# Patient Record
Sex: Female | Born: 1960 | Race: White | Hispanic: No | Marital: Married | Smoking: Never smoker
Health system: Southern US, Community
[De-identification: ages and names within clinical notes are randomized; demographics above are authoritative.]

## PROBLEM LIST (undated history)

## (undated) DIAGNOSIS — M1990 Unspecified osteoarthritis, unspecified site: Secondary | ICD-10-CM

## (undated) DIAGNOSIS — O24419 Gestational diabetes mellitus in pregnancy, unspecified control: Secondary | ICD-10-CM

## (undated) DIAGNOSIS — F419 Anxiety disorder, unspecified: Secondary | ICD-10-CM

## (undated) DIAGNOSIS — J45909 Unspecified asthma, uncomplicated: Secondary | ICD-10-CM

## (undated) DIAGNOSIS — K582 Mixed irritable bowel syndrome: Secondary | ICD-10-CM

## (undated) DIAGNOSIS — K589 Irritable bowel syndrome without diarrhea: Secondary | ICD-10-CM

## (undated) DIAGNOSIS — K219 Gastro-esophageal reflux disease without esophagitis: Secondary | ICD-10-CM

## (undated) HISTORY — PX: WISDOM TOOTH EXTRACTION: SHX21

## (undated) HISTORY — DX: Mixed irritable bowel syndrome: K58.2

## (undated) HISTORY — DX: Irritable bowel syndrome, unspecified: K58.9

## (undated) HISTORY — DX: Gastro-esophageal reflux disease without esophagitis: K21.9

## (undated) HISTORY — PX: TUBAL LIGATION: SHX77

## (undated) HISTORY — PX: BREAST EXCISIONAL BIOPSY: SUR124

## (undated) HISTORY — PX: REPLACEMENT TOTAL KNEE: SUR1224

---

## 2007-08-19 ENCOUNTER — Emergency Department (HOSPITAL_COMMUNITY): Admission: EM | Admit: 2007-08-19 | Discharge: 2007-08-19 | Payer: Self-pay | Admitting: Emergency Medicine

## 2015-04-05 ENCOUNTER — Encounter (HOSPITAL_COMMUNITY): Payer: Self-pay | Admitting: Emergency Medicine

## 2015-04-05 ENCOUNTER — Emergency Department (HOSPITAL_COMMUNITY)
Admission: EM | Admit: 2015-04-05 | Discharge: 2015-04-05 | Disposition: A | Discharge status: Home / Self Care | Payer: Self-pay | Attending: Emergency Medicine | Admitting: Emergency Medicine

## 2015-04-05 DIAGNOSIS — J45909 Unspecified asthma, uncomplicated: Secondary | ICD-10-CM | POA: Insufficient documentation

## 2015-04-05 DIAGNOSIS — K047 Periapical abscess without sinus: Secondary | ICD-10-CM | POA: Insufficient documentation

## 2015-04-05 DIAGNOSIS — R220 Localized swelling, mass and lump, head: Secondary | ICD-10-CM

## 2015-04-05 DIAGNOSIS — K029 Dental caries, unspecified: Secondary | ICD-10-CM | POA: Insufficient documentation

## 2015-04-05 DIAGNOSIS — M1990 Unspecified osteoarthritis, unspecified site: Secondary | ICD-10-CM | POA: Insufficient documentation

## 2015-04-05 HISTORY — DX: Unspecified osteoarthritis, unspecified site: M19.90

## 2015-04-05 HISTORY — DX: Unspecified asthma, uncomplicated: J45.909

## 2015-04-05 MED ORDER — HYDROCODONE-ACETAMINOPHEN 5-325 MG PO TABS: 2.0000 | ORAL_TABLET | ORAL | Status: DC | PRN

## 2015-04-05 MED ORDER — CLINDAMYCIN HCL 300 MG PO CAPS: 300.0000 mg | ORAL_CAPSULE | Freq: Three times a day (TID) | ORAL | Status: DC

## 2015-04-05 MED ORDER — CLINDAMYCIN HCL 150 MG PO CAPS
300.0000 mg | ORAL_CAPSULE | Freq: Once | ORAL | Status: AC
  Administered 2015-04-05: 300 mg via ORAL
  Filled 2015-04-05: qty 2

## 2015-04-05 MED ORDER — HYDROCODONE-ACETAMINOPHEN 5-325 MG PO TABS
1.0000 | ORAL_TABLET | Freq: Once | ORAL | Status: AC
Start: 2015-04-05 — End: 2015-04-05
  Administered 2015-04-05: 1 via ORAL
  Filled 2015-04-05: qty 1

## 2015-04-05 NOTE — Discharge Instructions (Signed)
Facial Infection °You have an infection of your face. This requires special attention to help prevent serious problems. Infections in facial wounds can cause poor healing and scars. They can also spread to deeper tissues, especially around the eye. Wound and dental infections can lead to sinusitis, infection of the eye socket, and even meningitis. Permanent damage to the skin, eye, and nervous system may result if facial infections are not treated properly. With severe infections, hospital care for IV antibiotic injections may be needed if they don't respond to oral antibiotics. °Antibiotics must be taken for the full course to insure the infection is eliminated. If the infection came from a bad tooth, it may have to be extracted when the infection is under control. Warm compresses may be applied to reduce skin irritation and remove drainage. °You might need a tetanus shot now if: °· You cannot remember when your last tetanus shot was. °· You have never had a tetanus shot. °· The object that caused your wound was dirty. °If you need a tetanus shot, and you decide not to get one, there is a rare chance of getting tetanus. Sickness from tetanus can be serious. If you got a tetanus shot, your arm may swell, get red and warm to the touch at the shot site. This is common and not a problem. °SEEK IMMEDIATE MEDICAL CARE IF:  °· You have increased swelling, redness, or trouble breathing. °· You have a severe headache, dizziness, nausea, or vomiting. °· You develop problems with your eyesight. °· You have a fever. °Document Released: 08/12/2004 Document Revised: 09/27/2011 Document Reviewed: 07/05/2005 °ExitCare® Patient Information ©2015 [employer]. This information is not intended to replace advice given to you by your health care provider. Make sure you discuss any questions you have with your health care provider. ° °

## 2015-04-05 NOTE — ED Notes (Signed)
Patient with intermittent jaw swelling/pain since Tuesday. States teeth do not hurt, but has poor dentition. Patient denies trouble swallowing, trouble speaking. Afebrile.

## 2015-04-05 NOTE — ED Provider Notes (Signed)
CSN: [id number]     Arrival date & time 04/05/15  0865 History  This chart was scribed for Rolland Porter, MD by Ronney Lion, ED Scribe. This patient was seen in room APA12/APA12 and the patient's care was started at 8:37 AM.    Chief Complaint  Patient presents with  . Jaw Pain   The history is provided by the patient. No language interpreter was used.   HPI Comments: Kathy Perry is a 54 y.o. female who presents to the Emergency Department complaining of intermittent left-sided jaw swelling, occasionally radiating to her left ear, that began 4 days ago. She endorses an associated subjective fever, stating that she occasionally feels hot and diaphoretic, for which she has taken Motrin. Patient has known allergies to codeine.  Past Medical History  Diagnosis Date  . Arthritis   . Asthma    Past Surgical History  Procedure Laterality Date  . Tubal ligation    . Wisdom tooth extraction     No family history on file. Social History  Substance Use Topics  . Smoking status: Passive Smoke Exposure - Never Smoker  . Smokeless tobacco: None  . Alcohol Use: No   OB History    No data available     Review of Systems  Constitutional: Positive for fever (subjective). Negative for chills, diaphoresis, appetite change and fatigue.  HENT: Positive for facial swelling. Negative for mouth sores, sore throat and trouble swallowing.   Eyes: Negative for visual disturbance.  Respiratory: Negative for cough, chest tightness, shortness of breath and wheezing.   Cardiovascular: Negative for chest pain.  Gastrointestinal: Negative for nausea, vomiting, abdominal pain, diarrhea and abdominal distention.  Endocrine: Negative for polydipsia, polyphagia and polyuria.  Genitourinary: Negative for dysuria, frequency and hematuria.  Musculoskeletal: Negative for gait problem.  Skin: Negative for color change, pallor and rash.  Neurological: Negative for dizziness, syncope, light-headedness and headaches.   Hematological: Does not bruise/bleed easily.  Psychiatric/Behavioral: Negative for behavioral problems and confusion.      Allergies  Codeine  Home Medications   Prior to Admission medications   Medication Sig Start Date End Date Taking? Authorizing Provider  clindamycin (CLEOCIN) 300 MG capsule Take 1 capsule (300 mg total) by mouth 3 (three) times daily. 04/05/15   Rolland Porter, MD  HYDROcodone-acetaminophen (NORCO/VICODIN) 5-325 MG per tablet Take 2 tablets by mouth every 4 (four) hours as needed. 04/05/15   Rolland Porter, MD   BP 147/91 mmHg  Pulse 102  Temp(Src) 98.6 F (37 C) (Oral)  Resp 16  Ht 5' 5.5" (1.664 m)  Wt 180 lb (81.647 kg)  BMI 29.49 kg/m2  SpO2 99%  LMP  (LMP Unknown) Physical Exam  Constitutional: She is oriented to person, place, and time. She appears well-developed and well-nourished. No distress.  HENT:  Head: Normocephalic and atraumatic.  Obvious cavity of second mandibular molar on left, with surrounding gingival swelling. However, most swelling appears to be in the left maxilla. Swelling along left jaw.  Eyes: Conjunctivae and EOM are normal. Pupils are equal, round, and reactive to light. No scleral icterus.  Neck: Normal range of motion. Neck supple. No tracheal deviation present. No thyromegaly present.  Normal neck, no swelling.  Cardiovascular: Normal rate and regular rhythm.  Exam reveals no gallop and no friction rub.   No murmur heard. Pulmonary/Chest: Effort normal and breath sounds normal. No respiratory distress. She has no wheezes. She has no rales.  Abdominal: Soft. Bowel sounds are normal. She exhibits no distension.  There is no tenderness. There is no rebound.  Musculoskeletal: Normal range of motion.  Neurological: She is alert and oriented to person, place, and time.  Skin: Skin is warm and dry. No rash noted.  Psychiatric: She has a normal mood and affect. Her behavior is normal.  Nursing note and vitals reviewed.   ED Course   Procedures (including critical care time)  DIAGNOSTIC STUDIES: Oxygen Saturation is 99% on RA, normal by my interpretation.    COORDINATION OF CARE: 8:40 AM - Discussed treatment plan with pt at bedside. Pt verbalized understanding and agreed to plan.   MDM   Final diagnoses:  Dental infection  Facial swelling    No obvious abscess. Plan will be clindamycin out of Vicodin. Continue oral hygiene. Recheck with failure to improve.  I personally performed the services described in this documentation, which was scribed in my presence. The recorded information has been reviewed and is accurate.     Rolland Porter, MD 04/05/15 [phone]

## 2018-01-08 ENCOUNTER — Other Ambulatory Visit: Payer: Self-pay

## 2018-01-08 ENCOUNTER — Emergency Department (HOSPITAL_COMMUNITY): Payer: Medicaid Other

## 2018-01-08 ENCOUNTER — Emergency Department (HOSPITAL_COMMUNITY)
Admission: EM | Admit: 2018-01-08 | Discharge: 2018-01-08 | Disposition: A | Discharge status: Home / Self Care | Payer: Medicaid Other | Attending: Emergency Medicine | Admitting: Emergency Medicine

## 2018-01-08 ENCOUNTER — Encounter (HOSPITAL_COMMUNITY): Payer: Self-pay | Admitting: Emergency Medicine

## 2018-01-08 DIAGNOSIS — Z7722 Contact with and (suspected) exposure to environmental tobacco smoke (acute) (chronic): Secondary | ICD-10-CM | POA: Diagnosis not present

## 2018-01-08 DIAGNOSIS — I442 Atrioventricular block, complete: Secondary | ICD-10-CM

## 2018-01-08 DIAGNOSIS — J45909 Unspecified asthma, uncomplicated: Secondary | ICD-10-CM | POA: Diagnosis not present

## 2018-01-08 DIAGNOSIS — R002 Palpitations: Secondary | ICD-10-CM | POA: Insufficient documentation

## 2018-01-08 DIAGNOSIS — I498 Other specified cardiac arrhythmias: Secondary | ICD-10-CM | POA: Diagnosis not present

## 2018-01-08 HISTORY — DX: Gestational diabetes mellitus in pregnancy, unspecified control: O24.419

## 2018-01-08 LAB — I-STAT TROPONIN, ED: TROPONIN I, POC: 0 ng/mL (ref 0.00–0.08)

## 2018-01-08 LAB — CBC WITH DIFFERENTIAL/PLATELET
BASOS ABS: 0.1 10*3/uL (ref 0.0–0.1)
Basophils Relative: 2 %
EOS ABS: 0.2 10*3/uL (ref 0.0–0.7)
EOS PCT: 3 %
HCT: 45 % (ref 36.0–46.0)
Hemoglobin: 14.8 g/dL (ref 12.0–15.0)
LYMPHS PCT: 28 %
Lymphs Abs: 1.5 10*3/uL (ref 0.7–4.0)
MCH: 29.2 pg (ref 26.0–34.0)
MCHC: 32.9 g/dL (ref 30.0–36.0)
MCV: 88.9 fL (ref 78.0–100.0)
Monocytes Absolute: 0.3 10*3/uL (ref 0.1–1.0)
Monocytes Relative: 6 %
Neutro Abs: 3.2 10*3/uL (ref 1.7–7.7)
Neutrophils Relative %: 61 %
PLATELETS: 139 10*3/uL — AB (ref 150–400)
RBC: 5.06 MIL/uL (ref 3.87–5.11)
RDW: 13.3 % (ref 11.5–15.5)
WBC: 5.3 10*3/uL (ref 4.0–10.5)

## 2018-01-08 LAB — BASIC METABOLIC PANEL
Anion gap: 6 (ref 5–15)
BUN: 18 mg/dL (ref 6–20)
CO2: 28 mmol/L (ref 22–32)
Calcium: 9.4 mg/dL (ref 8.9–10.3)
Chloride: 106 mmol/L (ref 101–111)
Creatinine, Ser: 0.98 mg/dL (ref 0.44–1.00)
GFR calc Af Amer: >60 mL/min (ref >60)
Glucose, Bld: 92 mg/dL (ref 65–99)
POTASSIUM: 4 mmol/L (ref 3.5–5.1)
SODIUM: 140 mmol/L (ref 135–145)

## 2018-01-08 LAB — TSH: TSH: 0.704 u[IU]/mL (ref 0.350–4.500)

## 2018-01-08 LAB — MAGNESIUM: MAGNESIUM: 2 mg/dL (ref 1.7–2.4)

## 2018-01-08 MED ORDER — SODIUM CHLORIDE 0.9 % IV BOLUS
500.0000 mL | Freq: Once | INTRAVENOUS | Status: AC
  Administered 2018-01-08: 500 mL via INTRAVENOUS

## 2018-01-08 MED ORDER — LORAZEPAM 2 MG/ML IJ SOLN: 0.5000 mg | Freq: Once | INTRAMUSCULAR | Status: DC

## 2018-01-08 NOTE — ED Notes (Signed)
IVF infusing

## 2018-01-08 NOTE — ED Notes (Signed)
Pt to rad

## 2018-01-08 NOTE — ED Notes (Signed)
Awaiting completion of fluids

## 2018-01-08 NOTE — ED Triage Notes (Signed)
Patient c/o intermittent rapid heart beat that started yesterday. Denies any shortness of breath or chest pain. Patient does report tremors and weakness. Denies any cardiac hx. Per patient has been taking medications for multiple days due to headache.

## 2018-01-08 NOTE — ED Notes (Signed)
Pt reports that she feels like her heart is racing  Reports she was not "doing anything" when she felt it racing Has happened in the past  Has not seen a physician due to report os having no physician  IV, labs

## 2018-01-08 NOTE — Discharge Instructions (Signed)
You were seen in the ED today with heart palpitations. Call to make an appointment with the Cardiologist listed. Return to the ED with any chest pain, difficulty breathing, passing out, lightheadedness, or worsening palpitations. Cut back on caffeine and drink more water.

## 2018-01-08 NOTE — ED Provider Notes (Signed)
Emergency Department Provider Note   I have reviewed the triage vital signs and the nursing notes.   HISTORY  Chief Complaint Tachycardia   HPI Kathy Perry is a 57 y.o. female with PMH of arthritis and asthma's to the emergency department for evaluation of intermittent heart palpitations.  She has had occasional symptoms in the past but since yesterday has had more frequent and severe symptoms.  She denies any associated chest pain or shortness of breath.  She feels some generalized weakness at times.  She did have a headache earlier in the week and was taking a lot of Goody's powder along with other over-the-counter medications and the headache is now gone.  She has no prior evaluation for her palpitations.  She states she does drink a lot of caffeinated soda not good about drinking water.   Past Medical History:  Diagnosis Date  . Arthritis   . Asthma   . Gestational diabetes     There are no active problems to display for this patient.   Past Surgical History:  Procedure Laterality Date  . TUBAL LIGATION    . WISDOM TOOTH EXTRACTION      Allergies Codeine  Family History  Problem Relation Age of Onset  . Diabetes Father     Social History Social History   Tobacco Use  . Smoking status: Passive Smoke Exposure - Never Smoker  . Smokeless tobacco: Never Used  Substance Use Topics  . Alcohol use: No  . Drug use: No    Review of Systems  Constitutional: No fever/chills Eyes: No visual changes. ENT: No sore throat. Cardiovascular: Denies chest pain. Positive heart palpitations.  Respiratory: Denies shortness of breath. Gastrointestinal: No abdominal pain.  No nausea, no vomiting.  No diarrhea.  No constipation. Genitourinary: Negative for dysuria. Musculoskeletal: Negative for back pain. Skin: Negative for rash. Neurological: Negative for headaches, focal weakness or numbness.  10-point ROS otherwise  negative.  ____________________________________________   PHYSICAL EXAM:  VITAL SIGNS: ED Triage Vitals  Enc Vitals Group     BP 01/08/18 1432 (!) 153/95     Pulse Rate 01/08/18 1432 85     Resp 01/08/18 1432 18     Temp 01/08/18 1432 98.1 F (36.7 C)     Temp Source 01/08/18 1432 Oral     SpO2 01/08/18 1432 100 %     Weight 01/08/18 1429 180 lb (81.6 kg)     Height 01/08/18 1429 5' 5.5" (1.664 m)     Pain Score 01/08/18 1428 1   Constitutional: Alert and oriented. Well appearing and in no acute distress. Eyes: Conjunctivae are normal. Head: Atraumatic. Nose: No congestion/rhinnorhea. Mouth/Throat: Mucous membranes are moist.  Oropharynx non-erythematous. Neck: No stridor.  Cardiovascular: Normal rate, regular rhythm. Good peripheral circulation. Grossly normal heart sounds.   Respiratory: Normal respiratory effort.  No retractions. Lungs CTAB. Gastrointestinal: Soft and nontender. No distention.  Musculoskeletal: No lower extremity tenderness nor edema. No gross deformities of extremities. Neurologic:  Normal speech and language. No gross focal neurologic deficits are appreciated.  Skin:  Skin is warm, dry and intact. No rash noted.  ____________________________________________   LABS (all labs ordered are listed, but only abnormal results are displayed)  Labs Reviewed  CBC WITH DIFFERENTIAL/PLATELET - Abnormal; Notable for the following components:      Result Value   Platelets 139 (*)    All other components within normal limits  BASIC METABOLIC PANEL  TSH  MAGNESIUM  I-STAT TROPONIN, ED  ____________________________________________  EKG   EKG Interpretation  Date/Time:  Sunday January 08 2018 14:33:04 EDT Ventricular Rate:  82 PR Interval:    QRS Duration: 107 QT Interval:  379 QTC Calculation: 443 R Axis:   26 Text Interpretation:  Sinus rhythm Abnormal R-wave progression, early transition No STEMI.  Confirmed by Alona BeneLong, Dilpreet Faires [phone] on 01/08/2018  3:06:34 PM         ____________________________________________  RADIOLOGY  Dg Chest 2 View  Result Date: 01/08/2018 CLINICAL DATA:  Heart palpitations and strange feeling in throat. EXAM: CHEST - 2 VIEW COMPARISON:  None. FINDINGS: Lungs are adequately inflated without focal consolidation or effusion. Cardiomediastinal silhouette is within normal. There is mild biphasic curvature of the thoracolumbar spine. Minimal degenerate change of the spine. IMPRESSION: No active cardiopulmonary disease. Electronically Signed   By: Elberta Fortisaniel  Boyle M.D.   On: 01/08/2018 15:54    ____________________________________________   PROCEDURES  Procedure(s) performed:   Procedures  None ____________________________________________   INITIAL IMPRESSION / ASSESSMENT AND PLAN / ED COURSE  Pertinent labs & imaging results that were available during my care of the patient were reviewed by me and considered in my medical decision making (see chart for details).  Patient presents to the emergency department with frequent heart palpitations worsening since yesterday.  She has a normal EKG on arrival but when I am in the room she has frequent PVCs with occasional episodes where she will have up to 7 or 8 PVCs in a row.  She feels symptomatic during this episode and it was captured on telemetry.  04:22 PM Reviewed the patient's EKG and telemetry strip with Dr. Donnie Ahoilley, with cardiology.  His interpretation as the patient is having a PVC followed by an idioventricular rhythm.  The rate is slow.  Not concerning for ventricular tachycardia.  Labs reviewed with no acute findings.  TSH is normal.  Chest x-ray with no acute findings.  Plan for close cardiology follow-up.   At this time, I do not feel there is any life-threatening condition present. I have reviewed and discussed all results (EKG, imaging, lab, urine as appropriate), exam findings with patient. I have reviewed nursing notes and appropriate previous  records.  I feel the patient is safe to be discharged home without further emergent workup. Discussed usual and customary return precautions. Patient and family (if present) verbalize understanding and are comfortable with this plan.  Patient will follow-up with their primary care provider. If they do not have a primary care provider, information for follow-up has been provided to them. All questions have been answered.  ____________________________________________  FINAL CLINICAL IMPRESSION(S) / ED DIAGNOSES  Final diagnoses:  Palpitations  Idioventricular rhythm (HCC)     MEDICATIONS GIVEN DURING THIS VISIT:  Medications  sodium chloride 0.9 % bolus 500 mL (500 mLs Intravenous New Bag/Given 01/08/18 1546)     Note:  This document was prepared using Dragon voice recognition software and may include unintentional dictation errors.  Alona BeneJoshua Vivien Barretto, MD Emergency Medicine    Savva Beamer, Arlyss RepressJoshua G, MD 01/08/18 [phone]

## 2018-01-13 ENCOUNTER — Encounter: Payer: Self-pay | Admitting: Cardiovascular Disease

## 2018-01-13 ENCOUNTER — Ambulatory Visit (INDEPENDENT_AMBULATORY_CARE_PROVIDER_SITE_OTHER): Payer: Medicaid Other | Admitting: Cardiovascular Disease

## 2018-01-13 VITALS — BP 139/70 | HR 86 | Ht 65.5 in | Wt 184.0 lb

## 2018-01-13 DIAGNOSIS — R002 Palpitations: Secondary | ICD-10-CM | POA: Diagnosis not present

## 2018-01-13 DIAGNOSIS — I442 Atrioventricular block, complete: Secondary | ICD-10-CM | POA: Diagnosis not present

## 2018-01-13 DIAGNOSIS — Z9289 Personal history of other medical treatment: Secondary | ICD-10-CM

## 2018-01-13 DIAGNOSIS — I493 Ventricular premature depolarization: Secondary | ICD-10-CM | POA: Diagnosis not present

## 2018-01-13 NOTE — Patient Instructions (Addendum)
Your physician wants you to follow-up ZO:XWRUin:next available   with Dr.Koneswaran     Your physician has recommended that you wear an event monitor for 2 weeks. Event monitors are medical devices that record the heart's electrical activity. Doctors most often us these monitors to diagnose arrhythmias. Arrhythmias are problems with the speed or rhythm of the heartbeat. The monitor is a small, portable device. You can wear one while you do your normal daily activities. This is usually used to diagnose what is causing palpitations/syncope (passing out).    Your physician has requested that you have an echocardiogram. Echocardiography is a painless test that uses sound waves to create images of your heart. It provides your doctor with information about the size and shape of your heart and how well your heart's chambers and valves are working. This procedure takes approximately one hour. There are no restrictions for this procedure.     Your physician recommends that you continue on your current medications as directed. Please refer to the Current Medication list given to you today.    If you need a refill on your cardiac medications before your next appointment, please call your pharmacy.      No lab ordered today       Thank you for choosing [facility] !

## 2018-01-13 NOTE — Progress Notes (Signed)
CARDIOLOGY CONSULT NOTE  Patient ID: Kathy Perry MRN: [id number] DOB/AGE: 1961-03-17 57 y.o.  Admit date: (Not on file) Primary Physician: Patient, No Pcp Per Referring Physician: Dr. Laverta Baltimore  Reason for Consultation: Tachycardia and palpitations  HPI: Kathy Perry is a 57 y.o. female who is being seen today for the evaluation of tachycardia and palpitations at the request of Long, Wonda Olds, MD.   She was evaluated in the ED on 01/08/2018.  I personally reviewed all relevant duct mentation, labs, and studies.  There is no associated chest pain or shortness of breath.  Blood pressure was 153/95 upon arrival with a heart rate of 85 bpm.  Chest x-ray showed no active cardiopulmonary disease.  I reviewed the telemetry strips which demonstrated frequent PVCs with up to 7 rate PVCs in a row.  She was symptomatic with this. There were also PVCs followed by an idioventricular rhythm.  There is no evidence of ventricular tachycardia.  TSH was normal.  Troponin, magnesium, TSH, and basic met about panel were all normal.  There was mild thrombocytopenia, platelets 139.  I personally reviewed the ECG performed on 01/08/2018 which showed normal sinus rhythm with no ischemic ST segment or T wave ab normalities, nor any arrhythmias.  She denies chest pain, shortness of breath, leg swelling, orthopnea, and paroxysmal nocturnal dyspnea.  She denies dizziness and syncope.  The day she presented to the ED, she had a severe headache.  She said the back of her legs ache when she goes from the sitting to standing position ever since Sunday.  She never drank coffee and had an occasional soda but used to use a lot of Goody powder 2-3 times per day.  She has experienced palpitations for years.     Allergies  Allergen Reactions  . Codeine     No current outpatient medications on file.   No current facility-administered medications for this visit.     Past Medical History:  Diagnosis Date    . Arthritis   . Asthma   . Gestational diabetes     Past Surgical History:  Procedure Laterality Date  . TUBAL LIGATION    . WISDOM TOOTH EXTRACTION      Social History   Socioeconomic History  . Marital status: Married    Spouse name: Not on file  . Number of children: Not on file  . Years of education: Not on file  . Highest education level: Not on file  Occupational History  . Not on file  Social Needs  . Financial resource strain: Not on file  . Food insecurity:    Worry: Not on file    Inability: Not on file  . Transportation needs:    Medical: Not on file    Non-medical: Not on file  Tobacco Use  . Smoking status: Passive Smoke Exposure - Never Smoker  . Smokeless tobacco: Never Used  Substance and Sexual Activity  . Alcohol use: No  . Drug use: No  . Sexual activity: Yes  Lifestyle  . Physical activity:    Days per week: Not on file    Minutes per session: Not on file  . Stress: Not on file  Relationships  . Social connections:    Talks on phone: Not on file    Gets together: Not on file    Attends religious service: Not on file    Active member of club or organization: Not on file    Attends  meetings of clubs or organizations: Not on file    Relationship status: Not on file  . Intimate partner violence:    Fear of current or ex partner: Not on file    Emotionally abused: Not on file    Physically abused: Not on file    Forced sexual activity: Not on file  Other Topics Concern  . Not on file  Social History Narrative  . Not on file     No family history of premature CAD in 1st degree relatives.  No outpatient medications have been marked as taking for the 01/13/18 encounter (Office Visit) with Herminio Commons, MD.      Review of systems complete and found to be negative unless listed above in HPI    Physical exam Blood pressure 139/70, pulse 86, height 5' 5.5" (1.664 m), weight 184 lb (83.5 kg), SpO2 98 %. General: NAD Neck: No  JVD, no thyromegaly or thyroid nodule.  Lungs: Clear to auscultation bilaterally with normal respiratory effort. CV: Nondisplaced PMI. Regular rate and rhythm, normal S1/S2, no S3/S4, no murmur.  No peripheral edema.  No carotid bruit.    Abdomen: Soft, nontender, no distention.  Skin: Intact without lesions or rashes.  Neurologic: Alert and oriented x 3.  Psych: Normal affect. Extremities: No clubbing or cyanosis.  HEENT: Normal.   ECG: Most recent ECG reviewed.   Labs: Lab Results  Component Value Date/Time   K 4.0 01/08/2018 03:10 PM   BUN 18 01/08/2018 03:10 PM   CREATININE 0.98 01/08/2018 03:10 PM   TSH 0.704 01/08/2018 03:10 PM   HGB 14.8 01/08/2018 03:10 PM     Lipids: No results found for: LDLCALC, LDLDIRECT, CHOL, TRIG, HDL      ASSESSMENT AND PLAN:  1.  Palpitations with symptomatic PVCs and idioventricular rhythm: Unclear etiology at this time.  There is no family history of heart disease.  I will obtain an echocardiogram to evaluate cardiac structure and function.  I will obtain a 2-week event monitor for further clarification.     Disposition: Follow up in 2 to 3 months  Signed: Kate Sable, M.D., F.A.C.C.  01/13/2018, 10:26 AM

## 2018-01-18 ENCOUNTER — Ambulatory Visit (INDEPENDENT_AMBULATORY_CARE_PROVIDER_SITE_OTHER): Payer: Medicaid Other

## 2018-01-18 ENCOUNTER — Ambulatory Visit (HOSPITAL_COMMUNITY)
Admission: RE | Admit: 2018-01-18 | Discharge: 2018-01-18 | Disposition: A | Discharge status: Home / Self Care | Payer: Medicaid Other | Source: Ambulatory Visit | Attending: Cardiovascular Disease | Admitting: Cardiovascular Disease

## 2018-01-18 DIAGNOSIS — I493 Ventricular premature depolarization: Secondary | ICD-10-CM | POA: Diagnosis not present

## 2018-01-18 DIAGNOSIS — R002 Palpitations: Secondary | ICD-10-CM

## 2018-01-18 DIAGNOSIS — I340 Nonrheumatic mitral (valve) insufficiency: Secondary | ICD-10-CM | POA: Diagnosis not present

## 2018-01-18 NOTE — Progress Notes (Signed)
*  PRELIMINARY RESULTS* Echocardiogram 2D Echocardiogram has been performed.  Jeryl Columbialliott, Brendt Dible 01/18/2018, 10:36 AM

## 2018-02-14 ENCOUNTER — Ambulatory Visit: Payer: Self-pay | Admitting: Cardiovascular Disease

## 2018-04-19 ENCOUNTER — Encounter: Payer: Self-pay | Admitting: Cardiovascular Disease

## 2018-04-19 ENCOUNTER — Ambulatory Visit (INDEPENDENT_AMBULATORY_CARE_PROVIDER_SITE_OTHER): Payer: Medicaid Other | Admitting: Cardiovascular Disease

## 2018-04-19 VITALS — BP 130/80 | HR 81 | Ht 65.5 in | Wt 185.0 lb

## 2018-04-19 DIAGNOSIS — R002 Palpitations: Secondary | ICD-10-CM

## 2018-04-19 DIAGNOSIS — I493 Ventricular premature depolarization: Secondary | ICD-10-CM | POA: Diagnosis not present

## 2018-04-19 DIAGNOSIS — I340 Nonrheumatic mitral (valve) insufficiency: Secondary | ICD-10-CM | POA: Diagnosis not present

## 2018-04-19 NOTE — Patient Instructions (Addendum)
Your physician wants you to follow-up in: 1 year  with Dr.Koneswaran You will receive a reminder letter in the mail two months in advance. If you don't receive a letter, please call our office to schedule the follow-up appointment.     Your physician recommends that you continue on your current medications as directed. Please refer to the Current Medication list given to you today.    If you need a refill on your cardiac medications before your next appointment, please call your pharmacy.     [facility]  [phone], call for an apt      No lab work or tests ordered today       Thank you for choosing [facility] !

## 2018-04-19 NOTE — Progress Notes (Signed)
SUBJECTIVE: The patient presents for follow-up of arrhythmia. Echocardiogram 01/18/2018 demonstrated normal left ventricular systolic function and regional wall motion, LVEF 60 to 65%, grade 1 diastolic dysfunction, and mild to moderate mitral regurgitation.  Event monitoring demonstrated sinus rhythm with isolated PACs and a short 4 beat run of wide-complex tachycardia.  She denies chest pain.  She occasionally has some shortness of breath which can occur at rest.  Palpitations have significantly improved.  She has had some exertional fatigue.  She still does not have a PCP.     Review of Systems: As per "subjective", otherwise negative.  Allergies  Allergen Reactions  . Codeine     No current outpatient medications on file.   No current facility-administered medications for this visit.     Past Medical History:  Diagnosis Date  . Arthritis   . Asthma   . Gestational diabetes     Past Surgical History:  Procedure Laterality Date  . TUBAL LIGATION    . WISDOM TOOTH EXTRACTION      Social History   Socioeconomic History  . Marital status: Married    Spouse name: Not on file  . Number of children: Not on file  . Years of education: Not on file  . Highest education level: Not on file  Occupational History  . Not on file  Social Needs  . Financial resource strain: Not on file  . Food insecurity:    Worry: Not on file    Inability: Not on file  . Transportation needs:    Medical: Not on file    Non-medical: Not on file  Tobacco Use  . Smoking status: Passive Smoke Exposure - Never Smoker  . Smokeless tobacco: Never Used  Substance and Sexual Activity  . Alcohol use: No  . Drug use: No  . Sexual activity: Yes  Lifestyle  . Physical activity:    Days per week: Not on file    Minutes per session: Not on file  . Stress: Not on file  Relationships  . Social connections:    Talks on phone: Not on file    Gets together: Not on file    Attends  religious service: Not on file    Active member of club or organization: Not on file    Attends meetings of clubs or organizations: Not on file    Relationship status: Not on file  . Intimate partner violence:    Fear of current or ex partner: Not on file    Emotionally abused: Not on file    Physically abused: Not on file    Forced sexual activity: Not on file  Other Topics Concern  . Not on file  Social History Narrative  . Not on file     Vitals:   04/19/18 0956  BP: 130/80  Pulse: 81  SpO2: 98%  Weight: 185 lb (83.9 kg)  Height: 5' 5.5" (1.664 m)    Wt Readings from Last 3 Encounters:  04/19/18 185 lb (83.9 kg)  01/13/18 184 lb (83.5 kg)  01/08/18 180 lb (81.6 kg)     PHYSICAL EXAM General: NAD HEENT: Normal. Neck: No JVD, no thyromegaly. Lungs: Clear to auscultation bilaterally with normal respiratory effort. CV: Regular rate and rhythm, normal S1/S2, no S3/S4, no murmur. No pretibial or periankle edema.  No carotid bruit.   Abdomen: Soft, nontender, no distention.  Neurologic: Alert and oriented.  Psych: Normal affect. Skin: Normal. Musculoskeletal: No gross deformities.  ECG: Reviewed above under Subjective   Labs: Lab Results  Component Value Date/Time   K 4.0 01/08/2018 03:10 PM   BUN 18 01/08/2018 03:10 PM   CREATININE 0.98 01/08/2018 03:10 PM   TSH 0.704 01/08/2018 03:10 PM   HGB 14.8 01/08/2018 03:10 PM     Lipids: No results found for: LDLCALC, LDLDIRECT, CHOL, TRIG, HDL     ASSESSMENT AND PLAN: 1.  Palpitations/symptomatic PVCs: Echocardiogram and event monitor reviewed above.  Symptomatically improved.  I will monitor.  2.  Mitral regurgitation: Mild to moderate in severity by echocardiogram in July 2019 as reviewed above.  I will monitor.    Disposition: Follow up 1 year   Prentice Docker, M.D., F.A.C.C.

## 2018-07-24 ENCOUNTER — Encounter (INDEPENDENT_AMBULATORY_CARE_PROVIDER_SITE_OTHER): Payer: Self-pay

## 2018-07-24 ENCOUNTER — Other Ambulatory Visit (HOSPITAL_COMMUNITY): Payer: Self-pay | Admitting: Adult Health

## 2018-07-24 ENCOUNTER — Encounter: Payer: Self-pay | Admitting: Adult Health

## 2018-07-24 ENCOUNTER — Ambulatory Visit: Payer: Medicaid Other | Admitting: Adult Health

## 2018-07-24 ENCOUNTER — Other Ambulatory Visit (HOSPITAL_COMMUNITY)
Admission: RE | Admit: 2018-07-24 | Discharge: 2018-07-24 | Disposition: A | Discharge status: Home / Self Care | Payer: Medicaid Other | Source: Ambulatory Visit | Attending: Adult Health | Admitting: Adult Health

## 2018-07-24 VITALS — BP 120/80 | HR 80 | Ht 65.2 in | Wt 181.0 lb

## 2018-07-24 DIAGNOSIS — Z01419 Encounter for gynecological examination (general) (routine) without abnormal findings: Secondary | ICD-10-CM | POA: Diagnosis not present

## 2018-07-24 DIAGNOSIS — Z0000 Encounter for general adult medical examination without abnormal findings: Secondary | ICD-10-CM

## 2018-07-24 DIAGNOSIS — Z1212 Encounter for screening for malignant neoplasm of rectum: Secondary | ICD-10-CM

## 2018-07-24 DIAGNOSIS — Z1211 Encounter for screening for malignant neoplasm of colon: Secondary | ICD-10-CM

## 2018-07-24 DIAGNOSIS — Z1231 Encounter for screening mammogram for malignant neoplasm of breast: Secondary | ICD-10-CM

## 2018-07-24 LAB — HEMOCCULT GUIAC POC 1CARD (OFFICE): Fecal Occult Blood, POC: NEGATIVE

## 2018-07-24 NOTE — Addendum Note (Signed)
Addended by: Federico Flake A on: 07/24/2018 10:40 AM   Modules accepted: Orders

## 2018-07-24 NOTE — Progress Notes (Signed)
Patient ID: Kathy Perry, female   DOB: 1961/05/09, 58 y.o.   MRN: [id number] History of Present Illness: Kathy Perry is a 58 year old white female, married, G3P3, PM, in for new pt, well woman gyn exam and pap. Husband is disabled and is alcoholic. She is a Futures trader.  PCP is [facility].    Current Medications, Allergies, Past Medical History, Past Surgical History, Family History and Social History were reviewed in [employer] record.     Review of Systems: Patient denies any headaches, hearing loss, fatigue, blurred vision, shortness of breath, chest pain, abdominal pain, problems with bowel movements, urination(has some SUI at times), or intercourse(not active). No joint pain or mood swings. Breast sore at times, has never at mammogram. She has seen Dr. Purvis Sheffield in the fall for palpitations, had PVCs.   Physical Exam:BP 120/80 (BP Location: Left Arm, Patient Position: Sitting, Cuff Size: Normal)   Pulse 80   Ht 5' 5.2" (1.656 m)   Wt 181 lb (82.1 kg)   LMP  (LMP Unknown)   BMI 29.94 kg/m  General:  Well developed, well nourished, no acute distress Skin:  Warm and dry Neck:  Midline trachea, normal thyroid, good ROM, no lymphadenopathy Lungs; Clear to auscultation bilaterally Breast:  No dominant palpable mass, retraction, or nipple discharge Cardiovascular: Regular rate and rhythm Abdomen:  Soft, non tender, no hepatosplenomegaly Pelvic:  External genitalia is normal in appearance, no lesions. Has small sin tags on inner thighs. The vagina is normal in appearance, for age with loss of color, moisture and rugae. Urethra has no lesions or masses. The cervix is smooth, pap with HPV performed.  Uterus is felt to be normal size, shape, and contour.  No adnexal masses or tenderness noted.Bladder is non tender, no masses felt. Rectal: Good sphincter tone, no polyps, or hemorrhoids felt.  Hemoccult negative. Extremities/musculoskeletal:  No swelling or  varicosities noted, no clubbing or cyanosis Psych:  No mood changes, alert and cooperative,seems happy PHQ 2 score 0. Fall risk is low. Examination chaperoned by Federico Flake, CMA.   Impression: 1. Encounter for gynecological examination with Papanicolaou smear of cervix   2. Screening for colorectal cancer       Plan: Pap with HPV sent Screening Mammogram appt made for her 1/9 at 11:45 at [facility] Referred to Dr Darrick Penna for screening colonoscopy  Labs with new PCP Physical in 1 year Pap in 3 if normal

## 2018-07-25 LAB — CYTOLOGY - PAP
Diagnosis: NEGATIVE
HPV (WINDOPATH): NOT DETECTED

## 2018-07-26 DIAGNOSIS — Z1159 Encounter for screening for other viral diseases: Secondary | ICD-10-CM | POA: Diagnosis not present

## 2018-07-26 DIAGNOSIS — R1310 Dysphagia, unspecified: Secondary | ICD-10-CM | POA: Diagnosis not present

## 2018-07-26 DIAGNOSIS — Z0000 Encounter for general adult medical examination without abnormal findings: Secondary | ICD-10-CM | POA: Diagnosis not present

## 2018-07-26 DIAGNOSIS — Z7689 Persons encountering health services in other specified circumstances: Secondary | ICD-10-CM | POA: Diagnosis not present

## 2018-07-27 ENCOUNTER — Telehealth: Payer: Self-pay

## 2018-07-27 ENCOUNTER — Ambulatory Visit (HOSPITAL_COMMUNITY)
Admission: RE | Admit: 2018-07-27 | Discharge: 2018-07-27 | Disposition: A | Discharge status: Home / Self Care | Payer: Medicaid Other | Source: Ambulatory Visit | Attending: Adult Health | Admitting: Adult Health

## 2018-07-27 ENCOUNTER — Encounter (HOSPITAL_COMMUNITY): Payer: Self-pay

## 2018-07-27 DIAGNOSIS — Z1231 Encounter for screening mammogram for malignant neoplasm of breast: Secondary | ICD-10-CM | POA: Insufficient documentation

## 2018-07-27 NOTE — Telephone Encounter (Signed)
Opened in error

## 2018-07-28 ENCOUNTER — Other Ambulatory Visit: Payer: Self-pay

## 2018-07-28 ENCOUNTER — Encounter: Payer: Self-pay | Admitting: Gastroenterology

## 2018-07-28 ENCOUNTER — Ambulatory Visit: Payer: Medicaid Other | Admitting: Gastroenterology

## 2018-07-28 VITALS — BP 121/84 | HR 83 | Temp 97.1°F | Ht 65.5 in | Wt 182.0 lb

## 2018-07-28 DIAGNOSIS — Z1211 Encounter for screening for malignant neoplasm of colon: Secondary | ICD-10-CM

## 2018-07-28 DIAGNOSIS — R1319 Other dysphagia: Secondary | ICD-10-CM | POA: Insufficient documentation

## 2018-07-28 DIAGNOSIS — R1310 Dysphagia, unspecified: Secondary | ICD-10-CM

## 2018-07-28 MED ORDER — CLENPIQ 10-3.5-12 MG-GM -GM/160ML PO SOLN: 1.0000 | Freq: Once | ORAL | 0 refills | Status: AC

## 2018-07-28 NOTE — Patient Instructions (Signed)
1. Colonoscopy and upper endoscopy as scheduled. Please see separate instructions. 

## 2018-07-28 NOTE — Assessment & Plan Note (Signed)
Solid food/liquid dysphagia of 2 years duration.  No typical heartburn.  Plan for EGD with esophageal dilation in the near future.  I have discussed the risks, alternatives, benefits with regards to but not limited to the risk of reaction to medication, bleeding, infection, perforation and the patient is agreeable to proceed. Written consent to be obtained.

## 2018-07-28 NOTE — Assessment & Plan Note (Signed)
Plan for first ever screening colonoscopy in the near future.  I have discussed the risks, alternatives, benefits with regards to but not limited to the risk of reaction to medication, bleeding, infection, perforation and the patient is agreeable to proceed. Written consent to be obtained.  

## 2018-07-28 NOTE — Progress Notes (Addendum)
Primary Care Physician:  Domenick Bookbinder, PA-C Referring Provider: Cyril Mourning, NP Primary Gastroenterologist:  Jonette Eva, MD REVIEWED. Pt needs to BE SEEN AS AN OPV PRIOR TO [facility] SHE CANCELED SAME DAY.  Chief Complaint  Patient presents with  . Consult    TCS. Never had one prior  . Dysphagia    requests EGD. reports she has to stretch her neck when she eats for it to go down but then feels like food is stuck in throat    HPI:  Kathy Perry is a 58 y.o. female here at the request of Cyril Mourning, NP to schedule screening colonoscopy.  Patient has never had a colonoscopy.  Recently Hemoccult negative. Patient saw her PCP two days ago and mentioned dysphagia. They recommended her letting us know so we could address at time of her colonoscopy. She has never had EGD.  She denies heartburn symptoms.  For the past couple years she has noted issues swallowing.  Often will feel food stick in the center of her chest as well as in the throat area at times.  She has to wait for her to go down or sometimes she is able to wash it down.  Sometimes it happens with drinking liquids.  When all this happens, she will often stretch her neck out to see if this will help things go down.  Denies any abdominal pain.  In the remote past she was diagnosed with IBS-D.  Really has not had any significant problems lately.  Bowel movements are pretty regular although on occasion she can go a week without a stool but when she finally goes the stool is soft and she will have several.  Denies blood in the stool or melena.  She has been able to avoid trigger foods to control her symptoms. Weight stable.    No current outpatient medications on file.   No current facility-administered medications for this visit.     Allergies as of 07/28/2018 - Review Complete 07/28/2018  Allergen Reaction Noted  . Codeine  04/05/2015    Past Medical History:  Diagnosis Date  . Arthritis   . Asthma   . Gestational diabetes    . IBS (irritable bowel syndrome)     Past Surgical History:  Procedure Laterality Date  . TUBAL LIGATION    . WISDOM TOOTH EXTRACTION      Family History  Problem Relation Age of Onset  . Diabetes Father   . Cancer Father        not sure primary  . Breast cancer Paternal Grandmother   . Breast cancer Paternal Aunt   . Lung cancer Paternal Aunt   . Colon cancer Neg Hx     Social History   Socioeconomic History  . Marital status: Married    Spouse name: Not on file  . Number of children: 3  . Years of education: Not on file  . Highest education level: 9th grade  Occupational History  . Occupation: homemaker  Social Needs  . Financial resource strain: Not on file  . Food insecurity:    Worry: Never true    Inability: Never true  . Transportation needs:    Medical: No    Non-medical: No  Tobacco Use  . Smoking status: Never Smoker  . Smokeless tobacco: Never Used  Substance and Sexual Activity  . Alcohol use: No  . Drug use: No  . Sexual activity: Not Currently    Birth control/protection: Surgical, Post-menopausal  Comment: tubal  Lifestyle  . Physical activity:    Days per week: 0 days    Minutes per session: Not on file  . Stress: Very much  Relationships  . Social connections:    Talks on phone: More than three times a week    Gets together: Once a week    Attends religious service: Never    Active member of club or organization: No    Attends meetings of clubs or organizations: Never    Relationship status: Married  . Intimate partner violence:    Fear of current or ex partner: No    Emotionally abused: No    Physically abused: No    Forced sexual activity: No  Other Topics Concern  . Not on file  Social History Narrative  . Not on file      ROS:  General: Negative for anorexia, weight loss, fever, chills, fatigue, weakness. Eyes: Negative for vision changes.  ENT: Negative for hoarseness, difficulty swallowing , nasal congestion. CV:  Negative for chest pain, angina, palpitations, dyspnea on exertion, peripheral edema.  Respiratory: Negative for dyspnea at rest, dyspnea on exertion, cough, sputum, wheezing.  GI: See history of present illness. GU:  Negative for dysuria, hematuria, urinary incontinence, urinary frequency, nocturnal urination.  MS: Negative for joint pain, low back pain.  Derm: Negative for rash or itching.  Neuro: Negative for weakness, abnormal sensation, seizure, frequent headaches, memory loss, confusion.  Psych: Negative for anxiety, depression, suicidal ideation, hallucinations.  Endo: Negative for unusual weight change.  Heme: Negative for bruising or bleeding. Allergy: Negative for rash or hives.    Physical Examination:  BP 121/84   Pulse 83   Temp (!) 97.1 F (36.2 C) (Oral)   Ht 5' 5.5" (1.664 m)   Wt 182 lb (82.6 kg)   LMP  (LMP Unknown)   BMI 29.83 kg/m    General: Well-nourished, well-developed in no acute distress.  Head: Normocephalic, atraumatic.   Eyes: Conjunctiva pink, no icterus. Mouth: Oropharyngeal mucosa moist and pink , no lesions erythema or exudate. Neck: Supple without thyromegaly, masses, or lymphadenopathy.  Lungs: Clear to auscultation bilaterally.  Heart: Regular rate and rhythm, no murmurs rubs or gallops.  Abdomen: Bowel sounds are normal, nontender, nondistended, no hepatosplenomegaly or masses, no abdominal bruits or    hernia , no rebound or guarding.   Rectal: not performed Extremities: No lower extremity edema. No clubbing or deformities.  Neuro: Alert and oriented x 4 , grossly normal neurologically.  Skin: Warm and dry, no rash or jaundice.   Psych: Alert and cooperative, normal mood and affect.  Labs: Labs from July 26, 2018 White blood cell count 5100, hemoglobin 15.1, platelets 175,000, glucose 92, BUN 14, creatinine 1, potassium 4.2, albumin 4.7, total bilirubin 0.4, alkaline phosphatase 95, AST 21, ALT 21, hepatitis C antibody negative, total  cholesterol 241, LDL 146, HDL 61.  Imaging Studies: Mm 3d Screen Breast Bilateral  Result Date: 07/27/2018 CLINICAL DATA:  Screening. EXAM: DIGITAL SCREENING BILATERAL MAMMOGRAM WITH TOMO AND CAD COMPARISON:  None. ACR Breast Density Category b: There are scattered areas of fibroglandular density. FINDINGS: There are no findings suspicious for malignancy. Images were processed with CAD. IMPRESSION: No mammographic evidence of malignancy. A result letter of this screening mammogram will be mailed directly to the patient. RECOMMENDATION: Screening mammogram in one year. (Code:SM-B-01Y) BI-RADS CATEGORY  1: Negative. Electronically Signed   By: Norva Pavlov M.D.   On: 07/27/2018 16:33

## 2018-07-31 ENCOUNTER — Other Ambulatory Visit: Payer: Self-pay

## 2018-07-31 DIAGNOSIS — R1310 Dysphagia, unspecified: Secondary | ICD-10-CM

## 2018-07-31 DIAGNOSIS — Z1211 Encounter for screening for malignant neoplasm of colon: Secondary | ICD-10-CM

## 2018-07-31 DIAGNOSIS — R1319 Other dysphagia: Secondary | ICD-10-CM

## 2018-07-31 NOTE — Progress Notes (Signed)
CC'D TO PCP °

## 2018-08-28 ENCOUNTER — Ambulatory Visit: Payer: Medicaid Other

## 2018-09-29 ENCOUNTER — Encounter (HOSPITAL_COMMUNITY): Admission: RE | Payer: Self-pay | Source: Home / Self Care

## 2018-09-29 ENCOUNTER — Ambulatory Visit (HOSPITAL_COMMUNITY): Admission: RE | Admit: 2018-09-29 | Payer: Medicaid Other | Source: Home / Self Care | Admitting: Gastroenterology

## 2018-09-29 ENCOUNTER — Encounter: Payer: Self-pay | Admitting: Gastroenterology

## 2018-09-29 SURGERY — COLONOSCOPY
Anesthesia: Moderate Sedation

## 2018-12-13 ENCOUNTER — Emergency Department (HOSPITAL_COMMUNITY)
Admission: EM | Admit: 2018-12-13 | Discharge: 2018-12-13 | Disposition: A | Discharge status: Home / Self Care | Payer: Medicaid Other | Attending: Emergency Medicine | Admitting: Emergency Medicine

## 2018-12-13 ENCOUNTER — Emergency Department (HOSPITAL_COMMUNITY): Payer: Medicaid Other

## 2018-12-13 ENCOUNTER — Encounter (HOSPITAL_COMMUNITY): Payer: Self-pay | Admitting: Emergency Medicine

## 2018-12-13 ENCOUNTER — Other Ambulatory Visit: Payer: Self-pay

## 2018-12-13 DIAGNOSIS — Z7982 Long term (current) use of aspirin: Secondary | ICD-10-CM | POA: Insufficient documentation

## 2018-12-13 DIAGNOSIS — R401 Stupor: Secondary | ICD-10-CM | POA: Diagnosis not present

## 2018-12-13 DIAGNOSIS — Z79899 Other long term (current) drug therapy: Secondary | ICD-10-CM | POA: Insufficient documentation

## 2018-12-13 DIAGNOSIS — J45909 Unspecified asthma, uncomplicated: Secondary | ICD-10-CM | POA: Diagnosis not present

## 2018-12-13 DIAGNOSIS — R42 Dizziness and giddiness: Secondary | ICD-10-CM | POA: Diagnosis not present

## 2018-12-13 DIAGNOSIS — R000 Tachycardia, unspecified: Secondary | ICD-10-CM | POA: Diagnosis not present

## 2018-12-13 LAB — CBC WITH DIFFERENTIAL/PLATELET
Abs Immature Granulocytes: 0.01 10*3/uL (ref 0.00–0.07)
Basophils Absolute: 0.1 10*3/uL (ref 0.0–0.1)
Basophils Relative: 2 %
Eosinophils Absolute: 0.1 10*3/uL (ref 0.0–0.5)
Eosinophils Relative: 3 %
HCT: 45.2 % (ref 36.0–46.0)
Hemoglobin: 14.9 g/dL (ref 12.0–15.0)
Immature Granulocytes: 0 %
Lymphocytes Relative: 31 %
Lymphs Abs: 1.4 10*3/uL (ref 0.7–4.0)
MCH: 29.2 pg (ref 26.0–34.0)
MCHC: 33 g/dL (ref 30.0–36.0)
MCV: 88.6 fL (ref 80.0–100.0)
Monocytes Absolute: 0.3 10*3/uL (ref 0.1–1.0)
Monocytes Relative: 7 %
Neutro Abs: 2.6 10*3/uL (ref 1.7–7.7)
Neutrophils Relative %: 57 %
Platelets: 149 10*3/uL — ABNORMAL LOW (ref 150–400)
RBC: 5.1 MIL/uL (ref 3.87–5.11)
RDW: 13.3 % (ref 11.5–15.5)
WBC: 4.5 10*3/uL (ref 4.0–10.5)
nRBC: 0 % (ref 0.0–0.2)

## 2018-12-13 LAB — URINALYSIS, ROUTINE W REFLEX MICROSCOPIC
Bacteria, UA: NONE SEEN
Bilirubin Urine: NEGATIVE
Glucose, UA: NEGATIVE mg/dL
Ketones, ur: NEGATIVE mg/dL
Leukocytes,Ua: NEGATIVE
Nitrite: NEGATIVE
Protein, ur: NEGATIVE mg/dL
Specific Gravity, Urine: 1.004 — ABNORMAL LOW (ref 1.005–1.030)
pH: 8 (ref 5.0–8.0)

## 2018-12-13 LAB — COMPREHENSIVE METABOLIC PANEL
ALT: 28 U/L (ref 0–44)
AST: 21 U/L (ref 15–41)
Albumin: 4.3 g/dL (ref 3.5–5.0)
Alkaline Phosphatase: 98 U/L (ref 38–126)
Anion gap: 13 (ref 5–15)
BUN: 12 mg/dL (ref 6–20)
CO2: 24 mmol/L (ref 22–32)
Calcium: 8.8 mg/dL — ABNORMAL LOW (ref 8.9–10.3)
Chloride: 106 mmol/L (ref 98–111)
Creatinine, Ser: 0.85 mg/dL (ref 0.44–1.00)
GFR calc Af Amer: >60 mL/min (ref >60)
GFR calc non Af Amer: >60 mL/min (ref >60)
Glucose, Bld: 107 mg/dL — ABNORMAL HIGH (ref 70–99)
Potassium: 4.3 mmol/L (ref 3.5–5.1)
Sodium: 143 mmol/L (ref 135–145)
Total Bilirubin: 0.5 mg/dL (ref 0.3–1.2)
Total Protein: 7.7 g/dL (ref 6.5–8.1)

## 2018-12-13 LAB — CBG MONITORING, ED: Glucose-Capillary: 78 mg/dL (ref 70–99)

## 2018-12-13 MED ORDER — SODIUM CHLORIDE 0.9 % IV BOLUS
1000.0000 mL | Freq: Once | INTRAVENOUS | Status: AC
  Administered 2018-12-13: 11:00:00 1000 mL via INTRAVENOUS

## 2018-12-13 MED ORDER — MECLIZINE HCL 25 MG PO TABS: 25.0000 mg | ORAL_TABLET | Freq: Three times a day (TID) | ORAL | 0 refills | Status: DC | PRN

## 2018-12-13 MED ORDER — MECLIZINE HCL 12.5 MG PO TABS
25.0000 mg | ORAL_TABLET | Freq: Once | ORAL | Status: AC
  Administered 2018-12-13: 13:00:00 25 mg via ORAL
  Filled 2018-12-13: qty 2

## 2018-12-13 MED ORDER — ONDANSETRON 4 MG PO TBDP: 4.0000 mg | ORAL_TABLET | Freq: Three times a day (TID) | ORAL | 0 refills | Status: DC | PRN

## 2018-12-13 MED ORDER — SODIUM CHLORIDE 0.9 % IV SOLN
INTRAVENOUS | Status: DC
  Administered 2018-12-13: 12:00:00 via INTRAVENOUS

## 2018-12-13 NOTE — ED Provider Notes (Signed)
[facility]   CSN: [id number] Arrival date & time: 12/13/18  1003    History   Chief Complaint Chief Complaint  Patient presents with  . Dizziness    HPI Kathy RowerSusan K Perry is a 58 y.o. female.     Pt presents to the ED today after a syncopal event this morning.  Pt said she woke up this morning, sat up, became very dizzy and sweaty and nauseous.  She then fell back onto the bed.  She feels dizzy when she stands up.  She does not feel dizzy now in bed.  She denies worsening of sx with head movement.  She said she has ringing in her ears, but that is normal for her.  She denies cp, sob, or f/c.  No known covid exposures.  She's been staying home.       Past Medical History:  Diagnosis Date  . Arthritis   . Asthma   . Gestational diabetes   . IBS (irritable bowel syndrome)     Patient Active Problem List   Diagnosis Date Noted  . Esophageal dysphagia 07/28/2018  . Encounter for screening colonoscopy 07/28/2018    Past Surgical History:  Procedure Laterality Date  . TUBAL LIGATION    . WISDOM TOOTH EXTRACTION       OB History    Gravida  3   Para  3   Term  2   Preterm  1   AB      Living  3     SAB      TAB      Ectopic      Multiple      Live Births               Home Medications    Prior to Admission medications   Medication Sig Start Date End Date Taking? Authorizing Provider  Aspirin-Acetaminophen-Caffeine (GOODY HEADACHE PO) Take 1 packet by mouth daily as needed (joint pain).    Yes [provider]  pseudoephedrine-acetaminophen (TYLENOL SINUS) 30-500 MG TABS tablet Take 1 tablet by mouth every 4 (four) hours as needed (congestion).   Yes [provider]  meclizine (ANTIVERT) 25 MG tablet Take 1 tablet (25 mg total) by mouth 3 (three) times daily as needed for dizziness. 12/13/18   Jacalyn LefevreHaviland, Juanitta Earnhardt, MD  ondansetron (ZOFRAN ODT) 4 MG disintegrating tablet Take 1 tablet (4 mg total) by  mouth every 8 (eight) hours as needed. 12/13/18   Jacalyn LefevreHaviland, Nakima Fluegge, MD    Family History Family History  Problem Relation Age of Onset  . Diabetes Father   . Cancer Father        not sure primary  . Breast cancer Paternal Grandmother   . Breast cancer Paternal Aunt   . Lung cancer Paternal Aunt   . Colon cancer Neg Hx     Social History Social History   Tobacco Use  . Smoking status: Never Smoker  . Smokeless tobacco: Never Used  Substance Use Topics  . Alcohol use: No  . Drug use: No     Allergies   Codeine   Review of Systems Review of Systems  Neurological: Positive for light-headedness.  All other systems reviewed and are negative.    Physical Exam Updated Vital Signs BP (!) 146/92   Pulse 89   Temp 98.2 F (36.8 C) (Oral)   Resp (!) 23   Ht 5' 5.5" (1.664 m)   Wt 82.6 kg   LMP  (  LMP Unknown)   SpO2 99%   BMI 29.83 kg/m   Physical Exam Vitals signs and nursing note reviewed.  Constitutional:      Appearance: Normal appearance.  HENT:     Head: Normocephalic and atraumatic.     Right Ear: External ear normal.     Left Ear: External ear normal.     Nose: Nose normal.     Mouth/Throat:     Mouth: Mucous membranes are moist.     Pharynx: Oropharynx is clear.  Eyes:     Extraocular Movements: Extraocular movements intact.     Conjunctiva/sclera: Conjunctivae normal.     Pupils: Pupils are equal, round, and reactive to light.  Neck:     Musculoskeletal: Normal range of motion and neck supple.  Cardiovascular:     Rate and Rhythm: Normal rate and regular rhythm.     Pulses: Normal pulses.     Heart sounds: Normal heart sounds.  Pulmonary:     Effort: Pulmonary effort is normal.     Breath sounds: Normal breath sounds.  Abdominal:     General: Abdomen is flat. Bowel sounds are normal.     Palpations: Abdomen is soft.  Musculoskeletal: Normal range of motion.  Skin:    General: Skin is warm and dry.     Capillary Refill: Capillary refill  takes less than 2 seconds.  Neurological:     General: No focal deficit present.     Mental Status: She is alert and oriented to person, place, and time.  Psychiatric:        Mood and Affect: Mood normal.        Behavior: Behavior normal.        Thought Content: Thought content normal.        Judgment: Judgment normal.      ED Treatments / Results  Labs (all labs ordered are listed, but only abnormal results are displayed) Labs Reviewed  CBC WITH DIFFERENTIAL/PLATELET - Abnormal; Notable for the following components:      Result Value   Platelets 149 (*)    All other components within normal limits  URINALYSIS, ROUTINE W REFLEX MICROSCOPIC - Abnormal; Notable for the following components:   Color, Urine COLORLESS (*)    Specific Gravity, Urine 1.004 (*)    Hgb urine dipstick SMALL (*)    All other components within normal limits  COMPREHENSIVE METABOLIC PANEL - Abnormal; Notable for the following components:   Glucose, Bld 107 (*)    Calcium 8.8 (*)    All other components within normal limits  CBG MONITORING, ED    EKG EKG Interpretation  Date/Time:  Wednesday Dec 13 2018 10:28:10 EDT Ventricular Rate:  89 PR Interval:    QRS Duration: 98 QT Interval:  369 QTC Calculation: 449 R Axis:   49 Text Interpretation:  Sinus rhythm Abnormal R-wave progression, early transition Borderline repolarization abnormality Baseline wander in lead(s) V1 V2 No significant change since last tracing Confirmed by Jacalyn Lefevre [id number]) on 12/13/2018 10:39:41 AM   Radiology Ct Head Wo Contrast  Result Date: 12/13/2018 CLINICAL DATA:  Pt c/o dizziness one time this am when she sat up in the bed. Pt states that has been going on for about 1-2 years. Altered level of consciousness (LOC), unexplained EXAM: CT HEAD WITHOUT CONTRAST TECHNIQUE: Contiguous axial images were obtained from the base of the skull through the vertex without intravenous contrast. COMPARISON:  None. FINDINGS: Brain: No  acute intracranial hemorrhage. No focal mass lesion.  No CT evidence of acute infarction. No midline shift or mass effect. No hydrocephalus. Basilar cisterns are patent. Vascular: No hyperdense vessel or unexpected calcification. Skull: Normal. Negative for fracture or focal lesion. Sinuses/Orbits: Paranasal sinuses and mastoid air cells are clear. Orbits are clear. Other: None. IMPRESSION: Normal head CT. Electronically Signed   By: Genevive Bi M.D.   On: 12/13/2018 12:41   Dg Chest Port 1 View  Result Date: 12/13/2018 CLINICAL DATA:  Tachycardia. EXAM: PORTABLE CHEST 1 VIEW COMPARISON:  01/08/2018 FINDINGS: The cardiac silhouette, mediastinal and hilar contours are within normal limits and stable. Mild tortuosity of the thoracic aorta likely due to the patient's scoliosis. The lungs are clear. No infiltrates, edema or effusions. No worrisome pulmonary lesions. The bony thorax is intact. IMPRESSION: No acute cardiopulmonary findings. Electronically Signed   By: Rudie Meyer M.D.   On: 12/13/2018 11:00    Procedures Procedures (including critical care time)  Medications Ordered in ED Medications  sodium chloride 0.9 % bolus 1,000 mL (0 mLs Intravenous Stopped 12/13/18 1207)    And  0.9 %  sodium chloride infusion ( Intravenous New Bag/Given 12/13/18 1206)  meclizine (ANTIVERT) tablet 25 mg (has no administration in time range)     Initial Impression / Assessment and Plan / ED Course  I have reviewed the triage vital signs and the nursing notes.  Pertinent labs & imaging results that were available during my care of the patient were reviewed by me and considered in my medical decision making (see chart for details).    Pt is feeling much better.  Labs and CT scan reviewed.  The pt will occ get dizzy if she turns her head a specific way.  Pt will go home with antivert and instructions on how to do the Epley maneuver.  She is instructed to f/u with ENT if sx do not improve.  Return if worse.   Final Clinical Impressions(s) / ED Diagnoses   Final diagnoses:  Vertigo    ED Discharge Orders         Ordered    meclizine (ANTIVERT) 25 MG tablet  3 times daily PRN     12/13/18 1252    ondansetron (ZOFRAN ODT) 4 MG disintegrating tablet  Every 8 hours PRN     12/13/18 1252           Jacalyn Lefevre, MD 12/13/18 1254

## 2018-12-13 NOTE — ED Triage Notes (Signed)
PT states when she woke up this morning she sat on the side of the bed and became dizzy, diaphoretic and feel over back onto the bed. PT c/o dizziness and feeling unsteady during orthostatic vital signs during triage.

## 2018-12-14 ENCOUNTER — Ambulatory Visit: Payer: Medicaid Other | Admitting: Gastroenterology

## 2018-12-14 ENCOUNTER — Telehealth: Payer: Self-pay

## 2018-12-14 NOTE — Telephone Encounter (Signed)
PATIENT HUSBAND CALLED AND RESCHEDULED HER APPOINTMENT

## 2018-12-14 NOTE — Telephone Encounter (Signed)
Tried to call pt at 0805 to start telephone visit w/AB, no answer, LMOVM for return call. Home number listed busy. No return call as of 8:15am.   Misty Stanley, please no show pt.

## 2018-12-21 DIAGNOSIS — R42 Dizziness and giddiness: Secondary | ICD-10-CM | POA: Diagnosis not present

## 2018-12-21 DIAGNOSIS — Z7689 Persons encountering health services in other specified circumstances: Secondary | ICD-10-CM | POA: Diagnosis not present

## 2019-01-30 DIAGNOSIS — H8111 Benign paroxysmal vertigo, right ear: Secondary | ICD-10-CM | POA: Insufficient documentation

## 2019-01-30 DIAGNOSIS — R42 Dizziness and giddiness: Secondary | ICD-10-CM | POA: Diagnosis not present

## 2019-01-30 HISTORY — DX: Benign paroxysmal vertigo, right ear: H81.11

## 2019-02-16 DIAGNOSIS — R42 Dizziness and giddiness: Secondary | ICD-10-CM | POA: Diagnosis not present

## 2019-02-21 ENCOUNTER — Other Ambulatory Visit: Payer: Self-pay

## 2019-02-21 ENCOUNTER — Telehealth: Payer: Self-pay

## 2019-02-21 ENCOUNTER — Ambulatory Visit: Payer: Medicaid Other | Admitting: Gastroenterology

## 2019-02-21 ENCOUNTER — Encounter: Payer: Self-pay | Admitting: Gastroenterology

## 2019-02-21 VITALS — BP 134/87 | HR 89 | Temp 97.1°F | Ht 65.5 in | Wt 184.4 lb

## 2019-02-21 DIAGNOSIS — Z1211 Encounter for screening for malignant neoplasm of colon: Secondary | ICD-10-CM

## 2019-02-21 DIAGNOSIS — R1310 Dysphagia, unspecified: Secondary | ICD-10-CM | POA: Diagnosis not present

## 2019-02-21 DIAGNOSIS — R1319 Other dysphagia: Secondary | ICD-10-CM

## 2019-02-21 NOTE — Patient Instructions (Signed)
We will arrange the cologuard test for you.  You will need an upper endoscopy with dilation in the future with Dr. Oneida Alar.  Further recommendations to follow!  It was a pleasure to see you today. I strive to create trusting relationships with patients to provide genuine, compassionate, and quality care. I value your feedback. If you receive a survey regarding your visit,  I greatly appreciate you taking time to fill this out.   Annitta Needs, PhD, ANP-BC [facility]

## 2019-02-21 NOTE — Assessment & Plan Note (Signed)
Chronic intermittent solid food and occasional liquid dysphagia without typical GERD symptoms. No PPI currently and declining PPI. Needs EGD/dilatation in future. Will await results of Cologuard and then schedule accordingly.

## 2019-02-21 NOTE — Telephone Encounter (Signed)
I have faxed the request for the cologuard.

## 2019-02-21 NOTE — Assessment & Plan Note (Addendum)
58 year old female with need for initial screening colonoscopy but developed a severe migraine with last low-dose prep attempt. She is requesting Cologuard test instead at this time, but she is willing to pursue if positive. Discussed that this is not a replacement for screening colonoscopy, and she is aware. She is also aware if Cologuard is negative and no colonoscopy completed, that there is no way to definitely know if any occult malignancy or polyps. Proceed with Cologuard first.   Addendum: cologuard negative. Patient declining colonoscopy. Proceed with EGD/dilatation only as planned.

## 2019-02-21 NOTE — Progress Notes (Signed)
Referring Provider: Domenick BookbinderHess, Kristin M, PA-C Primary Care Physician:  Domenick BookbinderHess, Kristin M, PA-C  Primary GI: Dr. Darrick PennaFields   Chief Complaint  Patient presents with  . Dysphagia    "comes/goes"  . Consult    TCS. patient developed migraine when prepping last time and caused her to throw up her prep    HPI:   Kathy Perry is a 58 y.o. female presenting today to reschedule initial screening colonoscopy and EGD due to dysphagia. Previously attempted prep and cancelled day of procedure, as she developed a migraine and was vomiting prep.   Would like to do Cologuard first. Mild headache prior to prep. Throughout the day before had worsening migraine. Day of prep severe migraine. No concerning lower GI signs/symptoms.   Dysphagia for several years. Intermittent. Sometimes will feel sore and then go away. Chews food well. Could choke on her own spit. No typical GERD. Declining PPI.   Past Medical History:  Diagnosis Date  . Arthritis   . Asthma   . Gestational diabetes   . IBS (irritable bowel syndrome)     Past Surgical History:  Procedure Laterality Date  . TUBAL LIGATION    . WISDOM TOOTH EXTRACTION      Current Outpatient Medications  Medication Sig Dispense Refill  . Aspirin-Acetaminophen-Caffeine (GOODY HEADACHE PO) Take 1 packet by mouth daily as needed (joint pain).     . meclizine (ANTIVERT) 25 MG tablet Take 1 tablet (25 mg total) by mouth 3 (three) times daily as needed for dizziness. 30 tablet 0  . ondansetron (ZOFRAN ODT) 4 MG disintegrating tablet Take 1 tablet (4 mg total) by mouth every 8 (eight) hours as needed. 10 tablet 0  . pseudoephedrine-acetaminophen (TYLENOL SINUS) 30-500 MG TABS tablet Take 1 tablet by mouth every 4 (four) hours as needed (congestion).     No current facility-administered medications for this visit.     Allergies as of 02/21/2019 - Review Complete 02/21/2019  Allergen Reaction Noted  . Codeine Other (See Comments) 04/05/2015    Family  History  Problem Relation Age of Onset  . Diabetes Father   . Cancer Father        not sure primary  . Breast cancer Paternal Grandmother   . Breast cancer Paternal Aunt   . Lung cancer Paternal Aunt   . Colon cancer Neg Hx     Social History   Socioeconomic History  . Marital status: Married    Spouse name: Not on file  . Number of children: 3  . Years of education: Not on file  . Highest education level: 9th grade  Occupational History  . Occupation: homemaker  Social Needs  . Financial resource strain: Not on file  . Food insecurity    Worry: Never true    Inability: Never true  . Transportation needs    Medical: No    Non-medical: No  Tobacco Use  . Smoking status: Never Smoker  . Smokeless tobacco: Never Used  Substance and Sexual Activity  . Alcohol use: No  . Drug use: No  . Sexual activity: Not Currently    Birth control/protection: Surgical, Post-menopausal    Comment: tubal  Lifestyle  . Physical activity    Days per week: 0 days    Minutes per session: Not on file  . Stress: Very much  Relationships  . Social connections    Talks on phone: More than three times a week    Gets together: Once a week  Attends religious service: Never    Active member of club or organization: No    Attends meetings of clubs or organizations: Never    Relationship status: Married  Other Topics Concern  . Not on file  Social History Narrative  . Not on file    Review of Systems: Gen: Denies fever, chills, anorexia. Denies fatigue, weakness, weight loss.  CV: Denies chest pain, palpitations, syncope, peripheral edema, and claudication. Resp: Denies dyspnea at rest, cough, wheezing, coughing up blood, and pleurisy. GI: see HPI Derm: Denies rash, itching, dry skin Psych: Denies depression, anxiety, memory loss, confusion. No homicidal or suicidal ideation.  Heme: Denies bruising, bleeding, and enlarged lymph nodes.  Physical Exam: BP 134/87   Pulse 89   Temp  (!) 97.1 F (36.2 C) (Oral)   Ht 5' 5.5" (1.664 m)   Wt 184 lb 6.4 oz (83.6 kg)   LMP  (LMP Unknown)   BMI 30.22 kg/m  General:   Alert and oriented. No distress noted. Pleasant and cooperative.  Head:  Normocephalic and atraumatic. Eyes:  Conjuctiva clear without scleral icterus. Cardiac: S1 S2 present without murmurs Lung: clear bilaterally Abdomen:  +BS, soft, non-tender and non-distended. No rebound or guarding. No HSM or masses noted. Msk:  Symmetrical without gross deformities. Normal posture. Extremities:  Without edema. Neurologic:  Alert and  oriented x4 Psych:  Alert and cooperative. Normal mood and affect.

## 2019-02-22 NOTE — Progress Notes (Signed)
CC'D TO PCP °

## 2019-03-07 ENCOUNTER — Encounter: Payer: Self-pay | Admitting: Gastroenterology

## 2019-03-07 DIAGNOSIS — Z1211 Encounter for screening for malignant neoplasm of colon: Secondary | ICD-10-CM | POA: Diagnosis not present

## 2019-03-15 LAB — COLOGUARD: Cologuard: NEGATIVE

## 2019-03-22 NOTE — Telephone Encounter (Signed)
I called Cologuard and spoke to Slayden. She said the test was negative and she had faxed it over on 03/15/2019. She is refaxing to [place].

## 2019-03-22 NOTE — Telephone Encounter (Signed)
PT said she did it about a week or two ago. I will call and check on it.

## 2019-03-22 NOTE — Telephone Encounter (Signed)
Received the fax of negative results and placed in [employer].

## 2019-03-22 NOTE — Telephone Encounter (Signed)
Kathy Perry: can we check to see if patient has completed this yet?

## 2019-03-23 NOTE — Telephone Encounter (Signed)
Cologuard negative. Is she still declining colonoscopy? If so, still needs to pursue EGD/dilatation with Dr. Oneida Alar due to dysphagia.

## 2019-03-27 ENCOUNTER — Telehealth: Payer: Self-pay | Admitting: Gastroenterology

## 2019-03-27 ENCOUNTER — Other Ambulatory Visit: Payer: Self-pay | Admitting: *Deleted

## 2019-03-27 DIAGNOSIS — R1310 Dysphagia, unspecified: Secondary | ICD-10-CM

## 2019-03-27 DIAGNOSIS — R1319 Other dysphagia: Secondary | ICD-10-CM

## 2019-03-27 NOTE — Telephone Encounter (Signed)
Called patient. She is scheduled for EGD/DIL 10/30 at 2:30pm. Patient aware will need COVID-19 testing. Scheduled for 10/28 at 3:25pm. Patient aware of location and will need to quarantine at home after testing. Orders entered. Instructions mailed.

## 2019-03-27 NOTE — Telephone Encounter (Signed)
Pt was returning a call. [phone]

## 2019-03-27 NOTE — Telephone Encounter (Signed)
PT said OK to schedule the EGD, but she does not want to the do the colonoscopy now.

## 2019-03-27 NOTE — Telephone Encounter (Signed)
See separate note, I have spoken to pt.  

## 2019-03-27 NOTE — Telephone Encounter (Signed)
I called pt and she is at the grocery store and will call to discuss when she gets home.

## 2019-05-16 ENCOUNTER — Other Ambulatory Visit: Payer: Self-pay

## 2019-05-16 ENCOUNTER — Other Ambulatory Visit (HOSPITAL_COMMUNITY)
Admission: RE | Admit: 2019-05-16 | Discharge: 2019-05-16 | Disposition: A | Discharge status: Home / Self Care | Payer: Medicaid Other | Source: Ambulatory Visit | Attending: Gastroenterology | Admitting: Gastroenterology

## 2019-05-16 ENCOUNTER — Telehealth: Payer: Self-pay | Admitting: Gastroenterology

## 2019-05-16 NOTE — Telephone Encounter (Signed)
Noted called endo and procedure cancelled. FYI to AB

## 2019-05-16 NOTE — Telephone Encounter (Signed)
Pt's husband called to say patient had a family emergency and she will be out of town for a week or so and needed to cancel her procedure with [facility] on Friday.

## 2019-05-18 ENCOUNTER — Ambulatory Visit (HOSPITAL_COMMUNITY): Admission: RE | Admit: 2019-05-18 | Payer: Medicaid Other | Source: Home / Self Care | Admitting: Gastroenterology

## 2019-05-18 ENCOUNTER — Encounter (HOSPITAL_COMMUNITY): Admission: RE | Payer: Self-pay | Source: Home / Self Care

## 2019-05-18 SURGERY — EGD (ESOPHAGOGASTRODUODENOSCOPY)
Anesthesia: Moderate Sedation

## 2019-05-28 ENCOUNTER — Telehealth: Payer: Self-pay | Admitting: Cardiovascular Disease

## 2019-05-28 NOTE — Telephone Encounter (Signed)
Virtual Visit Pre-Appointment Phone Call  "(Name), I am calling you today to discuss your upcoming appointment. We are currently trying to limit exposure to the virus that causes COVID-19 by seeing patients at home rather than in the office."  1. "What is the BEST phone number to call the day of the visit?" - include this in appointment notes  2. Do you have or have access to (through a family member/friend) a smartphone with video capability that we can use for your visit?" a. If yes - list this number in appt notes as cell (if different from BEST phone #) and list the appointment type as a VIDEO visit in appointment notes b. If no - list the appointment type as a PHONE visit in appointment notes  3. Confirm consent - "In the setting of the current Covid19 crisis, you are scheduled for a (phone or video) visit with your provider on (date) at (time).  Just as we do with many in-office visits, in order for you to participate in this visit, we must obtain consent.  If you'd like, I can send this to your mychart (if signed up) or email for you to review.  Otherwise, I can obtain your verbal consent now.  All virtual visits are billed to your insurance company just like a normal visit would be.  By agreeing to a virtual visit, we'd like you to understand that the technology does not allow for your provider to perform an examination, and thus may limit your provider's ability to fully assess your condition. If your provider identifies any concerns that need to be evaluated in person, we will make arrangements to do so.  Finally, though the technology is pretty good, we cannot assure that it will always work on either your or our end, and in the setting of a video visit, we may have to convert it to a phone-only visit.  In either situation, we cannot ensure that we have a secure connection.  Are you willing to proceed?" STAFF: Did the patient verbally acknowledge consent to telehealth visit? Document  YES/NO here: yes  4. Advise patient to be prepared - "Two hours prior to your appointment, go ahead and check your blood pressure, pulse, oxygen saturation, and your weight (if you have the equipment to check those) and write them all down. When your visit starts, your provider will ask you for this information. If you have an Apple Watch or Kardia device, please plan to have heart rate information ready on the day of your appointment. Please have a pen and paper handy nearby the day of the visit as well."  5. Give patient instructions for MyChart download to smartphone OR Doximity/Doxy.me as below if video visit (depending on what platform provider is using)  6. Inform patient they will receive a phone call 15 minutes prior to their appointment time (may be from unknown caller ID) so they should be prepared to answer    TELEPHONE CALL NOTE  Kathy Perry has been deemed a candidate for a follow-up tele-health visit to limit community exposure during the Covid-19 pandemic. I spoke with the patient via phone to ensure availability of phone/video source, confirm preferred email & phone number, and discuss instructions and expectations.  I reminded Kathy Perry to be prepared with any vital sign and/or heart rhythm information that could potentially be obtained via home monitoring, at the time of her visit. I reminded Kathy Perry to expect a phone call prior to  her visit.  Kathy Perry 05/28/2019 10:20 AM   INSTRUCTIONS FOR DOWNLOADING THE MYCHART APP TO SMARTPHONE  - The patient must first make sure to have activated MyChart and know their login information - If Apple, go to [employer] and type in MyChart in the search bar and download the app. If Android, ask patient to go to [employer] and type in [place] in the search bar and download the app. The app is free but as with any other app downloads, their phone may require them to verify saved payment information or Apple/Android password.    - The patient will need to then log into the app with their MyChart username and password, and select [facility] as their healthcare provider to link the account. When it is time for your visit, go to the MyChart app, find appointments, and click Begin Video Visit. Be sure to Select Allow for your device to access the Microphone and Camera for your visit. You will then be connected, and your provider will be with you shortly.  **If they have any issues connecting, or need assistance please contact MyChart service desk [phone])**  **If using a computer, in order to ensure the best quality for their visit they will need to use either of the following Internet Browsers: [employer], or Google Chrome**  IF USING DOXIMITY or DOXY.ME - The patient will receive a link just prior to their visit by text.     FULL LENGTH CONSENT FOR TELE-HEALTH VISIT   I hereby voluntarily request, consent and authorize [facility] and its employed or contracted physicians, physician assistants, nurse practitioners or other licensed health care professionals (the Practitioner), to provide me with telemedicine health care services (the Services") as deemed necessary by the treating Practitioner. I acknowledge and consent to receive the Services by the Practitioner via telemedicine. I understand that the telemedicine visit will involve communicating with the Practitioner through live audiovisual communication technology and the disclosure of certain medical information by electronic transmission. I acknowledge that I have been given the opportunity to request an in-person assessment or other available alternative prior to the telemedicine visit and am voluntarily participating in the telemedicine visit.  I understand that I have the right to withhold or withdraw my consent to the use of telemedicine in the course of my care at any time, without affecting my right to future care or treatment, and that  the Practitioner or I may terminate the telemedicine visit at any time. I understand that I have the right to inspect all information obtained and/or recorded in the course of the telemedicine visit and may receive copies of available information for a reasonable fee.  I understand that some of the potential risks of receiving the Services via telemedicine include:   Delay or interruption in medical evaluation due to technological equipment failure or disruption;  Information transmitted may not be sufficient (e.g. poor resolution of images) to allow for appropriate medical decision making by the Practitioner; and/or   In rare instances, security protocols could fail, causing a breach of personal health information.  Furthermore, I acknowledge that it is my responsibility to provide information about my medical history, conditions and care that is complete and accurate to the best of my ability. I acknowledge that Practitioner's advice, recommendations, and/or decision may be based on factors not within their control, such as incomplete or inaccurate data provided by me or distortions of diagnostic images or specimens that may result from electronic transmissions.  I understand that the practice of medicine is not an exact science and that Practitioner makes no warranties or guarantees regarding treatment outcomes. I acknowledge that I will receive a copy of this consent concurrently upon execution via email to the email address I last provided but may also request a printed copy by calling the office of [facility].    I understand that my insurance will be billed for this visit.   I have read or had this consent read to me.  I understand the contents of this consent, which adequately explains the benefits and risks of the Services being provided via telemedicine.   I have been provided ample opportunity to ask questions regarding this consent and the Services and have had my questions answered to  my satisfaction.  I give my informed consent for the services to be provided through the use of telemedicine in my medical care  By participating in this telemedicine visit I agree to the above.

## 2019-06-11 ENCOUNTER — Encounter: Payer: Self-pay | Admitting: Cardiovascular Disease

## 2019-06-11 ENCOUNTER — Telehealth (INDEPENDENT_AMBULATORY_CARE_PROVIDER_SITE_OTHER): Payer: Medicaid Other | Admitting: Cardiovascular Disease

## 2019-06-11 VITALS — Ht 65.5 in | Wt 180.0 lb

## 2019-06-11 DIAGNOSIS — R002 Palpitations: Secondary | ICD-10-CM

## 2019-06-11 DIAGNOSIS — I493 Ventricular premature depolarization: Secondary | ICD-10-CM

## 2019-06-11 DIAGNOSIS — I340 Nonrheumatic mitral (valve) insufficiency: Secondary | ICD-10-CM

## 2019-06-11 NOTE — Patient Instructions (Signed)
Medication Instructions:  Continue all current medications.  Labwork: none  Testing/Procedures: none  Follow-Up: Your physician wants you to follow up in:  1 year.  You will receive a reminder letter in the mail one-two months in advance.  If you don't receive a letter, please call our office to schedule the follow up appointment   Any Other Special Instructions Will Be Listed Below (If Applicable). List of primary care physicians included today as well.  If you need a refill on your cardiac medications before your next appointment, please call your pharmacy.

## 2019-06-11 NOTE — Progress Notes (Signed)
Virtual Visit via Telephone Note   This visit type was conducted due to national recommendations for restrictions regarding the COVID-19 Pandemic (e.g. social distancing) in an effort to limit this patient's exposure and mitigate transmission in our community.  Due to her co-morbid illnesses, this patient is at least at moderate risk for complications without adequate follow up.  This format is felt to be most appropriate for this patient at this time.  The patient did not have access to video technology/had technical difficulties with video requiring transitioning to audio format only (telephone).  All issues noted in this document were discussed and addressed.  No physical exam could be performed with this format.  Please refer to the patient's chart for her  consent to telehealth for [facility].   Date:  06/11/2019   ID:  Kathy Perry, DOB [birth year], MRN [id number]  Patient Location: Home Provider Location: Office  PCP:  Patient, No Pcp Per  Cardiologist:  Prentice Docker, MD  Electrophysiologist:  None   Evaluation Performed:  Follow-Up Visit  Chief Complaint: Palpitations  History of Present Illness:    Kathy Perry is a 58 y.o. female with a history of palpitations.  She complains of ringing in her years which is chronic.  She still doesn't have a PCP.  She denies palpitations.   She has a lot of stress at home.  She denies exertional chest pain. She also denies leg swelling.  She has a big dog which she walks routinely.   Past Medical History:  Diagnosis Date  . Arthritis   . Asthma   . Gestational diabetes   . IBS (irritable bowel syndrome)    Past Surgical History:  Procedure Laterality Date  . TUBAL LIGATION    . WISDOM TOOTH EXTRACTION       Current Meds  Medication Sig  . Aspirin-Acetaminophen-Caffeine (GOODYS EXTRA STRENGTH) 500-325-65 MG PACK Take 1 Package by mouth daily as needed (pain).     Allergies:   Codeine   Social History    Tobacco Use  . Smoking status: Never Smoker  . Smokeless tobacco: Never Used  Substance Use Topics  . Alcohol use: No  . Drug use: No     Family Hx: The patient's family history includes Breast cancer in her paternal aunt and paternal grandmother; Cancer in her father; Diabetes in her father; Lung cancer in her paternal aunt. There is no history of Colon cancer.  ROS:   Please see the history of present illness.     All other systems reviewed and are negative.   Prior CV studies:   The following studies were reviewed today:  Echocardiogram 01/18/2018 demonstrated normal left ventricular systolic function and regional wall motion, LVEF 60 to 65%, grade 1 diastolic dysfunction, and mild to moderate mitral regurgitation.  Event monitoring demonstrated sinus rhythm with isolated PACs and a short 4 beat run of wide-complex tachycardia.  Labs/Other Tests and Data Reviewed:    EKG:  No ECG reviewed.  Recent Labs: 12/13/2018: ALT 28; BUN 12; Creatinine, Ser 0.85; Hemoglobin 14.9; Platelets 149; Potassium 4.3; Sodium 143   Recent Lipid Panel No results found for: CHOL, TRIG, HDL, CHOLHDL, LDLCALC, LDLDIRECT  Wt Readings from Last 3 Encounters:  06/11/19 180 lb (81.6 kg)  02/21/19 184 lb 6.4 oz (83.6 kg)  12/13/18 182 lb (82.6 kg)     Objective:    Vital Signs:  Ht 5' 5.5" (1.664 m)   Wt 180 lb (81.6 kg)   LMP  (  LMP Unknown)   BMI 29.50 kg/m    VITAL SIGNS:  reviewed  ASSESSMENT & PLAN:    1.  Palpitations/symptomatic PVCs: Echocardiogram and event monitor reviewed above.   2.  Mitral regurgitation: Mild to moderate in severity by echocardiogram in July 2019 as reviewed above.  I will monitor.    COVID-19 Education: The signs and symptoms of COVID-19 were discussed with the patient and how to seek care for testing (follow up with PCP or arrange E-visit).  The importance of social distancing was discussed today.  Time:   Today, I have spent 10 minutes with the  patient with telehealth technology discussing the above problems.     Medication Adjustments/Labs and Tests Ordered: Current medicines are reviewed at length with the patient today.  Concerns regarding medicines are outlined above.   Tests Ordered: No orders of the defined types were placed in this encounter.   Medication Changes: No orders of the defined types were placed in this encounter.   Follow Up:  Either In Person or Virtual in 1 year(s)  Signed, Kate Sable, MD  06/11/2019 8:25 AM    [facility]

## 2019-11-13 ENCOUNTER — Ambulatory Visit (INDEPENDENT_AMBULATORY_CARE_PROVIDER_SITE_OTHER): Payer: Medicaid Other | Admitting: Family Medicine

## 2019-11-13 ENCOUNTER — Other Ambulatory Visit: Payer: Self-pay

## 2019-11-13 ENCOUNTER — Encounter: Payer: Self-pay | Admitting: Family Medicine

## 2019-11-13 VITALS — BP 128/86 | HR 82 | Temp 97.5°F | Resp 15 | Ht 65.5 in | Wt 170.0 lb

## 2019-11-13 DIAGNOSIS — R030 Elevated blood-pressure reading, without diagnosis of hypertension: Secondary | ICD-10-CM | POA: Diagnosis not present

## 2019-11-13 DIAGNOSIS — Z23 Encounter for immunization: Secondary | ICD-10-CM

## 2019-11-13 DIAGNOSIS — Z7689 Persons encountering health services in other specified circumstances: Secondary | ICD-10-CM | POA: Diagnosis not present

## 2019-11-13 DIAGNOSIS — R1310 Dysphagia, unspecified: Secondary | ICD-10-CM | POA: Diagnosis not present

## 2019-11-13 DIAGNOSIS — E663 Overweight: Secondary | ICD-10-CM | POA: Diagnosis not present

## 2019-11-13 DIAGNOSIS — R1319 Other dysphagia: Secondary | ICD-10-CM

## 2019-11-13 HISTORY — DX: Encounter for immunization: Z23

## 2019-11-13 HISTORY — DX: Overweight: E66.3

## 2019-11-13 NOTE — Patient Instructions (Addendum)
I appreciate the opportunity to provide you with care for your health and wellness. Today we discussed: establish care  Follow up: Sept for annual   Labs today  No referrals today  Injection will be sore for 24-72 hours. Tylenol, ice, or heating pad can be used.  Follow up with the GI dr to get dilation set up.  Please continue to practice social distancing to keep you, your family, and our community safe.  If you must go out, please wear a mask and practice good handwashing.  It was a pleasure to see you and I look forward to continuing to work together on your health and well-being. Please do not hesitate to call the office if you need care or have questions about your care.  Have a wonderful day and week. With Gratitude, Tereasa Coop, DNP, AGNP-BC

## 2019-11-13 NOTE — Assessment & Plan Note (Signed)
Injury/break in skin noted occurred outside,.  Unsure if she was exposed to metal but she does report working in her garden area frequently.  We will get updated tetanus today.

## 2019-11-13 NOTE — Assessment & Plan Note (Signed)
Improved, Kathy Perry is educated about the importance of exercise daily to help with weight management. A minumum of 30 minutes daily is recommended. Additionally, importance of healthy food choices  with portion control discussed.  Wt Readings from Last 3 Encounters:  11/13/19 170 lb (77.1 kg)  06/11/19 180 lb (81.6 kg)  02/21/19 184 lb 6.4 oz (83.6 kg)

## 2019-11-13 NOTE — Assessment & Plan Note (Signed)
Prehypertensive.  Will be getting some updated labs in addition to following her blood pressure at future appointments encouraged her to refrain from eating salt and to get 30 to 60 minutes of daily exercise.

## 2019-11-13 NOTE — Assessment & Plan Note (Signed)
I have encouraged her to reach back out to GI provider.  She again is dysphasic without typical GERD symptoms.  Declines going on PPI.  As previously reported needs EGD/dilatation in the future hopefully near future.  She reports that she is ready for this.  However she also reports that she is little anxious because she might get migraines from it or before it.  But she reports that she will follow up with them.

## 2019-11-13 NOTE — Progress Notes (Signed)
Subjective:  Patient ID: Kathy Perry, female    DOB: [birth year]  Age: 59 y.o. MRN: [id number]  CC:  Chief Complaint  Patient presents with  . Establish Care      HPI  HPI  Kathy Perry is a 59 year old female patient that is here to establish today.  Was previously being seen in medicine at a clinic but that clinics has closed down.  Overall she reports she does not have any issues or concerns the only issue she really wants to talk about today is this ongoing chest and neck area discomfort that has been going on for several months now.  She reports it does not last long but it just occurs when it occurs and she has not been able to associate it with anything.  In conversation it is identified that she might predominantly have this with swallowing and it looks like she might of had a soft food gets dysphagia identified in the past by GI and was supposed to have dilation but was unable to get that done.  She describes the discomfort as sharp sometimes and a dull ache.  She reports nothing aggravated that she can think of outside of identification of swallowing.  It is not relieved by anything.  It is off and on today she is not having any true discomfort with it.  She reports the discomfort is over the left sternal area and at times up underneath her neck jawline and to the left side of her esophagus area.  She declines wanting to go on a PPI at this time just that she had declined in the past.  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  Is not up-to-date on tetanus will be getting tetanus today.  She presents today with her husband Virl Diamond whom they have been married for 40 years in December. They have 3 children whom they are close with.  Overall she is doing well and has no other complaints.  Today patient denies signs and symptoms of COVID 19 infection including fever, chills, cough, shortness of breath, and  headache. Past Medical, Surgical, Social History, Allergies, and Medications have been Reviewed.   Past Medical History:  Diagnosis Date  . Arthritis   . Gestational diabetes   . IBS (irritable bowel syndrome)     Current Meds  Medication Sig  . Aspirin-Acetaminophen-Caffeine (GOODYS EXTRA STRENGTH) 500-325-65 MG PACK Take 1 Package by mouth daily as needed (pain).    ROS:  Review of Systems  Constitutional: Negative.   HENT: Negative.   Eyes: Negative.   Respiratory: Negative.   Cardiovascular: Negative.   Gastrointestinal: Negative.        See HPI  Genitourinary: Negative.   Musculoskeletal: Negative.   Skin: Negative.   Neurological: Negative.   Endo/Heme/Allergies: Negative.   Psychiatric/Behavioral: Negative.   All other systems reviewed and are negative.    Objective:   Today's Vitals: BP 128/86   Pulse 82   Temp (!) 97.5 F (36.4 C) (Temporal)   Resp 15   Ht 5' 5.5" (1.664 m)   Wt 170 lb (77.1 kg)   LMP  (LMP Unknown)   SpO2 98%   BMI 27.86 kg/m  Vitals with BMI 11/13/2019 06/11/2019 02/21/2019  Height 5' 5.5" 5' 5.5" 5' 5.5"  Weight 170 lbs 180 lbs 184 lbs 6 oz  BMI 27.85 29.49 30.21  Systolic 128 - 134  Diastolic 86 - 87  Pulse 82 - 89     Physical Exam Vitals and nursing note reviewed.  Constitutional:      Appearance: Normal appearance. She is well-developed, well-groomed and overweight.  HENT:     Head: Normocephalic and atraumatic.     Comments: Mask in place    Right Ear: External ear normal.     Left Ear: External ear normal.     Nose: Nose normal.     Mouth/Throat:     Mouth: Mucous membranes are moist.     Pharynx: Oropharynx is clear.  Eyes:     General:        Right eye: No discharge.        Left eye: No discharge.     Conjunctiva/sclera: Conjunctivae normal.  Cardiovascular:     Rate and Rhythm: Normal rate and regular rhythm.     Pulses: Normal pulses.     Heart sounds: Normal heart sounds.  Pulmonary:     Effort:  Pulmonary effort is normal.     Breath sounds: Normal breath sounds.  Musculoskeletal:        General: Normal range of motion.     Cervical back: Normal range of motion and neck supple.  Skin:    General: Skin is warm.  Neurological:     General: No focal deficit present.     Mental Status: She is alert and oriented to person, place, and time.  Psychiatric:        Attention and Perception: Attention normal.        Mood and Affect: Mood normal.        Speech: Speech normal.        Behavior: Behavior normal. Behavior is cooperative.        Thought Content: Thought content normal.        Cognition and Memory: Cognition normal.        Judgment: Judgment normal.      Assessment   1. Prehypertension   2. Overweight (BMI 25.0-29.9)   3. Need for Tdap vaccination   4. Esophageal dysphagia     Tests ordered Orders Placed This Encounter  Procedures  . Tdap vaccine greater than or equal to 7yo IM  . CBC  . COMPLETE METABOLIC PANEL WITH GFR  . Hemoglobin A1c  . TSH     Plan: Please see assessment and plan per problem list above.   No orders of the defined types were placed in this encounter.   Patient to follow-up in 04/01/2020 .  Perlie Mayo, NP

## 2019-11-14 ENCOUNTER — Other Ambulatory Visit (HOSPITAL_COMMUNITY): Payer: Self-pay | Admitting: Family Medicine

## 2019-11-14 DIAGNOSIS — Z1231 Encounter for screening mammogram for malignant neoplasm of breast: Secondary | ICD-10-CM

## 2019-11-14 LAB — COMPLETE METABOLIC PANEL WITH GFR
AG Ratio: 1.7 (calc) (ref 1.0–2.5)
ALT: 12 U/L (ref 6–29)
AST: 16 U/L (ref 10–35)
Albumin: 4.7 g/dL (ref 3.6–5.1)
Alkaline phosphatase (APISO): 91 U/L (ref 37–153)
BUN: 12 mg/dL (ref 7–25)
CO2: 30 mmol/L (ref 20–32)
Calcium: 9.6 mg/dL (ref 8.6–10.4)
Chloride: 104 mmol/L (ref 98–110)
Creat: 1.05 mg/dL (ref 0.50–1.05)
GFR, Est African American: 68 mL/min/{1.73_m2} (ref >=60)
GFR, Est Non African American: 58 mL/min/{1.73_m2} — ABNORMAL LOW (ref >=60)
Globulin: 2.7 g/dL (calc) (ref 1.9–3.7)
Glucose, Bld: 89 mg/dL (ref 65–139)
Potassium: 4.6 mmol/L (ref 3.5–5.3)
Sodium: 142 mmol/L (ref 135–146)
Total Bilirubin: 0.4 mg/dL (ref 0.2–1.2)
Total Protein: 7.4 g/dL (ref 6.1–8.1)

## 2019-11-14 LAB — CBC
HCT: 44.7 % (ref 35.0–45.0)
Hemoglobin: 14.9 g/dL (ref 11.7–15.5)
MCH: 28.8 pg (ref 27.0–33.0)
MCHC: 33.3 g/dL (ref 32.0–36.0)
MCV: 86.3 fL (ref 80.0–100.0)
MPV: 10.2 fL (ref 7.5–12.5)
Platelets: 172 10*3/uL (ref 140–400)
RBC: 5.18 10*6/uL — ABNORMAL HIGH (ref 3.80–5.10)
RDW: 13.1 % (ref 11.0–15.0)
WBC: 6.5 10*3/uL (ref 3.8–10.8)

## 2019-11-14 LAB — HEMOGLOBIN A1C
Hgb A1c MFr Bld: 5 % of total Hgb (ref <5.7)
Mean Plasma Glucose: 97 (calc)
eAG (mmol/L): 5.4 (calc)

## 2019-11-14 LAB — TSH: TSH: 0.69 mIU/L (ref 0.40–4.50)

## 2019-11-23 ENCOUNTER — Other Ambulatory Visit: Payer: Self-pay

## 2019-11-23 ENCOUNTER — Ambulatory Visit (HOSPITAL_COMMUNITY)
Admission: RE | Admit: 2019-11-23 | Discharge: 2019-11-23 | Disposition: A | Discharge status: Home / Self Care | Payer: Medicaid Other | Source: Ambulatory Visit | Attending: Family Medicine | Admitting: Family Medicine

## 2019-11-23 DIAGNOSIS — Z1231 Encounter for screening mammogram for malignant neoplasm of breast: Secondary | ICD-10-CM | POA: Insufficient documentation

## 2019-11-23 DIAGNOSIS — Z7689 Persons encountering health services in other specified circumstances: Secondary | ICD-10-CM | POA: Diagnosis not present

## 2019-11-26 ENCOUNTER — Telehealth: Payer: Self-pay | Admitting: *Deleted

## 2019-11-26 ENCOUNTER — Other Ambulatory Visit (HOSPITAL_COMMUNITY): Payer: Self-pay | Admitting: Family Medicine

## 2019-11-26 ENCOUNTER — Telehealth (INDEPENDENT_AMBULATORY_CARE_PROVIDER_SITE_OTHER): Payer: Medicaid Other | Admitting: Gastroenterology

## 2019-11-26 ENCOUNTER — Other Ambulatory Visit: Payer: Self-pay

## 2019-11-26 ENCOUNTER — Encounter: Payer: Self-pay | Admitting: Gastroenterology

## 2019-11-26 DIAGNOSIS — R928 Other abnormal and inconclusive findings on diagnostic imaging of breast: Secondary | ICD-10-CM

## 2019-11-26 DIAGNOSIS — R1319 Other dysphagia: Secondary | ICD-10-CM

## 2019-11-26 DIAGNOSIS — R1310 Dysphagia, unspecified: Secondary | ICD-10-CM | POA: Diagnosis not present

## 2019-11-26 MED ORDER — PANTOPRAZOLE SODIUM 40 MG PO TBEC: 40.0000 mg | DELAYED_RELEASE_TABLET | Freq: Every day | ORAL | 3 refills | Status: DC

## 2019-11-26 NOTE — Telephone Encounter (Signed)
Sofie Rower, you are scheduled for a virtual visit with your provider today.  Just as we do with appointments in the office, we must obtain your consent to participate.  Your consent will be active for this visit and any virtual visit you may have with one of our providers in the next 365 days.  If you have a MyChart account, I can also send a copy of this consent to you electronically.  All virtual visits are billed to your insurance company just like a traditional visit in the office.  As this is a virtual visit, video technology does not allow for your provider to perform a traditional examination.  This may limit your provider's ability to fully assess your condition.  If your provider identifies any concerns that need to be evaluated in person or the need to arrange testing such as labs, EKG, etc, we will make arrangements to do so.  Although advances in technology are sophisticated, we cannot ensure that it will always work on either your end or our end.  If the connection with a video visit is poor, we may have to switch to a telephone visit.  With either a video or telephone visit, we are not always able to ensure that we have a secure connection.   I need to obtain your verbal consent now.   Are you willing to proceed with your visit today?

## 2019-11-26 NOTE — Patient Instructions (Signed)
1. Start pantoprazole 40mg  daily before breakfast. Prescription sent to [facility].  2. We will see you back in the office in six weeks to see how you are doing but if symptoms worsen please call me sooner.

## 2019-11-26 NOTE — Telephone Encounter (Signed)
Pt consented to a virtual visit today.   

## 2019-11-26 NOTE — Progress Notes (Signed)
Primary Care Physician:  Freddy Finner, NP Primary GI:  Formerly Jonette Eva, MD   Patient Location: Home  Provider Location: [facility] office  Reason for Visit:  Chief Complaint  Patient presents with  . Dysphagia    improving, Issues comes/goes, Will have some pain on left side feels like it is squeezing     Persons present on the virtual encounter, with roles: Patient, myself (provider),Mindy Estudillo CMA (updated meds and allergies)  Total time (minutes) spent on medical discussion: 20 minutes  Due to COVID-19, visit was conducted using Mychart video method.  Visit was requested by patient.  Virtual Visit via Mychart video  I connected with RIVKAH WOLZ on 11/26/19 at 11:00 AM EDT by Mychart video and verified that I am speaking with the correct person using two identifiers.   I discussed the limitations, risks, security and privacy concerns of performing an evaluation and management service by telephone/video and the availability of in person appointments. I also discussed with the patient that there may be a patient responsible charge related to this service. The patient expressed understanding and agreed to proceed.   HPI:   Kathy Perry is a 59 y.o. female who presents for virtual visit regarding dysphagia. Patient was last seen in 02/2019. Offered EGD/ED at the time but patient cancelled due to family emergency. She completed Cologuard last year which was negative after declining colonoscopy.  No solid food dysphagia for awhile. Used to have to lay back to get stuff going down. She states she is "a chewer" and makes sure she chews her food very thoroughly. Recently swallowing a peanut butter sandwich caused pain on the left side of throat and will hurt down into the chest. no heartburn. Similar issues off/on with different foods. Does not happen all the time. No issues with liquids. Has wondered if maybe it was her heart. No exertional component and she has been evaluated  by cardiology. No stomach pain. BM regular. No melena, brbpr.   She takes Goodys powders twice per day.     Current Outpatient Medications  Medication Sig Dispense Refill  . Aspirin-Acetaminophen-Caffeine (GOODYS EXTRA STRENGTH) 500-325-65 MG PACK Take 1 Package by mouth daily as needed (pain).     No current facility-administered medications for this visit.    Past Medical History:  Diagnosis Date  . Arthritis   . Gestational diabetes   . IBS (irritable bowel syndrome)     Past Surgical History:  Procedure Laterality Date  . TUBAL LIGATION    . WISDOM TOOTH EXTRACTION      Family History  Problem Relation Age of Onset  . Diabetes Father   . Cancer Father        not sure primary  . Memory loss Mother   . Cancer Brother        thyroid  . COPD Brother   . Breast cancer Paternal Grandmother   . Breast cancer Paternal Aunt   . Lung cancer Paternal Aunt   . Colon cancer Neg Hx     Social History   Socioeconomic History  . Marital status: Married    Spouse name: Virl Diamond  . Number of children: 3  . Years of education: Not on file  . Highest education level: 9th grade  Occupational History  . Occupation: homemaker  Tobacco Use  . Smoking status: Never Smoker  . Smokeless tobacco: Never Used  Substance and Sexual Activity  . Alcohol use: No  . Drug  use: Never  . Sexual activity: Not Currently    Birth control/protection: Surgical, Post-menopausal    Comment: tubal  Other Topics Concern  . Not on file  Social History Narrative   Lives with [facility] 40 years   1 son, 2 daughters   4 grandchildren       Cat: Lucy   Dog: Roofus       Enjoys: walking with dog, flowers, sewing, iron       Diet: eats all food groups-limited meat    Caffeine: some coke    Water: 2-4 cups of water      Wears seat belt   Smoke detectors at home    Social Determinants of Health   Financial Resource Strain: Low Risk   . Difficulty of Paying Living Expenses: Not hard at  all  Food Insecurity: No Food Insecurity  . Worried About Charity fundraiser in the Last Year: Never true  . Ran Out of Food in the Last Year: Never true  Transportation Needs: No Transportation Needs  . Lack of Transportation (Medical): No  . Lack of Transportation (Non-Medical): No  Physical Activity: Inactive  . Days of Exercise per Week: 0 days  . Minutes of Exercise per Session: 0 min  Stress: No Stress Concern Present  . Feeling of Stress : Not at all  Social Connections: Somewhat Isolated  . Frequency of Communication with Friends and Family: More than three times a week  . Frequency of Social Gatherings with Friends and Family: More than three times a week  . Attends Religious Services: Never  . Active Member of Clubs or Organizations: No  . Attends Archivist Meetings: Never  . Marital Status: Married  Human resources officer Violence: Not At Risk  . Fear of Current or Ex-Partner: No  . Emotionally Abused: No  . Physically Abused: No  . Sexually Abused: No      ROS:  General: Negative for anorexia, weight loss, fever, chills, fatigue, weakness. Eyes: Negative for vision changes.  ENT: Negative for hoarseness, nasal congestion. CV: Negative for chest pain, angina, palpitations, dyspnea on exertion, peripheral edema.  Respiratory: Negative for dyspnea at rest, dyspnea on exertion, cough, sputum, wheezing.  GI: See history of present illness. GU:  Negative for dysuria, hematuria, urinary incontinence, urinary frequency, nocturnal urination.  MS: Negative for joint pain, low back pain.  Derm: Negative for rash or itching.  Neuro: Negative for weakness, abnormal sensation, seizure, frequent headaches, memory loss, confusion.  Psych: Negative for anxiety, depression, suicidal ideation, hallucinations.  Endo: Negative for unusual weight change.  Heme: Negative for bruising or bleeding. Allergy: Negative for rash or hives.   Observations/Objective: Pleasant  cooperative female in nad.  Lab Results  Component Value Date   TSH 0.69 11/13/2019   Lab Results  Component Value Date   HGBA1C 5.0 11/13/2019   Lab Results  Component Value Date   CREATININE 1.05 11/13/2019   BUN 12 11/13/2019   NA 142 11/13/2019   K 4.6 11/13/2019   CL 104 11/13/2019   CO2 30 11/13/2019   Lab Results  Component Value Date   ALT 12 11/13/2019   AST 16 11/13/2019   ALKPHOS 98 12/13/2018   BILITOT 0.4 11/13/2019   Lab Results  Component Value Date   WBC 6.5 11/13/2019   HGB 14.9 11/13/2019   HCT 44.7 11/13/2019   MCV 86.3 11/13/2019   PLT 172 11/13/2019     Assessment and Plan:  Follow Up Instructions:    I discussed the assessment and treatment plan with the patient. The patient was provided an opportunity to ask questions and all were answered. The patient agreed with the plan and demonstrated an understanding of the instructions. AVS mailed to patient's home address.   The patient was advised to call back or seek an in-person evaluation if the symptoms worsen or if the condition fails to improve as anticipated.  I provided 20 minutes of virtual face-to-face time during this encounter.   Tana Coast, PA-C

## 2019-11-27 NOTE — Assessment & Plan Note (Signed)
Some solid food dysphagia/odynophagia. Never completed EGD/ED previously advised. Denies typical heartburn. Would advise PPI daily for several weeks to see if any improvement in symptoms. At this time she wants to hold off on EGD due to fear of migraines related to anesthesia. Will have her come for follow up in six weeks to reassess and decide if EGD indicated at that time.   For colon cancer screening, she had negative Cologuard 02/2019. Would advise next colon cancer screening in 02/2022 preferably by colonoscopy if agreeable, otherwise by repeat Cologuard.

## 2019-11-28 ENCOUNTER — Telehealth: Payer: Self-pay | Admitting: Gastroenterology

## 2019-11-28 NOTE — Telephone Encounter (Signed)
Please NIC for Cologuard vs colonoscopy in 02/2022.

## 2019-11-30 NOTE — Telephone Encounter (Signed)
It is on recall

## 2019-12-04 ENCOUNTER — Ambulatory Visit (HOSPITAL_COMMUNITY)
Admission: RE | Admit: 2019-12-04 | Discharge: 2019-12-04 | Disposition: A | Discharge status: Home / Self Care | Payer: Medicaid Other | Source: Ambulatory Visit | Attending: Family Medicine | Admitting: Family Medicine

## 2019-12-04 ENCOUNTER — Other Ambulatory Visit: Payer: Self-pay

## 2019-12-04 ENCOUNTER — Ambulatory Visit: Payer: Medicaid Other | Admitting: Family Medicine

## 2019-12-04 DIAGNOSIS — N6321 Unspecified lump in the left breast, upper outer quadrant: Secondary | ICD-10-CM | POA: Diagnosis not present

## 2019-12-04 DIAGNOSIS — N6312 Unspecified lump in the right breast, upper inner quadrant: Secondary | ICD-10-CM | POA: Diagnosis not present

## 2019-12-04 DIAGNOSIS — Z7689 Persons encountering health services in other specified circumstances: Secondary | ICD-10-CM | POA: Diagnosis not present

## 2019-12-04 DIAGNOSIS — R921 Mammographic calcification found on diagnostic imaging of breast: Secondary | ICD-10-CM | POA: Diagnosis not present

## 2019-12-04 DIAGNOSIS — R928 Other abnormal and inconclusive findings on diagnostic imaging of breast: Secondary | ICD-10-CM

## 2019-12-04 DIAGNOSIS — N6323 Unspecified lump in the left breast, lower outer quadrant: Secondary | ICD-10-CM | POA: Diagnosis not present

## 2019-12-05 ENCOUNTER — Other Ambulatory Visit: Payer: Self-pay | Admitting: *Deleted

## 2019-12-05 DIAGNOSIS — R928 Other abnormal and inconclusive findings on diagnostic imaging of breast: Secondary | ICD-10-CM

## 2019-12-19 ENCOUNTER — Other Ambulatory Visit: Payer: Self-pay | Admitting: Family Medicine

## 2019-12-19 ENCOUNTER — Ambulatory Visit
Admission: RE | Admit: 2019-12-19 | Discharge: 2019-12-19 | Disposition: A | Discharge status: Home / Self Care | Payer: Medicaid Other | Source: Ambulatory Visit | Attending: Family Medicine | Admitting: Family Medicine

## 2019-12-19 ENCOUNTER — Other Ambulatory Visit: Payer: Self-pay

## 2019-12-19 DIAGNOSIS — R928 Other abnormal and inconclusive findings on diagnostic imaging of breast: Secondary | ICD-10-CM

## 2019-12-19 DIAGNOSIS — R590 Localized enlarged lymph nodes: Secondary | ICD-10-CM | POA: Diagnosis not present

## 2019-12-19 DIAGNOSIS — R921 Mammographic calcification found on diagnostic imaging of breast: Secondary | ICD-10-CM

## 2019-12-24 ENCOUNTER — Other Ambulatory Visit: Payer: Self-pay

## 2019-12-24 ENCOUNTER — Ambulatory Visit
Admission: RE | Admit: 2019-12-24 | Discharge: 2019-12-24 | Disposition: A | Discharge status: Home / Self Care | Payer: Medicaid Other | Source: Ambulatory Visit | Attending: Family Medicine | Admitting: Family Medicine

## 2019-12-24 ENCOUNTER — Other Ambulatory Visit: Payer: Self-pay | Admitting: Family Medicine

## 2019-12-24 DIAGNOSIS — R921 Mammographic calcification found on diagnostic imaging of breast: Secondary | ICD-10-CM

## 2019-12-24 DIAGNOSIS — N6092 Unspecified benign mammary dysplasia of left breast: Secondary | ICD-10-CM | POA: Diagnosis not present

## 2019-12-25 ENCOUNTER — Other Ambulatory Visit: Payer: Self-pay | Admitting: Family Medicine

## 2019-12-25 DIAGNOSIS — N63 Unspecified lump in unspecified breast: Secondary | ICD-10-CM

## 2020-01-10 ENCOUNTER — Other Ambulatory Visit: Payer: Self-pay

## 2020-01-10 ENCOUNTER — Ambulatory Visit
Admission: RE | Admit: 2020-01-10 | Discharge: 2020-01-10 | Disposition: A | Discharge status: Home / Self Care | Payer: Medicaid Other | Source: Ambulatory Visit | Attending: Family Medicine | Admitting: Family Medicine

## 2020-01-10 DIAGNOSIS — N63 Unspecified lump in unspecified breast: Secondary | ICD-10-CM

## 2020-01-10 DIAGNOSIS — N62 Hypertrophy of breast: Secondary | ICD-10-CM | POA: Diagnosis not present

## 2020-01-10 DIAGNOSIS — N6342 Unspecified lump in left breast, subareolar: Secondary | ICD-10-CM | POA: Diagnosis not present

## 2020-01-15 ENCOUNTER — Ambulatory Visit: Payer: Self-pay | Admitting: Surgery

## 2020-01-15 DIAGNOSIS — N6092 Unspecified benign mammary dysplasia of left breast: Secondary | ICD-10-CM | POA: Diagnosis not present

## 2020-01-15 NOTE — H&P (Signed)
CLINICAL DATA:  59 year old female status post stereotactic biopsy of left breast calcifications.  EXAM: DIAGNOSTIC LEFT MAMMOGRAM POST STEREOTACTIC BIOPSY  COMPARISON:  Previous exam(s).  FINDINGS: Mammographic images were obtained following stereotactic guided biopsy of left breast calcifications. The biopsy marking clip is in expected position at the site of biopsy.  IMPRESSION: Appropriate positioning of the coil shaped biopsy marking clip at the site of biopsy in the superior central left breast.  Final Assessment: Post Procedure Mammograms for Marker Placement   Electronically Signed   By: Sande Brothers M.D.   On: 12/24/2019 13:35

## 2020-01-22 ENCOUNTER — Encounter: Payer: Self-pay | Admitting: Gastroenterology

## 2020-01-22 ENCOUNTER — Other Ambulatory Visit: Payer: Self-pay | Admitting: Surgery

## 2020-01-22 ENCOUNTER — Ambulatory Visit (INDEPENDENT_AMBULATORY_CARE_PROVIDER_SITE_OTHER): Payer: Medicaid Other | Admitting: Gastroenterology

## 2020-01-22 ENCOUNTER — Other Ambulatory Visit: Payer: Self-pay

## 2020-01-22 ENCOUNTER — Encounter: Payer: Self-pay | Admitting: *Deleted

## 2020-01-22 VITALS — BP 135/85 | HR 88 | Temp 97.1°F | Ht 65.0 in | Wt 168.4 lb

## 2020-01-22 DIAGNOSIS — R1310 Dysphagia, unspecified: Secondary | ICD-10-CM

## 2020-01-22 DIAGNOSIS — K582 Mixed irritable bowel syndrome: Secondary | ICD-10-CM | POA: Diagnosis not present

## 2020-01-22 DIAGNOSIS — R1319 Other dysphagia: Secondary | ICD-10-CM

## 2020-01-22 DIAGNOSIS — K219 Gastro-esophageal reflux disease without esophagitis: Secondary | ICD-10-CM | POA: Insufficient documentation

## 2020-01-22 DIAGNOSIS — N6092 Unspecified benign mammary dysplasia of left breast: Secondary | ICD-10-CM

## 2020-01-22 NOTE — Patient Instructions (Addendum)
1. Xray of your esophagus to evaluate your swallowing.  2. Continue pantoprazole daily before a meal.  3. Add Fiberchoice, chew two daily to help regular your bowels.  4. Monitor your weight. If you lose down below 165 pounds, please let me know.  5. Return to the office in four months for follow up.

## 2020-01-22 NOTE — Progress Notes (Signed)
Primary Care Physician: Perlie Mayo, NP  Primary Gastroenterologist:  Formerly Barney Drain, MD  Chief Complaint  Patient presents with  . Dysphagia    f/u. has episodes of choking at times, or feels sore    HPI: Kathy Perry is a 59 y.o. female here for follow-up of dysphagia.  Last visit May 2021, virtual visit.  She was offered EGD with dilation in 2020 but patient canceled due to family emergency.  She completed a Cologuard after declining colonoscopy last year, it was negative.  Plans for repeat in 2023.  Takes Goody powders twice daily.  She continued to have some intermittent solid food dysphagia/odynophagia at time of last visit.  We advised daily PPI for several weeks to see if symptoms improved as she wanted to continue to hold off on EGD due to fear of migraines related to anesthesia.  Heartburn a lot better on pantoprazole.  Prior to starting pantoprazole, she did not appreciate having heartburn.  She has noticed significant difference on medication however.  Continues to have intermittent dysphagia to solid foods but not daily.  Can go several days without any problems.  Does not feel like pantoprazole has helped this part.  Sometimes she will have pain in the left throat area and the dysphagia occurs. No vomiting. A lot of belching lately. Weight down 12 pounds. Cut out salt to work on blood pressure.  Patient states she has been having several test/biopsies due to abnormal mammogram.  She states there has been no cancer but they have recommended lumpectomy.  She has avoided eating prior to multiple appointments because of her IBS and fear for having diarrhea.  She tends to go days without a bowel movement but then will have fecal urgency with loose stools.  She contributes her weight loss to her stress and IBS, food avoidance.  No melena or rectal bleeding.Lactose intolerance.   Wt Readings from Last 3 Encounters:  01/22/20 168 lb 6.4 oz (76.4 kg)  11/13/19 170 lb  (77.1 kg)  06/11/19 180 lb (81.6 kg)     Current Outpatient Medications  Medication Sig Dispense Refill  . Aspirin-Acetaminophen-Caffeine (GOODYS EXTRA STRENGTH) 500-325-65 MG PACK Take 1 Package by mouth daily as needed (pain).    . pantoprazole (PROTONIX) 40 MG tablet Take 1 tablet (40 mg total) by mouth daily before breakfast. 30 tablet 3   No current facility-administered medications for this visit.    Allergies as of 01/22/2020 - Review Complete 01/22/2020  Allergen Reaction Noted  . Codeine Other (See Comments) 04/05/2015    ROS:  General: Negative for anorexia, weight loss, fever, chills, fatigue, weakness. ENT: Negative for hoarseness, nasal congestion.  See HPI CV: Negative for chest pain, angina, palpitations, dyspnea on exertion, peripheral edema.  Respiratory: Negative for dyspnea at rest, dyspnea on exertion, cough, sputum, wheezing.  GI: See history of present illness. GU:  Negative for dysuria, hematuria, urinary incontinence, urinary frequency, nocturnal urination.  Endo: Negative for unusual weight change.  See HPI   Physical Examination:   BP 135/85   Pulse 88   Temp (!) 97.1 F (36.2 C) (Oral)   Ht 5' 5"  (1.651 m)   Wt 168 lb 6.4 oz (76.4 kg)   LMP  (LMP Unknown)   BMI 28.02 kg/m   General: Well-nourished, well-developed in no acute distress.  Eyes: No icterus. Mouth: masked Lungs: Clear to auscultation bilaterally.  Heart: Regular rate and rhythm, no murmurs rubs or gallops.  Abdomen: Bowel  sounds are normal, nontender, nondistended, no hepatosplenomegaly or masses, no abdominal bruits or hernia , no rebound or guarding.   Extremities: No lower extremity edema. No clubbing or deformities. Neuro: Alert and oriented x 4   Skin: Warm and dry, no jaundice.   Psych: Alert and cooperative, normal mood and affect.  Labs:  Lab Results  Component Value Date   CREATININE 1.05 11/13/2019   BUN 12 11/13/2019   NA 142 11/13/2019   K 4.6 11/13/2019   CL  104 11/13/2019   CO2 30 11/13/2019   Lab Results  Component Value Date   ALT 12 11/13/2019   AST 16 11/13/2019   ALKPHOS 98 12/13/2018   BILITOT 0.4 11/13/2019   Lab Results  Component Value Date   WBC 6.5 11/13/2019   HGB 14.9 11/13/2019   HCT 44.7 11/13/2019   MCV 86.3 11/13/2019   PLT 172 11/13/2019   Lab Results  Component Value Date   TSH 0.69 11/13/2019    Imaging Studies: MM CLIP PLACEMENT LEFT  Result Date: 01/10/2020 CLINICAL DATA:  59 year old female status post ultrasound-guided biopsy of the left breast. EXAM: DIAGNOSTIC LEFT MAMMOGRAM POST ULTRASOUND BIOPSY COMPARISON:  Previous exam(s). FINDINGS: Mammographic images were obtained following ultrasound guided biopsy of left breast mass. The biopsy marking clip is in expected position at the site of biopsy. IMPRESSION: Appropriate positioning of the ribbon shaped biopsy marking clip at the site of biopsy in the subareolar left breast. Final Assessment: Post Procedure Mammograms for Marker Placement Electronically Signed   By: Kristopher Oppenheim M.D.   On: 01/10/2020 13:49   MM CLIP PLACEMENT LEFT  Result Date: 12/24/2019 CLINICAL DATA:  59 year old female status post stereotactic biopsy of left breast calcifications. EXAM: DIAGNOSTIC LEFT MAMMOGRAM POST STEREOTACTIC BIOPSY COMPARISON:  Previous exam(s). FINDINGS: Mammographic images were obtained following stereotactic guided biopsy of left breast calcifications. The biopsy marking clip is in expected position at the site of biopsy. IMPRESSION: Appropriate positioning of the coil shaped biopsy marking clip at the site of biopsy in the superior central left breast. Final Assessment: Post Procedure Mammograms for Marker Placement Electronically Signed   By: Kristopher Oppenheim M.D.   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 This was found to be concordant by Dr. Kristopher Oppenheim. Pathology results were discussed with the patient's husband Kathy Perry) by telephone. The patient reported doing well after the biopsy with tenderness at the site. Post biopsy instructions and care were reviewed and questions were answered. The patient was encouraged to call [facility] for any additional concerns. The patient is scheduled for ultrasound guided biopsies of the retroareolar left breast at 3:00 and 11:00 on January 10, 2020 per diagnostic report from 11/17/19. Surgical consultation has been arranged with Dr. Donnie Mesa at [facility] on January 15, 2020. Pathology results reported by Stacie Acres RN on 12/25/2019. Electronically Signed   By: Kristopher Oppenheim M.D.   On: 12/25/2019 15:18   Result Date: 12/25/2019 CLINICAL DATA:  59 year old female with indeterminate left breast calcifications. EXAM: LEFT BREAST STEREOTACTIC CORE NEEDLE BIOPSY COMPARISON:  Previous exams. FINDINGS: The patient and I discussed the procedure of stereotactic-guided biopsy including benefits and alternatives. We discussed the high likelihood of a successful procedure. We discussed the risks  of the procedure including infection, bleeding, tissue injury, clip migration, and inadequate sampling. Informed written consent was given. The usual time out protocol was performed immediately prior to the procedure. Using sterile technique and 1% Lidocaine as local anesthetic, under stereotactic guidance, a 9 gauge vacuum assisted device was used to perform core needle biopsy of calcifications in the upper left breast using a lateral approach. Specimen radiograph was performed showing calcifications in multiple specimens. Specimens with calcifications are identified for pathology. Lesion quadrant: Upper outer quadrant At the conclusion of the procedure, coil shaped  tissue marker clip was deployed into the biopsy cavity. Follow-up 2-view mammogram was performed and dictated separately. IMPRESSION: Stereotactic-guided biopsy of the left breast. No apparent complications. Electronically Signed: By: Kristopher Oppenheim M.D. On: 12/24/2019 13:35   [place] LT BREAST BX W LOC DEV 1ST LESION IMG BX SPEC US GUIDE  Addendum Date: 01/14/2020   ADDENDUM REPORT: 01/14/2020 08:35 ADDENDUM: Pathology revealed USUAL DUCTAL HYPERPLASIA of the LEFT breast, 3 o'clock, retroareolar (ribbon clip). This was found to be concordant by Dr. Kristopher Oppenheim. Pathology results were discussed with the patient's husband, Kathy Perry by telephone, per request. Mr. Moncrief reported his wife did well after the biopsy with tenderness at the site. Post biopsy instructions and care were reviewed and questions were answered. Mr. Barletta was encouraged to call [facility] for any additional concerns. My direct phone number was provided for the patient's husband. Surgical consultation has been arranged with Dr. 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 Pathology results reported by Terie Purser, RN on 01/14/2020. Electronically Signed   By: Kristopher Oppenheim M.D.   On: 01/14/2020 08:35   Result Date: 01/14/2020 CLINICAL DATA:  59 year old female with recently diagnosed left breast atypia presents for ultrasound-guided biopsy of 2 additional left breast masses. EXAM: ULTRASOUND GUIDED LEFT BREAST CORE NEEDLE BIOPSY COMPARISON:  Previous exam(s). PROCEDURE: I met with the patient and we discussed the procedure of ultrasound-guided biopsy, including benefits and alternatives. We discussed the high likelihood of a successful procedure. We discussed the risks of the procedure, including infection, bleeding, tissue injury, clip migration, and inadequate sampling. Informed written consent was given.  The usual time-out protocol was performed immediately prior to the procedure. At the time of preprocedural scanning. The mass identified at the 11 o'clock position 3 cm from the nipple has decreased significantly in size. It currently measures 3 x 3 x 2 mm, compared to 6 x 5 x 3 mm at the time of diagnostic evaluation. This is consistent with a benign process. Therefore, the biopsy in this location was canceled. The biopsy of the retroareolar mass at the 3 o'clock position was performed. Lesion quadrant: Upper outer quadrant Using sterile technique and 1% Lidocaine as local anesthetic, under direct ultrasound visualization, a 12 gauge spring-loaded device was used to perform biopsy of a mass at the 3 o'clock retroareolar region using a inferior approach. At the conclusion of the procedure ribbon shaped tissue marker clip was deployed into the biopsy cavity. Follow up 2 view mammogram was performed and dictated separately. IMPRESSION: Ultrasound guided biopsy of a left breast mass at the 3 o'clock retroareolar position only. No apparent complications. The mass at the 11 o'clock position 3 cm from the nipple demonstrated significant decrease in size, consistent with a benign process. Electronically Signed: By: Kristopher Oppenheim M.D. On: 01/10/2020 13:47   Impression/Plan:  Pleasant 59 year old female presenting for follow-up of solid food dysphagia/odynophagia.  She has been on pantoprazole for 6 weeks without any notable improvement in the symptoms.  She continues to have sporadic symptoms, not occurring daily.  However she does appreciate improvement of reflux symptoms which she was not aware that she had.  Previously did not feel like she had heartburn but now on medication she notes significant improvement.  She has been apprehensive to undergoing EGD related to anesthesia.  She is interested in pursuing barium pill esophagram at this time.  IBS with alternating constipation and diarrhea, flare recently related  to stress surrounding abnormal mammogram.  Can go days at a time without a bowel movement has fecal urgency with diarrhea.  We will add Fiberchoice chew 2 tablets daily.  She has had some decline in weight as outlined above, will have her monitor and call with any additional significant weight loss, i.e. if she goes under 165 pounds.  Return to the office in 4 months.  Call sooner if needed.

## 2020-01-25 ENCOUNTER — Other Ambulatory Visit: Payer: Self-pay

## 2020-01-25 ENCOUNTER — Ambulatory Visit (HOSPITAL_COMMUNITY)
Admission: RE | Admit: 2020-01-25 | Discharge: 2020-01-25 | Disposition: A | Discharge status: Home / Self Care | Payer: Medicaid Other | Source: Ambulatory Visit | Attending: Gastroenterology | Admitting: Gastroenterology

## 2020-01-25 DIAGNOSIS — R1319 Other dysphagia: Secondary | ICD-10-CM

## 2020-01-25 DIAGNOSIS — R1310 Dysphagia, unspecified: Secondary | ICD-10-CM | POA: Diagnosis not present

## 2020-02-21 ENCOUNTER — Other Ambulatory Visit: Payer: Self-pay

## 2020-02-21 ENCOUNTER — Encounter (HOSPITAL_BASED_OUTPATIENT_CLINIC_OR_DEPARTMENT_OTHER): Payer: Self-pay | Admitting: Surgery

## 2020-02-25 ENCOUNTER — Other Ambulatory Visit (HOSPITAL_COMMUNITY)
Admission: RE | Admit: 2020-02-25 | Discharge: 2020-02-25 | Disposition: A | Discharge status: Home / Self Care | Payer: Medicaid Other | Source: Ambulatory Visit | Attending: Surgery | Admitting: Surgery

## 2020-02-25 DIAGNOSIS — Z20822 Contact with and (suspected) exposure to covid-19: Secondary | ICD-10-CM | POA: Diagnosis not present

## 2020-02-25 DIAGNOSIS — Z01812 Encounter for preprocedural laboratory examination: Secondary | ICD-10-CM | POA: Insufficient documentation

## 2020-02-25 LAB — SARS CORONAVIRUS 2 (TAT 6-24 HRS): SARS Coronavirus 2: NEGATIVE

## 2020-02-27 ENCOUNTER — Other Ambulatory Visit: Payer: Self-pay

## 2020-02-27 ENCOUNTER — Ambulatory Visit
Admission: RE | Admit: 2020-02-27 | Discharge: 2020-02-27 | Disposition: A | Discharge status: Home / Self Care | Payer: Medicaid Other | Source: Ambulatory Visit | Attending: Surgery | Admitting: Surgery

## 2020-02-27 DIAGNOSIS — N6092 Unspecified benign mammary dysplasia of left breast: Secondary | ICD-10-CM | POA: Diagnosis not present

## 2020-02-27 MED ORDER — CHLORHEXIDINE GLUCONATE CLOTH 2 % EX PADS: 6.0000 | MEDICATED_PAD | Freq: Once | CUTANEOUS | Status: DC

## 2020-02-27 NOTE — Progress Notes (Signed)

## 2020-02-28 ENCOUNTER — Ambulatory Visit (HOSPITAL_BASED_OUTPATIENT_CLINIC_OR_DEPARTMENT_OTHER): Payer: Medicaid Other | Admitting: Anesthesiology

## 2020-02-28 ENCOUNTER — Ambulatory Visit
Admission: RE | Admit: 2020-02-28 | Discharge: 2020-02-28 | Disposition: A | Discharge status: Home / Self Care | Payer: Medicaid Other | Source: Ambulatory Visit | Attending: Surgery | Admitting: Surgery

## 2020-02-28 ENCOUNTER — Other Ambulatory Visit: Payer: Self-pay

## 2020-02-28 ENCOUNTER — Encounter (HOSPITAL_BASED_OUTPATIENT_CLINIC_OR_DEPARTMENT_OTHER): Payer: Self-pay | Admitting: Surgery

## 2020-02-28 ENCOUNTER — Ambulatory Visit (HOSPITAL_BASED_OUTPATIENT_CLINIC_OR_DEPARTMENT_OTHER)
Admission: RE | Admit: 2020-02-28 | Discharge: 2020-02-28 | Disposition: A | Discharge status: Home / Self Care | Payer: Medicaid Other | Attending: Surgery | Admitting: Surgery

## 2020-02-28 ENCOUNTER — Encounter (HOSPITAL_BASED_OUTPATIENT_CLINIC_OR_DEPARTMENT_OTHER)
Admission: RE | Disposition: A | Discharge status: Home / Self Care | Payer: Self-pay | Source: Home / Self Care | Attending: Surgery

## 2020-02-28 DIAGNOSIS — Z79899 Other long term (current) drug therapy: Secondary | ICD-10-CM | POA: Insufficient documentation

## 2020-02-28 DIAGNOSIS — Z803 Family history of malignant neoplasm of breast: Secondary | ICD-10-CM | POA: Insufficient documentation

## 2020-02-28 DIAGNOSIS — N6012 Diffuse cystic mastopathy of left breast: Secondary | ICD-10-CM | POA: Diagnosis not present

## 2020-02-28 DIAGNOSIS — Z885 Allergy status to narcotic agent status: Secondary | ICD-10-CM | POA: Diagnosis not present

## 2020-02-28 DIAGNOSIS — M1990 Unspecified osteoarthritis, unspecified site: Secondary | ICD-10-CM | POA: Insufficient documentation

## 2020-02-28 DIAGNOSIS — N6092 Unspecified benign mammary dysplasia of left breast: Secondary | ICD-10-CM | POA: Insufficient documentation

## 2020-02-28 DIAGNOSIS — R928 Other abnormal and inconclusive findings on diagnostic imaging of breast: Secondary | ICD-10-CM | POA: Diagnosis not present

## 2020-02-28 DIAGNOSIS — K219 Gastro-esophageal reflux disease without esophagitis: Secondary | ICD-10-CM | POA: Insufficient documentation

## 2020-02-28 DIAGNOSIS — F419 Anxiety disorder, unspecified: Secondary | ICD-10-CM | POA: Diagnosis not present

## 2020-02-28 HISTORY — DX: Anxiety disorder, unspecified: F41.9

## 2020-02-28 HISTORY — PX: BREAST LUMPECTOMY WITH RADIOACTIVE SEED LOCALIZATION: SHX6424

## 2020-02-28 SURGERY — BREAST LUMPECTOMY WITH RADIOACTIVE SEED LOCALIZATION
Anesthesia: General | Site: Breast | Laterality: Left

## 2020-02-28 MED ORDER — FENTANYL CITRATE (PF) 100 MCG/2ML IJ SOLN
INTRAMUSCULAR | Status: AC
  Filled 2020-02-28: qty 2

## 2020-02-28 MED ORDER — PROPOFOL 10 MG/ML IV BOLUS
INTRAVENOUS | Status: AC
  Filled 2020-02-28: qty 20

## 2020-02-28 MED ORDER — 0.9 % SODIUM CHLORIDE (POUR BTL) OPTIME
TOPICAL | Status: DC | PRN
  Administered 2020-02-28: 200 mL

## 2020-02-28 MED ORDER — CEFAZOLIN SODIUM-DEXTROSE 2-4 GM/100ML-% IV SOLN
2.0000 g | INTRAVENOUS | Status: AC
  Administered 2020-02-28: 2 g via INTRAVENOUS

## 2020-02-28 MED ORDER — ONDANSETRON HCL 4 MG/2ML IJ SOLN
INTRAMUSCULAR | Status: DC | PRN
  Administered 2020-02-28: 4 mg via INTRAVENOUS

## 2020-02-28 MED ORDER — DEXAMETHASONE SODIUM PHOSPHATE 10 MG/ML IJ SOLN
INTRAMUSCULAR | Status: DC | PRN
  Administered 2020-02-28: 10 mg via INTRAVENOUS

## 2020-02-28 MED ORDER — GABAPENTIN 300 MG PO CAPS
300.0000 mg | ORAL_CAPSULE | ORAL | Status: AC
  Administered 2020-02-28: 300 mg via ORAL

## 2020-02-28 MED ORDER — DEXAMETHASONE SODIUM PHOSPHATE 10 MG/ML IJ SOLN
INTRAMUSCULAR | Status: AC
  Filled 2020-02-28: qty 1

## 2020-02-28 MED ORDER — OXYCODONE HCL 5 MG PO TABS: 5.0000 mg | ORAL_TABLET | Freq: Once | ORAL | Status: DC | PRN

## 2020-02-28 MED ORDER — ACETAMINOPHEN 500 MG PO TABS
ORAL_TABLET | ORAL | Status: AC
  Filled 2020-02-28: qty 2

## 2020-02-28 MED ORDER — ONDANSETRON HCL 4 MG/2ML IJ SOLN
INTRAMUSCULAR | Status: AC
  Filled 2020-02-28: qty 2

## 2020-02-28 MED ORDER — LIDOCAINE 2% (20 MG/ML) 5 ML SYRINGE
INTRAMUSCULAR | Status: AC
  Filled 2020-02-28: qty 5

## 2020-02-28 MED ORDER — OXYCODONE HCL 5 MG/5ML PO SOLN: 5.0000 mg | Freq: Once | ORAL | Status: DC | PRN

## 2020-02-28 MED ORDER — PROPOFOL 10 MG/ML IV BOLUS
INTRAVENOUS | Status: DC | PRN
  Administered 2020-02-28: 150 mg via INTRAVENOUS
  Administered 2020-02-28: 30 mg via INTRAVENOUS
  Administered 2020-02-28: 20 mg via INTRAVENOUS

## 2020-02-28 MED ORDER — MIDAZOLAM HCL 5 MG/5ML IJ SOLN
INTRAMUSCULAR | Status: DC | PRN
  Administered 2020-02-28: 2 mg via INTRAVENOUS

## 2020-02-28 MED ORDER — BUPIVACAINE HCL (PF) 0.25 % IJ SOLN
INTRAMUSCULAR | Status: DC | PRN
  Administered 2020-02-28: 10 mL

## 2020-02-28 MED ORDER — MEPERIDINE HCL 25 MG/ML IJ SOLN: 6.2500 mg | INTRAMUSCULAR | Status: DC | PRN

## 2020-02-28 MED ORDER — AMISULPRIDE (ANTIEMETIC) 5 MG/2ML IV SOLN: 10.0000 mg | Freq: Once | INTRAVENOUS | Status: DC | PRN

## 2020-02-28 MED ORDER — LACTATED RINGERS IV SOLN: INTRAVENOUS | Status: DC

## 2020-02-28 MED ORDER — ACETAMINOPHEN 500 MG PO TABS
1000.0000 mg | ORAL_TABLET | ORAL | Status: AC
  Administered 2020-02-28: 1000 mg via ORAL

## 2020-02-28 MED ORDER — CEFAZOLIN SODIUM-DEXTROSE 2-4 GM/100ML-% IV SOLN
INTRAVENOUS | Status: AC
  Filled 2020-02-28: qty 100

## 2020-02-28 MED ORDER — GABAPENTIN 300 MG PO CAPS
ORAL_CAPSULE | ORAL | Status: AC
  Filled 2020-02-28: qty 1

## 2020-02-28 MED ORDER — MIDAZOLAM HCL 2 MG/2ML IJ SOLN
INTRAMUSCULAR | Status: AC
  Filled 2020-02-28: qty 2

## 2020-02-28 MED ORDER — PROMETHAZINE HCL 25 MG/ML IJ SOLN: 6.2500 mg | INTRAMUSCULAR | Status: DC | PRN

## 2020-02-28 MED ORDER — FENTANYL CITRATE (PF) 100 MCG/2ML IJ SOLN
INTRAMUSCULAR | Status: DC | PRN
  Administered 2020-02-28 (×2): 50 ug via INTRAVENOUS

## 2020-02-28 MED ORDER — EPHEDRINE SULFATE 50 MG/ML IJ SOLN
INTRAMUSCULAR | Status: DC | PRN
Start: 2020-02-28 — End: 2020-02-28
  Administered 2020-02-28: 10 mg via INTRAVENOUS

## 2020-02-28 MED ORDER — EPHEDRINE 5 MG/ML INJ
INTRAVENOUS | Status: AC
  Filled 2020-02-28: qty 10

## 2020-02-28 MED ORDER — HYDROMORPHONE HCL 1 MG/ML IJ SOLN: 0.2500 mg | INTRAMUSCULAR | Status: DC | PRN

## 2020-02-28 SURGICAL SUPPLY — 54 items
APL PRP STRL LF DISP 70% ISPRP (MISCELLANEOUS) ×1
APL SKNCLS STERI-STRIP NONHPOA (GAUZE/BANDAGES/DRESSINGS) ×1
APPLIER CLIP 9.375 MED OPEN (MISCELLANEOUS)
APR CLP MED 9.3 20 MLT OPN (MISCELLANEOUS)
BENZOIN TINCTURE PRP APPL 2/3 (GAUZE/BANDAGES/DRESSINGS) ×3 IMPLANT
BLADE HEX COATED 2.75 (ELECTRODE) ×1 IMPLANT
BLADE SURG 15 STRL LF DISP TIS (BLADE) ×1 IMPLANT
BLADE SURG 15 STRL SS (BLADE) ×3
CANISTER SUC SOCK COL 7IN (MISCELLANEOUS) IMPLANT
CANISTER SUCT 1200ML W/VALVE (MISCELLANEOUS) ×2 IMPLANT
CHLORAPREP W/TINT 26 (MISCELLANEOUS) ×3 IMPLANT
CLIP APPLIE 9.375 MED OPEN (MISCELLANEOUS) ×1 IMPLANT
CLOSURE WOUND 1/2 X4 (GAUZE/BANDAGES/DRESSINGS) ×1
COVER BACK TABLE 60X90IN (DRAPES) ×3 IMPLANT
COVER MAYO STAND STRL (DRAPES) ×3 IMPLANT
COVER PROBE W GEL 5X96 (DRAPES) ×3 IMPLANT
COVER WAND RF STERILE (DRAPES) IMPLANT
DECANTER SPIKE VIAL GLASS SM (MISCELLANEOUS) IMPLANT
DRAPE LAPAROTOMY 100X72 PEDS (DRAPES) ×3 IMPLANT
DRAPE UTILITY XL STRL (DRAPES) ×3 IMPLANT
DRSG TEGADERM 4X4.75 (GAUZE/BANDAGES/DRESSINGS) ×3 IMPLANT
ELECT REM PT RETURN 9FT ADLT (ELECTROSURGICAL) ×3
ELECTRODE REM PT RTRN 9FT ADLT (ELECTROSURGICAL) ×1 IMPLANT
GAUZE SPONGE 4X4 12PLY STRL LF (GAUZE/BANDAGES/DRESSINGS) IMPLANT
GLOVE BIO SURGEON STRL SZ7 (GLOVE) ×3 IMPLANT
GLOVE BIOGEL PI IND STRL 7.5 (GLOVE) ×1 IMPLANT
GLOVE BIOGEL PI INDICATOR 7.5 (GLOVE) ×2
GOWN STRL REUS W/ TWL LRG LVL3 (GOWN DISPOSABLE) ×2 IMPLANT
GOWN STRL REUS W/TWL LRG LVL3 (GOWN DISPOSABLE) ×6
ILLUMINATOR WAVEGUIDE N/F (MISCELLANEOUS) IMPLANT
KIT MARKER MARGIN INK (KITS) ×3 IMPLANT
LIGHT WAVEGUIDE WIDE FLAT (MISCELLANEOUS) IMPLANT
NDL HYPO 25X1 1.5 SAFETY (NEEDLE) ×1 IMPLANT
NEEDLE HYPO 25X1 1.5 SAFETY (NEEDLE) ×3 IMPLANT
NS IRRIG 1000ML POUR BTL (IV SOLUTION) ×3 IMPLANT
PACK BASIN DAY SURGERY FS (CUSTOM PROCEDURE TRAY) ×3 IMPLANT
PENCIL SMOKE EVACUATOR (MISCELLANEOUS) ×3 IMPLANT
SLEEVE SCD COMPRESS KNEE MED (MISCELLANEOUS) ×3 IMPLANT
SPONGE GAUZE 2X2 8PLY STER LF (GAUZE/BANDAGES/DRESSINGS)
SPONGE GAUZE 2X2 8PLY STRL LF (GAUZE/BANDAGES/DRESSINGS) IMPLANT
SPONGE LAP 18X18 RF (DISPOSABLE) IMPLANT
SPONGE LAP 4X18 RFD (DISPOSABLE) ×3 IMPLANT
STRIP CLOSURE SKIN 1/2X4 (GAUZE/BANDAGES/DRESSINGS) ×2 IMPLANT
SUT MON AB 4-0 PC3 18 (SUTURE) ×3 IMPLANT
SUT SILK 2 0 SH (SUTURE) IMPLANT
SUT VIC AB 3-0 SH 27 (SUTURE) ×3
SUT VIC AB 3-0 SH 27X BRD (SUTURE) ×1 IMPLANT
SYR BULB EAR ULCER 3OZ GRN STR (SYRINGE) IMPLANT
SYR CONTROL 10ML LL (SYRINGE) ×3 IMPLANT
TOWEL GREEN STERILE FF (TOWEL DISPOSABLE) ×3 IMPLANT
TRAY FAXITRON CT DISP (TRAY / TRAY PROCEDURE) ×3 IMPLANT
TUBE CONNECTING 20'X1/4 (TUBING) ×1
TUBE CONNECTING 20X1/4 (TUBING) ×1 IMPLANT
YANKAUER SUCT BULB TIP NO VENT (SUCTIONS) IMPLANT

## 2020-02-28 NOTE — Anesthesia Preprocedure Evaluation (Addendum)
Anesthesia Evaluation  Patient identified by MRN, date of birth, ID band Patient awake    Reviewed: Allergy & Precautions, NPO status , Patient's Chart, lab work & pertinent test results  Airway Mallampati: II  TM Distance: >3 FB Neck ROM: Full    Dental no notable dental hx.    Pulmonary neg pulmonary ROS,    Pulmonary exam normal breath sounds clear to auscultation       Cardiovascular negative cardio ROS Normal cardiovascular exam Rhythm:Regular Rate:Normal     Neuro/Psych Anxiety negative neurological ROS  negative psych ROS   GI/Hepatic Neg liver ROS, GERD  Medicated,  Endo/Other  negative endocrine ROS  Renal/GU negative Renal ROS  negative genitourinary   Musculoskeletal  (+) Arthritis , Osteoarthritis,    Abdominal   Peds negative pediatric ROS (+)  Hematology negative hematology ROS (+)   Anesthesia Other Findings   Reproductive/Obstetrics negative OB ROS                             Anesthesia Physical Anesthesia Plan  ASA: II  Anesthesia Plan: General   Post-op Pain Management:    Induction: Intravenous  PONV Risk Score and Plan: 3 and Ondansetron, Dexamethasone, Midazolam and Treatment may vary due to age or medical condition  Airway Management Planned: LMA  Additional Equipment:   Intra-op Plan:   Post-operative Plan: Extubation in OR  Informed Consent: I have reviewed the patients History and Physical, chart, labs and discussed the procedure including the risks, benefits and alternatives for the proposed anesthesia with the patient or authorized representative who has indicated his/her understanding and acceptance.     Dental advisory given  Plan Discussed with: CRNA  Anesthesia Plan Comments:         Anesthesia Quick Evaluation

## 2020-02-28 NOTE — Transfer of Care (Signed)
Immediate Anesthesia Transfer of Care Note  [name]  Procedure(s) Performed: LEFT BREAST LUMPECTOMY WITH RADIOACTIVE SEED LOCALIZATION (Left Breast)  Patient Location: PACU  Anesthesia Type:General  Level of Consciousness: drowsy, patient cooperative and responds to stimulation  Airway & Oxygen Therapy: Patient Spontanous Breathing and Patient connected to face mask oxygen  Post-op Assessment: Report given to RN and Post -op Vital signs reviewed and stable  Post vital signs: Reviewed and stable  Last Vitals:  Vitals Value Taken Time  BP    Temp    Pulse    Resp    SpO2      Last Pain:  Vitals:   02/28/20 0821  PainSc: 0-No pain      Patients Stated Pain Goal: 3 (02/28/20 0821)  Complications: No complications documented.

## 2020-02-28 NOTE — Anesthesia Postprocedure Evaluation (Signed)
Anesthesia Post Note  [name]  Procedure(s) Performed: LEFT BREAST LUMPECTOMY WITH RADIOACTIVE SEED LOCALIZATION (Left Breast)     Patient location during evaluation: PACU Anesthesia Type: General Level of consciousness: awake and alert Pain management: pain level controlled Vital Signs Assessment: post-procedure vital signs reviewed and stable Respiratory status: spontaneous breathing, nonlabored ventilation and respiratory function stable Cardiovascular status: blood pressure returned to baseline and stable Postop Assessment: no apparent nausea or vomiting Anesthetic complications: no   No complications documented.  Last Vitals:  Vitals:   02/28/20 1030 02/28/20 1054  BP: 124/83 128/83  Pulse: 89 87  Resp: 12 16  Temp:  36.9 C  SpO2: 97% 100%    Last Pain:  Vitals:   02/28/20 1054  TempSrc: Oral  PainSc: 0-No pain                 Lowella Curb

## 2020-02-28 NOTE — Op Note (Signed)
Pre-op Diagnosis:  Left atypical ductal hyperplasia  Post-op Diagnosis: same Procedure:  Left radioactive seed localized lumpectomy Surgeon:  Yaretsi Humphres K. Anesthesia:  GEN - LMA Indications:  This is a 59 year old female who presents after routine recent bilateral screening mammogram. This showed abnormalities in both breasts. Further evaluation of the left breast showed only some cysts that appeared to be benign. No further evaluation was performed of the left side. On the right side, she had an axillary lymph node that was borderline thickened. Subsequent biopsy of this showed only a reactive lymph node. She had an area in the left upper inner quadrant that showed a 4 mm area of calcifications. She also had a 3 mm area in the retroareolar region. 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 The left upper inner quadrant area was biopsied and revealed atypical ductal hyperplasia with calcifications. She presents now for excision of the area of atypical ductal hyperplasia.  Description of procedure: The patient is brought to the operating room placed in supine position on the operating room table. After an adequate level of general anesthesia was obtained, her left breast was prepped with ChloraPrep and draped in sterile fashion. A timeout was taken to ensure the proper patient and proper procedure. We interrogated the breast with the neoprobe. We made a circumareolar incision around the upper side of the nipple after infiltrating with 0.25% Marcaine. Dissection was carried down in the breast tissue with cautery. We used the neoprobe to guide [place] towards the radioactive seed. We excised an area of tissue around the radioactive seed 1.5 cm in diameter. The specimen was removed and was oriented with a paint kit. Specimen mammogram showed the radioactive seed as well as the biopsy clip within the specimen. This was sent for pathologic examination. There  is no residual radioactivity within the biopsy cavity. We inspected carefully for hemostasis. The wound was thoroughly irrigated. The wound was closed with a deep layer of 3-0 Vicryl and a subcuticular layer of 4-0 Monocryl. Benzoin Steri-Strips were applied. The patient was then extubated and brought to the recovery room in stable condition. All sponge, instrument, and needle counts are correct.  Imogene Burn. Georgette Dover, MD, [facility]  General/ Trauma Surgery  02/28/2020 9:57 AM

## 2020-02-28 NOTE — Anesthesia Procedure Notes (Signed)
Procedure Name: LMA Insertion Date/Time: 02/28/2020 9:19 AM Performed by: Thornell Mule, CRNA Pre-anesthesia Checklist: Patient identified, Emergency Drugs available, Suction available and Patient being monitored Patient Re-evaluated:Patient Re-evaluated prior to induction Oxygen Delivery Method: Circle system utilized Preoxygenation: Pre-oxygenation with 100% oxygen Induction Type: IV induction LMA: LMA inserted LMA Size: 4.0 Number of attempts: 1 Placement Confirmation: positive ETCO2 Tube secured with: Tape Dental Injury: Teeth and Oropharynx as per pre-operative assessment

## 2020-02-28 NOTE — H&P (Signed)
History of Present Illness The patient is a 59 year old female who presents with a breast mass. PCP - Tereasa Coop, NP Referred for right breast ADH  This is a 59 year old female who presents after routine recent bilateral screening mammogram. This showed abnormalities in both breasts. Further evaluation of the left breast showed only some cysts that appeared to be benign. No further evaluation was performed of the left side. On the right side, she had an axillary lymph node that was borderline thickened. Subsequent biopsy of this showed only a reactive lymph node. She had an area in the left upper inner quadrant that showed a 4 mm area of calcifications. She also had a 3 mm area in the retroareolar region. 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 The left upper inner quadrant area was biopsied and revealed atypical ductal hyperplasia with calcifications. She presents now to discuss excision of the area of atypical ductal hyperplasia.  She has a family history of breast cancer in a paternal grandmother and a paternal aunt.  CLINICAL DATA: 59 year old female presenting as a recall from screening for bilateral abnormalities.  EXAM: DIGITAL DIAGNOSTIC BILATERAL MAMMOGRAM WITH CAD AND TOMO  ULTRASOUND BILATERAL BREAST  COMPARISON: Previous exam(s).  ACR Breast Density Category b: There are scattered areas of fibroglandular density.  FINDINGS: Mammogram:  Right breast: Spot compression and full field tomosynthesis views of the right breast were performed. On the additional imaging there is persistence of a round mass in the upper inner right breast measuring approximately 0.5 cm. There are no new findings in the right breast.  Left breast: Spot compression and full field tomosynthesis views of the left breast performed. Spot magnification 2D views were also performed. There are grouped punctate calcifications in the  upper slightly inner left breast measuring 4 mm. There appears to be an associated asymmetry. In the retroareolar slightly outer left breast there is an oval obscured mass measuring approximately 0.8 cm.  Mammographic images were processed with CAD.  Ultrasound:  Right breast:  Targeted ultrasound is performed in the right breast at 2 o'clock 6 cm from the nipple demonstrating a round circumscribed anechoic mass measuring 0.5 x 0.4 x 0.5 cm, consistent with a benign simple cyst. No internal blood flow identified. This corresponds to the mammographic finding. Incidentally noted is just superior to this is another tiny cyst.  Left breast:  Targeted ultrasound performed in the left breast at 3 o'clock retroareolar demonstrates an oval circumscribed hypoechoic mass measuring 0.8 x 0.6 x 0.9 cm, most likely a complicated cyst. No internal blood flow. Incidentally noted at 10 o'clock retroareolar is a benign cluster of cysts measuring 1.3 x 0.6 x 0.9 cm. Ultrasound performed in the left breast at 11 o'clock 3 cm from the nipple demonstrates an oval circumscribed hypoechoic mass measuring 0.5 x 0.6 x 0.3 cm with small internal echogenic foci, possibly representing calcifications.  Targeted ultrasound of the left axilla demonstrates a single abnormal lymph node with cortical thickening measuring 0.4 cm.  IMPRESSION: 1. Right breast mass at 2 o'clock measuring 0.5 cm consistent with a benign simple cyst.  2. Left breast calcifications spanning 4 mm in the upper inner quadrant are indeterminate.  3. Probably benign left breast masses at retroareolar/3 o'clock and 11 o'clock, likely complicated cysts. The mass at 11 o'clock may possibly correspond to the calcifications/asymmetry seen mammographically.  4. Single abnormal lymph node with mildly thickened cortex.  RECOMMENDATION: 1. Stereotactic core needle biopsy of the  left breast calcifications.  2. Ultrasound-guided core  needle biopsy of the left axillary lymph node.  3. Six-month ultrasound of the probably benign left breast masses at retroareolar/3 o'clock and 11 o'clock, given that the above recommended biopsies returned benign. If the biopsies return as atypia or malignancy, biopsy would be recommended of these probably benign findings.  BI-RADS CATEGORY 4: Suspicious.   Electronically Signed By: Emmaline Kluver M.D. On: 12/04/2019 16:30  CLINICAL DATA: 59 year old female with indeterminate left breast calcifications.  EXAM: LEFT BREAST STEREOTACTIC CORE NEEDLE BIOPSY  COMPARISON: Previous exams.  FINDINGS: The patient and I discussed the procedure of stereotactic-guided biopsy including benefits and alternatives. We discussed the high likelihood of a successful procedure. We discussed the risks of the procedure including infection, bleeding, tissue injury, clip migration, and inadequate sampling. Informed written consent was given. The usual time out protocol was performed immediately prior to the procedure.  Using sterile technique and 1% Lidocaine as local anesthetic, under stereotactic guidance, a 9 gauge vacuum assisted device was used to perform core needle biopsy of calcifications in the upper left breast using a lateral approach. Specimen radiograph was performed showing calcifications in multiple specimens. Specimens with calcifications are identified for pathology.  Lesion quadrant: Upper outer quadrant  At the conclusion of the procedure, coil shaped tissue marker clip was deployed into the biopsy cavity. Follow-up 2-view mammogram was performed and dictated separately.  IMPRESSION: Stereotactic-guided biopsy of the left breast. No apparent complications.  Electronically Signed: By: Sande Brothers M.D. On: 12/24/2019 13:35   CLINICAL DATA: 59 year old female status post stereotactic biopsy of left breast calcifications.  EXAM: DIAGNOSTIC LEFT MAMMOGRAM  POST STEREOTACTIC BIOPSY  COMPARISON: Previous exam(s).  FINDINGS: Mammographic images were obtained following stereotactic guided biopsy of left breast calcifications. The biopsy marking clip is in expected position at the site of biopsy.  IMPRESSION: Appropriate positioning of the coil shaped biopsy marking clip at the site of biopsy in the superior central left breast.  Final Assessment: Post Procedure Mammograms for Marker Placement   Electronically Signed By: Sande Brothers M.D. On: 12/24/2019 13:35    Past Surgical History  Oral Surgery   Diagnostic Studies History Colonoscopy  never Mammogram  within last year Pap Smear  1-5 years ago  Allergies  Codeine and Related   Medication History  Pantoprazole Sodium (40MG  Tablet DR, Oral) Active.  Social History  Caffeine use  Carbonated beverages. No alcohol use  No drug use  Tobacco use  Never smoker.  Family History  Alcohol Abuse  Brother, Daughter, Family Members In [place]. Arthritis  Father. Cancer  Father, Mother. Colon Polyps  Daughter. Depression  Brother. Diabetes Mellitus  Father. Migraine Headache  Son. Respiratory Condition  Brother.  Pregnancy / Birth History  Age at menarche  13 years. Age of menopause  88-50 Gravida  3 Maternal age  44-20 Para  3  Other Problems Arthritis  Kidney Stone  Migraine Headache     Review of Systems  General Not Present- Appetite Loss, Chills, Fatigue, Fever, Night Sweats, Weight Gain and Weight Loss. Skin Not Present- Change in Wart/Mole, Dryness, Hives, Jaundice, New Lesions, Non-Healing Wounds, Rash and Ulcer. HEENT Present- Seasonal Allergies. Not Present- Earache, Hearing Loss, Hoarseness, Nose Bleed, Oral Ulcers, Ringing in the Ears, Sinus Pain, Sore Throat, Visual Disturbances, Wears glasses/contact lenses and Yellow Eyes. Cardiovascular Not Present- Chest Pain, Difficulty Breathing Lying Down, Leg Cramps, Palpitations,  Rapid Heart Rate, Shortness of Breath and Swelling of Extremities. Gastrointestinal Present- Difficulty Swallowing. Not Present- Abdominal  Pain, Bloating, Bloody Stool, Change in Bowel Habits, Chronic diarrhea, Constipation, Excessive gas, Gets full quickly at meals, Hemorrhoids, Indigestion, Nausea, Rectal Pain and Vomiting. Female Genitourinary Not Present- Frequency, Nocturia, Painful Urination, Pelvic Pain and Urgency. Musculoskeletal Present- Joint Stiffness. Not Present- Back Pain, Joint Pain, Muscle Pain, Muscle Weakness and Swelling of Extremities. Neurological Present- Headaches. Not Present- Decreased Memory, Fainting, Numbness, Seizures, Tingling, Tremor, Trouble walking and Weakness. Psychiatric Not Present- Anxiety, Bipolar, Change in Sleep Pattern, Depression, Fearful and Frequent crying. Endocrine Not Present- Cold Intolerance, Excessive Hunger, Hair Changes, Heat Intolerance, Hot flashes and New Diabetes. Hematology Not Present- Blood Thinners, Easy Bruising, Excessive bleeding, Gland problems, HIV and Persistent Infections.  Vitals  Weight: 167.13 lb Height: 65in Body Surface Area: 1.83 m Body Mass Index: 27.81 kg/m  Temp.: 98.80F  Pulse: 110 (Regular)  BP: 120/80(Sitting, Left Arm, Standard)       Physical Exam  The physical exam findings are as follows: Note: Constitutional: WDWN in NAD, conversant, no obvious deformities; resting comfortably Eyes: Pupils equal, round; sclera anicteric; moist conjunctiva; no lid lag HENT: Oral mucosa moist; good dentition Neck: No masses palpated, trachea midline; no thyromegaly Lungs: CTA bilaterally; normal respiratory effort Breasts: symmetric; no nipple changes or discharge; bilateral fibrocystic breasts; no dominant masses; no palpable axillary lymphadenopathy; ecchymosis in lower left breast CV: Regular rate and rhythm; no murmurs; extremities well-perfused with no edema Abd: +bowel sounds, soft, non-tender, no  palpable organomegaly; no palpable hernias Musc: Normal gait; no apparent clubbing or cyanosis in extremities Lymphatic: No palpable cervical or axillary lymphadenopathy Skin: Warm, dry; no sign of jaundice Psychiatric - alert and oriented x 4; calm mood and affect    Assessment & Plan  ATYPICAL DUCTAL HYPERPLASIA OF LEFT BREAST (N60.92) Impression: LUIQ - 4 mm Current Plans Schedule for Surgery - Left radioactive seed localized lumpectomy. The surgical procedure has been discussed with the patient. Potential risks, benefits, alternative treatments, and expected outcomes have been explained. All of the patient's questions at this time have been answered. The likelihood of reaching the patient's treatment goal is good. The patient understand the proposed surgical procedure and wishes to proceed.   Wilmon Arms. Corliss Skains, MD, [facility]  General/ Trauma Surgery   02/28/2020 8:11 AM

## 2020-02-28 NOTE — Discharge Instructions (Signed)
[facility] Phone Number [phone]  BREAST BIOPSY/ PARTIAL MASTECTOMY: POST OP INSTRUCTIONS  Always review your discharge instruction sheet given to you by the facility where your surgery was performed.  IF YOU HAVE DISABILITY OR FAMILY LEAVE FORMS, YOU MUST BRING THEM TO THE OFFICE FOR PROCESSING.  DO NOT GIVE THEM TO YOUR DOCTOR.  1. A prescription for pain medication may be given to you upon discharge.  Take your pain medication as prescribed, if needed.  If narcotic pain medicine is not needed, then you may take acetaminophen (Tylenol) or ibuprofen (Advil) as needed. 2. Take your usually prescribed medications unless otherwise directed 3. If you need a refill on your pain medication, please contact your pharmacy.  They will contact our office to request authorization.  Prescriptions will not be filled after 5pm or on week-ends. 4. You should eat very light the first 24 hours after surgery, such as soup, crackers, pudding, etc.  Resume your normal diet the day after surgery. 5. Most patients will experience some swelling and bruising in the breast.  Ice packs and a good support bra will help.  Swelling and bruising can take several days to resolve.  6. It is common to experience some constipation if taking pain medication after surgery.  Increasing fluid intake and taking a stool softener will usually help or prevent this problem from occurring.  A mild laxative (Milk of Magnesia or Miralax) should be taken according to package directions if there are no bowel movements after 48 hours. 7. Unless discharge instructions indicate otherwise, you may remove your bandages 24-48 hours after surgery, and you may shower at that time.  You may have steri-strips (small skin tapes) in place directly over the incision.  These strips should be left on the skin for 7-10 days.  If your surgeon used skin glue on the incision, you may shower in 24 hours.  The glue will flake off over the  next 2-3 weeks.  Any sutures or staples will be removed at the office during your follow-up visit. 8. ACTIVITIES:  You may resume regular daily activities (gradually increasing) beginning the next day.  Wearing a good support bra or sports bra minimizes pain and swelling.  You may have sexual intercourse when it is comfortable. a. You may drive when you no longer are taking prescription pain medication, you can comfortably wear a seatbelt, and you can safely maneuver your car and apply brakes. b. RETURN TO WORK:  ______________________________________________________________________________________ 9. You should see your doctor in the office for a follow-up appointment approximately two weeks after your surgery.  Your doctor's nurse will typically make your follow-up appointment when she calls you with your pathology report.  Expect your pathology report 2-3 business days after your surgery.  You may call to check if you do not hear from [place] after three days. 10. OTHER INSTRUCTIONS: _______________________________________________________________________________________________ _____________________________________________________________________________________________________________________________________ _____________________________________________________________________________________________________________________________________ _____________________________________________________________________________________________________________________________________  WHEN TO CALL YOUR DOCTOR: 1. Fever over 101.0 2. Nausea and/or vomiting. 3. Extreme swelling or bruising. 4. Continued bleeding from incision. 5. Increased pain, redness, or drainage from the incision.  The clinic staff is available to answer your questions during regular business hours.  Please don't hesitate to call and ask to speak to one of the nurses for clinical concerns.  If you have a medical emergency, go to the nearest  emergency room or call 911.  A surgeon from [facility] is always on call at the hospital.  For further questions, please visit [email or web address]  No Tylenol before 2:30pm if needed   Post Anesthesia Home Care Instructions  Activity: Get plenty of rest for the remainder of the day. A responsible individual must stay with you for 24 hours following the procedure.  For the next 24 hours, DO NOT: -Drive a car -Advertising copywriter -Drink alcoholic beverages -Take any medication unless instructed by your physician -Make any legal decisions or sign important papers.  Meals: Start with liquid foods such as gelatin or soup. Progress to regular foods as tolerated. Avoid greasy, spicy, heavy foods. If nausea and/or vomiting occur, drink only clear liquids until the nausea and/or vomiting subsides. Call your physician if vomiting continues.  Special Instructions/Symptoms: Your throat may feel dry or sore from the anesthesia or the breathing tube placed in your throat during surgery. If this causes discomfort, gargle with warm salt water. The discomfort should disappear within 24 hours.  If you had a scopolamine patch placed behind your ear for the management of post- operative nausea and/or vomiting:  1. The medication in the patch is effective for 72 hours, after which it should be removed.  Wrap patch in a tissue and discard in the trash. Wash hands thoroughly with soap and water. 2. You may remove the patch earlier than 72 hours if you experience unpleasant side effects which may include dry mouth, dizziness or visual disturbances. 3. Avoid touching the patch. Wash your hands with soap and water after contact with the patch.

## 2020-02-29 ENCOUNTER — Encounter (HOSPITAL_BASED_OUTPATIENT_CLINIC_OR_DEPARTMENT_OTHER): Payer: Self-pay | Admitting: Surgery

## 2020-02-29 LAB — SURGICAL PATHOLOGY

## 2020-03-27 ENCOUNTER — Other Ambulatory Visit: Payer: Self-pay | Admitting: Gastroenterology

## 2020-04-01 ENCOUNTER — Encounter: Payer: Self-pay | Admitting: Family Medicine

## 2020-04-01 ENCOUNTER — Other Ambulatory Visit: Payer: Self-pay

## 2020-04-01 ENCOUNTER — Ambulatory Visit (INDEPENDENT_AMBULATORY_CARE_PROVIDER_SITE_OTHER): Payer: Medicaid Other | Admitting: Family Medicine

## 2020-04-01 VITALS — BP 138/88 | HR 80 | Temp 97.7°F | Resp 16 | Ht 65.5 in | Wt 166.0 lb

## 2020-04-01 DIAGNOSIS — F321 Major depressive disorder, single episode, moderate: Secondary | ICD-10-CM | POA: Insufficient documentation

## 2020-04-01 DIAGNOSIS — Z0001 Encounter for general adult medical examination with abnormal findings: Secondary | ICD-10-CM

## 2020-04-01 DIAGNOSIS — R030 Elevated blood-pressure reading, without diagnosis of hypertension: Secondary | ICD-10-CM | POA: Diagnosis not present

## 2020-04-01 DIAGNOSIS — Z1211 Encounter for screening for malignant neoplasm of colon: Secondary | ICD-10-CM | POA: Insufficient documentation

## 2020-04-01 DIAGNOSIS — F419 Anxiety disorder, unspecified: Secondary | ICD-10-CM

## 2020-04-01 DIAGNOSIS — E663 Overweight: Secondary | ICD-10-CM

## 2020-04-01 HISTORY — DX: Major depressive disorder, single episode, moderate: F32.1

## 2020-04-01 NOTE — Assessment & Plan Note (Signed)
Elevated blood pressure today however I think it has a lot to do with the conversation regarding need for therapy anxiety and depression.  Will be getting updated labs.  Encouraged her to maintain a low-salt diet and to get 30 to 60 minutes of daily exercise

## 2020-04-01 NOTE — Assessment & Plan Note (Signed)
Declined medication at this time.  Denies SI or HI.  Referral to therapy.

## 2020-04-01 NOTE — Assessment & Plan Note (Signed)
Referral made for therapy.  Declined medication Denies having any SI or HI.
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
## 2020-04-01 NOTE — Patient Instructions (Addendum)
I appreciate the opportunity to provide you with care for your health and wellness. Today we discussed: overall health  Follow up: 6 months or as needed  Labs fasting this week  Please continue to practice social distancing to keep you, your family, and our community safe.  If you must go out, please wear a mask and practice good handwashing.  It was a pleasure to see you and I look forward to continuing to work together on your health and well-being. Please do not hesitate to call the office if you need care or have questions about your care.  Have a wonderful day and week. With Gratitude, Tereasa Coop, DNP, AGNP-BC  HEALTH MAINTENANCE RECOMMENDATIONS:  It is recommended that you get at least 30 minutes of aerobic exercise at least 5 days/week (for weight loss, you may need as much as 60-90 minutes). This can be any activity that gets your heart rate up. This can be divided in 10-15 minute intervals if needed, but try and build up your endurance at least once a week.  Weight bearing exercise is also recommended twice weekly.  Eat a healthy diet with lots of vegetables, fruits and fiber.  "Colorful" foods have a lot of vitamins (ie green vegetables, tomatoes, red peppers, etc).  Limit sweet tea, regular sodas and alcoholic beverages, all of which has a lot of calories and sugar.  Up to 1 alcoholic drink daily may be beneficial for women (unless trying to lose weight, watch sugars).  Drink a lot of water.  Calcium recommendations are 1200-1500 mg daily (1500 mg for postmenopausal women or women without ovaries), and vitamin D 1000 IU daily.  This should be obtained from diet and/or supplements (vitamins), and calcium should not be taken all at once, but in divided doses.  Monthly self breast exams and yearly mammograms for women over the age of 21 is recommended.  Sunscreen of at least SPF 30 should be used on all sun-exposed parts of the skin when outside between the hours of 10 am and 4  pm (not just when at beach or pool, but even with exercise, golf, tennis, and yard work!)  Use a sunscreen that says "broad spectrum" so it covers both UVA and UVB rays, and make sure to reapply every 1-2 hours.  Remember to change the batteries in your smoke detectors when changing your clock times in the spring and fall.  Use your seat belt every time you are in a car, and please drive safely and not be distracted with cell phones and texting while driving.

## 2020-04-01 NOTE — Assessment & Plan Note (Signed)
stable  Carrah Eppolito Bilek is re-educated about the importance of exercise daily to help with weight management. A minumum of 30 minutes daily is recommended. Additionally, importance of healthy food choices  with portion control discussed.  Wt Readings from Last 3 Encounters:  04/01/20 166 lb (75.3 kg)  02/28/20 167 lb 15.9 oz (76.2 kg)  01/22/20 168 lb 6.4 oz (76.4 kg)

## 2020-04-01 NOTE — Assessment & Plan Note (Signed)
Discussed monthly self breast exams and yearly mammograms; at least 30 minutes of aerobic activity at least 5 days/week and weight-bearing exercise 2x/week; proper sunscreen use reviewed; healthy diet, including goals of calcium and vitamin D intake and alcohol recommendations (less than or equal to 1 drink/day) reviewed; regular seatbelt use; changing batteries in smoke detectors.  Immunization recommendations discussed.  Colonoscopy recommendations reviewed.  

## 2020-04-02 DIAGNOSIS — R030 Elevated blood-pressure reading, without diagnosis of hypertension: Secondary | ICD-10-CM | POA: Diagnosis not present

## 2020-04-02 DIAGNOSIS — Z0001 Encounter for general adult medical examination with abnormal findings: Secondary | ICD-10-CM | POA: Diagnosis not present

## 2020-04-03 LAB — CBC
Hematocrit: 44.1 % (ref 34.0–46.6)
Hemoglobin: 14.7 g/dL (ref 11.1–15.9)
MCH: 29.1 pg (ref 26.6–33.0)
MCHC: 33.3 g/dL (ref 31.5–35.7)
MCV: 87 fL (ref 79–97)
Platelets: 164 10*3/uL (ref 150–450)
RBC: 5.06 x10E6/uL (ref 3.77–5.28)
RDW: 12.7 % (ref 11.7–15.4)
WBC: 4.9 10*3/uL (ref 3.4–10.8)

## 2020-04-03 LAB — LIPID PANEL
Chol/HDL Ratio: 4 ratio (ref 0.0–4.4)
Cholesterol, Total: 230 mg/dL — ABNORMAL HIGH (ref 100–199)
HDL: 58 mg/dL (ref >39)
LDL Chol Calc (NIH): 144 mg/dL — ABNORMAL HIGH (ref 0–99)
Triglycerides: 156 mg/dL — ABNORMAL HIGH (ref 0–149)
VLDL Cholesterol Cal: 28 mg/dL (ref 5–40)

## 2020-04-03 LAB — VITAMIN D 25 HYDROXY (VIT D DEFICIENCY, FRACTURES): Vit D, 25-Hydroxy: 8.4 ng/mL — ABNORMAL LOW (ref 30.0–100.0)

## 2020-04-03 LAB — COMPREHENSIVE METABOLIC PANEL
ALT: 15 IU/L (ref 0–32)
AST: 19 IU/L (ref 0–40)
Albumin/Globulin Ratio: 1.6 (ref 1.2–2.2)
Albumin: 4.6 g/dL (ref 3.8–4.9)
Alkaline Phosphatase: 100 IU/L (ref 44–121)
BUN/Creatinine Ratio: 10 (ref 9–23)
BUN: 12 mg/dL (ref 6–24)
Bilirubin Total: 0.4 mg/dL (ref 0.0–1.2)
CO2: 25 mmol/L (ref 20–29)
Calcium: 9.6 mg/dL (ref 8.7–10.2)
Chloride: 103 mmol/L (ref 96–106)
Creatinine, Ser: 1.16 mg/dL — ABNORMAL HIGH (ref 0.57–1.00)
GFR calc Af Amer: 60 mL/min/{1.73_m2} (ref >59)
GFR calc non Af Amer: 52 mL/min/{1.73_m2} — ABNORMAL LOW (ref >59)
Globulin, Total: 2.8 g/dL (ref 1.5–4.5)
Glucose: 105 mg/dL — ABNORMAL HIGH (ref 65–99)
Potassium: 5.1 mmol/L (ref 3.5–5.2)
Sodium: 143 mmol/L (ref 134–144)
Total Protein: 7.4 g/dL (ref 6.0–8.5)

## 2020-04-03 LAB — TSH: TSH: 0.963 u[IU]/mL (ref 0.450–4.500)

## 2020-04-03 LAB — HEMOGLOBIN A1C
Est. average glucose Bld gHb Est-mCnc: 100 mg/dL
Hgb A1c MFr Bld: 5.1 % (ref 4.8–5.6)

## 2020-04-04 ENCOUNTER — Other Ambulatory Visit: Payer: Self-pay | Admitting: *Deleted

## 2020-04-04 DIAGNOSIS — F321 Major depressive disorder, single episode, moderate: Secondary | ICD-10-CM

## 2020-04-04 NOTE — Addendum Note (Signed)
Addended by: Abner Greenspan on: 04/04/2020 10:54 AM   Modules accepted: Orders

## 2020-04-09 ENCOUNTER — Other Ambulatory Visit: Payer: Self-pay | Admitting: Family Medicine

## 2020-04-09 DIAGNOSIS — I10 Essential (primary) hypertension: Secondary | ICD-10-CM

## 2020-04-09 DIAGNOSIS — E559 Vitamin D deficiency, unspecified: Secondary | ICD-10-CM

## 2020-04-09 MED ORDER — VITAMIN D (ERGOCALCIFEROL) 1.25 MG (50000 UNIT) PO CAPS: 50000.0000 [IU] | ORAL_CAPSULE | ORAL | 1 refills | Status: DC

## 2020-04-09 MED ORDER — LISINOPRIL 10 MG PO TABS: 10.0000 mg | ORAL_TABLET | Freq: Every day | ORAL | 1 refills | Status: DC

## 2020-04-11 ENCOUNTER — Other Ambulatory Visit: Payer: Self-pay | Admitting: *Deleted

## 2020-04-11 DIAGNOSIS — F321 Major depressive disorder, single episode, moderate: Secondary | ICD-10-CM

## 2020-04-18 DIAGNOSIS — R030 Elevated blood-pressure reading, without diagnosis of hypertension: Secondary | ICD-10-CM | POA: Diagnosis not present

## 2020-04-19 LAB — BMP8+EGFR
BUN/Creatinine Ratio: 11 (ref 9–23)
BUN: 12 mg/dL (ref 6–24)
CO2: 21 mmol/L (ref 20–29)
Calcium: 9.8 mg/dL (ref 8.7–10.2)
Chloride: 104 mmol/L (ref 96–106)
Creatinine, Ser: 1.08 mg/dL — ABNORMAL HIGH (ref 0.57–1.00)
GFR calc Af Amer: 65 mL/min/{1.73_m2} (ref >59)
GFR calc non Af Amer: 56 mL/min/{1.73_m2} — ABNORMAL LOW (ref >59)
Glucose: 94 mg/dL (ref 65–99)
Potassium: 4.5 mmol/L (ref 3.5–5.2)
Sodium: 144 mmol/L (ref 134–144)

## 2020-05-09 DIAGNOSIS — F319 Bipolar disorder, unspecified: Secondary | ICD-10-CM | POA: Diagnosis not present

## 2020-05-12 DIAGNOSIS — F319 Bipolar disorder, unspecified: Secondary | ICD-10-CM | POA: Diagnosis not present

## 2020-05-20 DIAGNOSIS — F319 Bipolar disorder, unspecified: Secondary | ICD-10-CM | POA: Diagnosis not present

## 2020-05-21 ENCOUNTER — Telehealth: Payer: Self-pay | Admitting: Family Medicine

## 2020-05-21 NOTE — Telephone Encounter (Signed)
Please call patient in regards to BP husband states with a new cuff he bought its showing 88/66 p# [phone]

## 2020-05-21 NOTE — Telephone Encounter (Signed)
Called pt and husband answered the phone, he said they were currently in the lobby at our office and someone was taking pts blood pressure. Spoke with Shana after blood pressure was taken, it was 120/73. Pt appt scheduled and informed.

## 2020-05-26 ENCOUNTER — Other Ambulatory Visit: Payer: Self-pay

## 2020-05-26 ENCOUNTER — Encounter: Payer: Self-pay | Admitting: Internal Medicine

## 2020-05-26 ENCOUNTER — Ambulatory Visit (INDEPENDENT_AMBULATORY_CARE_PROVIDER_SITE_OTHER): Payer: Medicaid Other | Admitting: Internal Medicine

## 2020-05-26 VITALS — BP 121/79 | HR 88 | Temp 98.2°F | Resp 16 | Ht 65.5 in | Wt 159.0 lb

## 2020-05-26 DIAGNOSIS — I10 Essential (primary) hypertension: Secondary | ICD-10-CM

## 2020-05-26 DIAGNOSIS — K219 Gastro-esophageal reflux disease without esophagitis: Secondary | ICD-10-CM

## 2020-05-26 DIAGNOSIS — F419 Anxiety disorder, unspecified: Secondary | ICD-10-CM | POA: Diagnosis not present

## 2020-05-26 DIAGNOSIS — E559 Vitamin D deficiency, unspecified: Secondary | ICD-10-CM

## 2020-05-26 HISTORY — DX: Essential (primary) hypertension: I10

## 2020-05-26 HISTORY — DX: Vitamin D deficiency, unspecified: E55.9

## 2020-05-26 NOTE — Patient Instructions (Signed)
Please continue to take medications as prescribed.  Please continue to follow low sodium diet and perform moderate exercise, at least 150 mins/week.  If you have dry cough or allergies that cause persistent cough or shortness of breath/wheezing, please call us or get medical attention.

## 2020-05-26 NOTE — Assessment & Plan Note (Signed)
BP Readings from Last 1 Encounters:  05/26/20 121/79   Well-controlled with Lisinopril Counseled for compliance with the medications Advised DASH diet and moderate exercise/walking, at least 150 mins/week

## 2020-05-26 NOTE — Assessment & Plan Note (Signed)
Last vitamin D Lab Results  Component Value Date   VD25OH 8.4 (L) 04/02/2020   On Vit D 50,000 IU every week

## 2020-05-26 NOTE — Assessment & Plan Note (Signed)
On Pantoprazole 

## 2020-05-26 NOTE — Assessment & Plan Note (Signed)
Patient has started behavioral therapy. Feels better now.

## 2020-05-26 NOTE — Progress Notes (Signed)
Established Patient Office Visit  Subjective:  Patient ID: Kathy Perry, female    DOB: March 11, 1961  Age: 59 y.o. MRN: [id number]  CC:  Chief Complaint  Patient presents with  . Follow-up    pt has been checking her bp at home and one day it was really low she gets anxious so she wanted to come in and make sure that is was ok     HPI Kathy Perry is a 58 year old female with PMH of HTN, anxiety and Vitamin D deficiency presents for follow up of her chronic medical conditions.  Patient checks her blood pressure at home.  She noticed low blood pressure once at home, around 96/64.  She denies any headache, dizziness, syncope or presyncope.  She wanted to make sure that her blood pressure is stable by checking it in the office.  She has started behavioral therapy for anxiety.  She states that she has been feeling better now.  Past Medical History:  Diagnosis Date  . Anxiety   . Arthritis   . IBS (irritable bowel syndrome)   . Need for Tdap vaccination 11/13/2019    Past Surgical History:  Procedure Laterality Date  . BREAST LUMPECTOMY WITH RADIOACTIVE SEED LOCALIZATION Left 02/28/2020   Procedure: LEFT BREAST LUMPECTOMY WITH RADIOACTIVE SEED LOCALIZATION;  Surgeon: Manus Rudd, MD;  Location:  [facility];  Service: General;  Laterality: Left;  LMA  . TUBAL LIGATION    . WISDOM TOOTH EXTRACTION      Family History  Problem Relation Age of Onset  . Diabetes Father   . Cancer Father        not sure primary  . Memory loss Mother   . Cancer Brother        thyroid  . COPD Brother   . Breast cancer Paternal Grandmother   . Breast cancer Paternal Aunt   . Lung cancer Paternal Aunt   . Colon cancer Neg Hx     Social History   Socioeconomic History  . Marital status: Married    Spouse name: Virl Diamond  . Number of children: 3  . Years of education: Not on file  . Highest education level: 9th grade  Occupational History  . Occupation: homemaker   Tobacco Use  . Smoking status: Never Smoker  . Smokeless tobacco: Never Used  Vaping Use  . Vaping Use: Never used  Substance and Sexual Activity  . Alcohol use: No  . Drug use: Never  . Sexual activity: Not Currently    Birth control/protection: Surgical, Post-menopausal    Comment: tubal  Other Topics Concern  . Not on file  Social History Narrative   L[facility] with [facility] years   1 son, 2 daughters   4 grandchildren       Cat: Lucy   Dog: Roofus       Enjoys: walking with dog, flowers, sewing, iron       Diet: eats all food groups-limited meat    Caffeine: some coke    Water: 2-4 cups of water      Wears seat belt   Smoke detectors at home    Social Determinants of Health   Financial Resource Strain: Low Risk   . Difficulty of Paying Living Expenses: Not hard at all  Food Insecurity: No Food Insecurity  . Worried About Programme researcher, broadcasting/film/video in the Last Year: Never true  . Ran Out of Food in the Last Year: Never true  Transportation  Needs: No Transportation Needs  . Lack of Transportation (Medical): No  . Lack of Transportation (Non-Medical): No  Physical Activity: Inactive  . Days of Exercise per Week: 0 days  . Minutes of Exercise per Session: 0 min  Stress: No Stress Concern Present  . Feeling of Stress : Not at all  Social Connections: Moderately Isolated  . Frequency of Communication with Friends and Family: More than three times a week  . Frequency of Social Gatherings with Friends and Family: More than three times a week  . Attends Religious Services: Never  . Active Member of Clubs or Organizations: No  . Attends Banker Meetings: Never  . Marital Status: Married  Catering manager Violence: Not At Risk  . Fear of Current or Ex-Partner: No  . Emotionally Abused: No  . Physically Abused: No  . Sexually Abused: No    Outpatient Medications Prior to Visit  Medication Sig Dispense Refill  . Aspirin-Acetaminophen-Caffeine (GOODYS  EXTRA STRENGTH) 500-325-65 MG PACK Take 1 Package by mouth daily as needed (pain).    Marland Kitchen lisinopril (ZESTRIL) 10 MG tablet Take 1 tablet (10 mg total) by mouth daily. 30 tablet 1  . pantoprazole (PROTONIX) 40 MG tablet TAKE ONE TABLET BY MOUTH DAILY BEFORE BREAKFAST. 30 tablet 5  . Vitamin D, Ergocalciferol, (DRISDOL) 1.25 MG (50000 UNIT) CAPS capsule Take 1 capsule (50,000 Units total) by mouth every 7 (seven) days. 12 capsule 1   No facility-administered medications prior to visit.    Allergies  Allergen Reactions  . Codeine Other (See Comments)    Chest pain    ROS Review of Systems  Constitutional: Negative for chills and fever.  HENT: Negative for congestion, sinus pressure, sinus pain and sore throat.   Eyes: Negative for pain and discharge.  Respiratory: Negative for cough and shortness of breath.   Cardiovascular: Negative for chest pain and palpitations.  Gastrointestinal: Negative for abdominal pain, constipation, diarrhea, nausea and vomiting.  Endocrine: Negative for polydipsia and polyuria.  Genitourinary: Negative for dysuria and hematuria.  Musculoskeletal: Negative for neck pain and neck stiffness.  Skin: Negative for rash.  Neurological: Negative for dizziness, syncope, weakness and headaches.  Psychiatric/Behavioral: Negative for agitation, behavioral problems and suicidal ideas. The patient is not nervous/anxious.       Objective:    Physical Exam Vitals reviewed.  Constitutional:      General: She is not in acute distress.    Appearance: She is not diaphoretic.  HENT:     Head: Normocephalic and atraumatic.     Nose: Nose normal. No congestion.     Mouth/Throat:     Mouth: Mucous membranes are moist.     Pharynx: No posterior oropharyngeal erythema.  Eyes:     General: No scleral icterus.    Extraocular Movements: Extraocular movements intact.     Pupils: Pupils are equal, round, and reactive to light.  Cardiovascular:     Rate and Rhythm: Normal  rate and regular rhythm.     Pulses: Normal pulses.     Heart sounds: Normal heart sounds. No murmur heard.   Pulmonary:     Breath sounds: Normal breath sounds. No wheezing or rales.  Abdominal:     Palpations: Abdomen is soft.     Tenderness: There is no abdominal tenderness.  Musculoskeletal:     Cervical back: Neck supple. No tenderness.     Right lower leg: No edema.     Left lower leg: No edema.  Skin:  General: Skin is warm.     Findings: No rash.  Neurological:     General: No focal deficit present.     Mental Status: She is alert and oriented to person, place, and time.     Sensory: No sensory deficit.     Motor: No weakness.  Psychiatric:        Mood and Affect: Mood normal.        Behavior: Behavior normal.     BP 121/79 (BP Location: Right Arm, Patient Position: Sitting, Cuff Size: Normal)   Pulse 88   Temp 98.2 F (36.8 C) (Oral)   Resp 16   Ht 5' 5.5" (1.664 m)   Wt 159 lb (72.1 kg)   LMP  (LMP Unknown)   SpO2 100%   BMI 26.06 kg/m  Wt Readings from Last 3 Encounters:  05/26/20 159 lb (72.1 kg)  04/01/20 166 lb (75.3 kg)  02/28/20 167 lb 15.9 oz (76.2 kg)     Health Maintenance Due  Topic Date Due  . Hepatitis C Screening  Never done  . HIV Screening  Never done    There are no preventive care reminders to display for this patient.  Lab Results  Component Value Date   TSH 0.963 04/02/2020   Lab Results  Component Value Date   WBC 4.9 04/02/2020   HGB 14.7 04/02/2020   HCT 44.1 04/02/2020   MCV 87 04/02/2020   PLT 164 04/02/2020   Lab Results  Component Value Date   NA 144 04/18/2020   K 4.5 04/18/2020   CO2 21 04/18/2020   GLUCOSE 94 04/18/2020   BUN 12 04/18/2020   CREATININE 1.08 (H) 04/18/2020   BILITOT 0.4 04/02/2020   ALKPHOS 100 04/02/2020   AST 19 04/02/2020   ALT 15 04/02/2020   PROT 7.4 04/02/2020   ALBUMIN 4.6 04/02/2020   CALCIUM 9.8 04/18/2020   ANIONGAP 13 12/13/2018   Lab Results  Component Value Date    CHOL 230 (H) 04/02/2020   Lab Results  Component Value Date   HDL 58 04/02/2020   Lab Results  Component Value Date   LDLCALC 144 (H) 04/02/2020   Lab Results  Component Value Date   TRIG 156 (H) 04/02/2020   Lab Results  Component Value Date   CHOLHDL 4.0 04/02/2020   Lab Results  Component Value Date   HGBA1C 5.1 04/02/2020      Assessment & Plan:   Problem List Items Addressed This Visit      Cardiovascular and Mediastinum   Primary hypertension - Primary    BP Readings from Last 1 Encounters:  05/26/20 121/79   Well-controlled with Lisinopril Counseled for compliance with the medications Advised DASH diet and moderate exercise/walking, at least 150 mins/week         Digestive   GERD (gastroesophageal reflux disease)    On Pantoprazole        Other   Anxiety    Patient has started behavioral therapy. Feels better now.      Vitamin D deficiency    Last vitamin D Lab Results  Component Value Date   VD25OH 8.4 (L) 04/02/2020   On Vit D 50,000 IU every week          No orders of the defined types were placed in this encounter.   Follow-up: Return in about 4 months (around 09/23/2020).    Anabel Halon, MD

## 2020-05-27 DIAGNOSIS — F319 Bipolar disorder, unspecified: Secondary | ICD-10-CM | POA: Diagnosis not present

## 2020-05-28 ENCOUNTER — Other Ambulatory Visit: Payer: Self-pay

## 2020-05-28 ENCOUNTER — Encounter: Payer: Self-pay | Admitting: Gastroenterology

## 2020-05-28 ENCOUNTER — Ambulatory Visit: Payer: Medicaid Other | Admitting: Gastroenterology

## 2020-05-28 VITALS — BP 105/67 | HR 91 | Temp 97.1°F | Ht 66.0 in | Wt 158.4 lb

## 2020-05-28 DIAGNOSIS — K219 Gastro-esophageal reflux disease without esophagitis: Secondary | ICD-10-CM

## 2020-05-28 DIAGNOSIS — K582 Mixed irritable bowel syndrome: Secondary | ICD-10-CM

## 2020-05-28 NOTE — Progress Notes (Signed)
Primary Care Physician: Freddy Finner, NP  Primary Gastroenterologist:  Hennie Duos. Marletta Lor, DO   Chief Complaint  Patient presents with  . Gastroesophageal Reflux    doing ok    HPI: Kathy Perry is a 59 y.o. female here for follow-up.  Seen back in July for dysphagia.  Patient declined upper endoscopy.  Barium esophagram was normal.  Declined colonoscopy last year, completed Cologuard which was negative.  Plans for repeat Cologuard in 2023.  She has a history of IBS with alternating constipation and diarrhea.  Doing well. Weight loss of 25 pounds with dietary changes and exercises. Really watching diet. Trying to drink more water to help with the kidneys. BM more regular for the most part. No melena, brbpr. Goody's once daily as needed. Does not take daily but can take several times per week.   Current Outpatient Medications  Medication Sig Dispense Refill  . Aspirin-Acetaminophen-Caffeine (GOODYS EXTRA STRENGTH) 500-325-65 MG PACK Take 1 Package by mouth daily as needed (pain).    . FIBER PO Take by mouth 2 (two) times daily. Chews 2 tablets twice a day    . lisinopril (ZESTRIL) 10 MG tablet Take 1 tablet (10 mg total) by mouth daily. 30 tablet 1  . pantoprazole (PROTONIX) 40 MG tablet TAKE ONE TABLET BY MOUTH DAILY BEFORE BREAKFAST. 30 tablet 5  . Vitamin D, Ergocalciferol, (DRISDOL) 1.25 MG (50000 UNIT) CAPS capsule Take 1 capsule (50,000 Units total) by mouth every 7 (seven) days. 12 capsule 1   No current facility-administered medications for this visit.    Allergies as of 05/28/2020 - Review Complete 05/28/2020  Allergen Reaction Noted  . Codeine Other (See Comments) 04/05/2015    ROS:  General: Negative for anorexia, unintentional weight loss, fever, chills, fatigue, weakness. ENT: Negative for hoarseness, difficulty swallowing , nasal congestion. CV: Negative for chest pain, angina, palpitations, dyspnea on exertion, peripheral edema.  Respiratory:  Negative for dyspnea at rest, dyspnea on exertion, cough, sputum, wheezing.  GI: See history of present illness. GU:  Negative for dysuria, hematuria, urinary incontinence, urinary frequency, nocturnal urination.  Endo: Negative for unusual weight change.    Physical Examination:   BP 105/67   Pulse 91   Temp (!) 97.1 F (36.2 C) (Temporal)   Ht 5\' 6"  (1.676 m)   Wt 158 lb 6.4 oz (71.8 kg)   LMP  (LMP Unknown)   BMI 25.57 kg/m   General: Well-nourished, well-developed in no acute distress.  Eyes: No icterus. Mouth: masked Lungs: Clear to auscultation bilaterally.  Heart: Regular rate and rhythm, no murmurs rubs or gallops.  Abdomen: Bowel sounds are normal, nontender, nondistended, no hepatosplenomegaly or masses, no abdominal bruits or hernia , no rebound or guarding.   Extremities: No lower extremity edema. No clubbing or deformities. Neuro: Alert and oriented x 4   Skin: Warm and dry, no jaundice.   Psych: Alert and cooperative, normal mood and affect.  Labs:  Lab Results  Component Value Date   WBC 4.9 04/02/2020   HGB 14.7 04/02/2020   HCT 44.1 04/02/2020   MCV 87 04/02/2020   PLT 164 04/02/2020   Lab Results  Component Value Date   ALT 15 04/02/2020   AST 19 04/02/2020   ALKPHOS 100 04/02/2020   BILITOT 0.4 04/02/2020   Lab Results  Component Value Date   CREATININE 1.08 (H) 04/18/2020   BUN 12 04/18/2020   NA 144 04/18/2020   K 4.5 04/18/2020  CL 104 04/18/2020   CO2 21 04/18/2020   Lab Results  Component Value Date   TSH 0.963 04/02/2020   Lab Results  Component Value Date   HGBA1C 5.1 04/02/2020     Imaging Studies: No results found.  Impression/plan:  59 year old female presenting for follow-up of GERD, dysphagia, IBS.  GERD: Doing well on pantoprazole 40 mg daily.  Dysphagia improved.  Barium esophagram was unremarkable.  Patient declined upper endoscopy.  Continue antireflux measures.  Patient has made significant efforts with dietary  changes and has lost 25 pounds.  She has cut out sugary drinks, limited carbs significantly, decrease portion size.  Would encourage her to further decrease Goody's powders use.  IBS with alternating constipation and diarrhea, clinically doing well on fiber daily.  Return to the office in 1 year or call sooner if needed.

## 2020-05-28 NOTE — Progress Notes (Signed)
Cc'ed to pcp °

## 2020-05-28 NOTE — Patient Instructions (Signed)
1. Continue pantoprazole 40 mg daily. 2. Continue to cut back on Goody's aspirin powder use.  Goody's powders usually have combination of aspirin, acetaminophen, caffeine.  Aspirin powders can cause ulcers.  Consider trying another type of pain relief such as Tylenol/acetaminophen.  Do not mix Tylenol with Goody's powders as both products have acetaminophen in it. 3. Congratulations on your dietary changes and weight loss.  4. Return to the office in 1 year or call sooner if needed.

## 2020-06-05 ENCOUNTER — Other Ambulatory Visit: Payer: Self-pay | Admitting: Family Medicine

## 2020-06-05 DIAGNOSIS — F319 Bipolar disorder, unspecified: Secondary | ICD-10-CM | POA: Diagnosis not present

## 2020-06-05 DIAGNOSIS — I10 Essential (primary) hypertension: Secondary | ICD-10-CM

## 2020-06-10 DIAGNOSIS — F319 Bipolar disorder, unspecified: Secondary | ICD-10-CM | POA: Diagnosis not present

## 2020-06-11 DIAGNOSIS — H5213 Myopia, bilateral: Secondary | ICD-10-CM | POA: Diagnosis not present

## 2020-06-17 DIAGNOSIS — F319 Bipolar disorder, unspecified: Secondary | ICD-10-CM | POA: Diagnosis not present

## 2020-06-20 ENCOUNTER — Other Ambulatory Visit: Payer: Self-pay | Admitting: Family Medicine

## 2020-06-20 DIAGNOSIS — I10 Essential (primary) hypertension: Secondary | ICD-10-CM

## 2020-06-23 ENCOUNTER — Telehealth (INDEPENDENT_AMBULATORY_CARE_PROVIDER_SITE_OTHER): Payer: Medicaid Other | Admitting: Internal Medicine

## 2020-06-23 ENCOUNTER — Other Ambulatory Visit: Payer: Self-pay

## 2020-06-23 ENCOUNTER — Encounter: Payer: Self-pay | Admitting: Internal Medicine

## 2020-06-23 DIAGNOSIS — R5383 Other fatigue: Secondary | ICD-10-CM

## 2020-06-23 DIAGNOSIS — R059 Cough, unspecified: Secondary | ICD-10-CM | POA: Diagnosis not present

## 2020-06-23 NOTE — Patient Instructions (Signed)
Please use humidifier at nighttime to help with the dry air.  Please do salt water or warm water gargles to help with the sore throat/dry throat.  Your cough is most likely related to postinfectious cough, which can take up to 4-6 weeks to resolve.  Please continue to take your medications as prescribed.

## 2020-06-23 NOTE — Progress Notes (Addendum)
Virtual Visit via Telephone Note   This visit type was conducted due to national recommendations for restrictions regarding the COVID-19 Pandemic (e.g. social distancing) in an effort to limit this patient's exposure and mitigate transmission in our community.  Due to her co-morbid illnesses, this patient is at least at moderate risk for complications without adequate follow up.  This format is felt to be most appropriate for this patient at this time.  The patient did not have access to video technology/had technical difficulties with video requiring transitioning to audio format only (telephone).  All issues noted in this document were discussed and addressed.  No physical exam could be performed with this format.  Evaluation Performed:  Follow-up visit  Date:  06/23/2020   ID:  Kathy Perry, Kathy Perry [birth year], MRN [id number]  Patient Location: Home Provider Location: Office/Clinic  Participants: Patient Location of Patient: Home Location of Provider: Telehealth Consent was obtain for visit to be over via telehealth. I verified that I am speaking with the correct person using two identifiers.  PCP:  Freddy Finner, NP   Chief Complaint:  Cough and dry throat  History of Present Illness:    Kathy Perry is a 59 y.o. female with PMH of HTN, anxiety and Vitamin D deficiency has a televisit for c/o dry cough and dry throat.  Patient c/o cough and dry throat for about 3 weeks.  She states that she had most symptoms earlier, but have been feeling better now.  She denies any fever, chills, runny nose, change taste or smell sensation, nausea, vomiting.  She denies any chest pain, dyspnea, wheezing or palpitations.  She denies any recent sick contacts.  She also complains of chronic fatigue.  She denies any constipation, hair or nail changes, or recent weight change.  Of note, she takes vitamin D tablets for vitamin D deficiency.  The patient does not have symptoms concerning  for COVID-19 infection (fever, chills, cough, or new shortness of breath).   Past Medical, Surgical, Social History, Allergies, and Medications have been Reviewed.  Past Medical History:  Diagnosis Date  . Anxiety   . Arthritis   . IBS (irritable bowel syndrome)   . Need for Tdap vaccination 11/13/2019   Past Surgical History:  Procedure Laterality Date  . BREAST LUMPECTOMY WITH RADIOACTIVE SEED LOCALIZATION Left 02/28/2020   Procedure: LEFT BREAST LUMPECTOMY WITH RADIOACTIVE SEED LOCALIZATION;  Surgeon: Manus Rudd, MD;  Location: [facility];  Service: General;  Laterality: Left;  LMA  . TUBAL LIGATION    . WISDOM TOOTH EXTRACTION       Current Meds  Medication Sig  . Aspirin-Acetaminophen-Caffeine (GOODYS EXTRA STRENGTH) 500-325-65 MG PACK Take 1 Package by mouth daily as needed (pain).  . FIBER PO Take by mouth 2 (two) times daily. Chews 2 tablets twice a day  . lisinopril (ZESTRIL) 10 MG tablet Take 1 tablet (10 mg total) by mouth daily.  . pantoprazole (PROTONIX) 40 MG tablet TAKE ONE TABLET BY MOUTH DAILY BEFORE BREAKFAST.  Marland Kitchen Vitamin D, Ergocalciferol, (DRISDOL) 1.25 MG (50000 UNIT) CAPS capsule Take 1 capsule (50,000 Units total) by mouth every 7 (seven) days.     Allergies:   Codeine   ROS:   Please see the history of present illness.     All other systems reviewed and are negative.   Labs/Other Tests and Data Reviewed:    Recent Labs: 04/02/2020: ALT 15; Hemoglobin 14.7; Platelets 164; TSH 0.963 04/18/2020: BUN 12; Creatinine,  Ser 1.08; Potassium 4.5; Sodium 144   Recent Lipid Panel Lab Results  Component Value Date/Time   CHOL 230 (H) 04/02/2020 10:58 AM   TRIG 156 (H) 04/02/2020 10:58 AM   HDL 58 04/02/2020 10:58 AM   CHOLHDL 4.0 04/02/2020 10:58 AM   LDLCALC 144 (H) 04/02/2020 10:58 AM    Wt Readings from Last 3 Encounters:  05/28/20 158 lb 6.4 oz (71.8 kg)  05/26/20 159 lb (72.1 kg)  04/01/20 166 lb (75.3 kg)     ASSESSMENT & PLAN:     Cough, likely postinfectious Symptomatic treatment -salt water or warm water gargles, use of humidifier for dry air Educated patient about the course of the postinfectious cough NSAIDs as needed  Chronic fatigue TSH WNL Low vitamin D likely contributing to fatigue -continue vitamin D 50,000 IU every week Advised to check BP at home     Time:   Today, I have spent 12 minutes reviewing the chart, including problem list, medications, and with the patient with telehealth technology discussing the above problems.   Medication Adjustments/Labs and Tests Ordered: Current medicines are reviewed at length with the patient today.  Concerns regarding medicines are outlined above.   Tests Ordered: No orders of the defined types were placed in this encounter.   Medication Changes: No orders of the defined types were placed in this encounter.    Note: This dictation was prepared with Dragon dictation along with smaller phrase technology. Similar sounding words can be transcribed inadequately or may not be corrected upon review. Any transcriptional errors that result from this process are unintentional.      Disposition:  Follow up  Signed, Anabel Halon, MD  06/23/2020 9:27 AM     [facility]

## 2020-06-24 DIAGNOSIS — F319 Bipolar disorder, unspecified: Secondary | ICD-10-CM | POA: Diagnosis not present

## 2020-07-01 DIAGNOSIS — F319 Bipolar disorder, unspecified: Secondary | ICD-10-CM | POA: Diagnosis not present

## 2020-07-04 ENCOUNTER — Other Ambulatory Visit: Payer: Self-pay

## 2020-07-04 ENCOUNTER — Ambulatory Visit
Admission: EM | Admit: 2020-07-04 | Discharge: 2020-07-04 | Disposition: A | Discharge status: Home / Self Care | Payer: Medicaid Other | Attending: Emergency Medicine | Admitting: Emergency Medicine

## 2020-07-04 DIAGNOSIS — I10 Essential (primary) hypertension: Secondary | ICD-10-CM

## 2020-07-04 DIAGNOSIS — R55 Syncope and collapse: Secondary | ICD-10-CM | POA: Diagnosis not present

## 2020-07-04 LAB — POCT FASTING CBG KUC MANUAL ENTRY: POCT Glucose (KUC): 93 mg/dL (ref 70–99)

## 2020-07-04 MED ORDER — LISINOPRIL 10 MG PO TABS: 10.0000 mg | ORAL_TABLET | Freq: Every day | ORAL | 1 refills | Status: DC

## 2020-07-04 NOTE — ED Provider Notes (Signed)
[facility]   [id number] 07/04/20 Arrival Time: 1317  VQ:[id number]  SUBJECTIVE:  Kathy Perry is a 59 y.o. female who presents with complaint of feeling like she is going to faint that occurred today.  Denies a precipitating event, trauma.  Reported recent URI symptoms.  Describes  it as feeling like she is going to blackout.  Stated was intermittent and lasted few minutes.  Has not tried any OTC medication.  Denies alleviating or aggravating factors.  Denies similar symptoms in the past.  Denies fever, chills, nausea, vomiting, hearing changes, tinnitus, ear pain, chest pain,  SOB, weakness, slurred speech, memory or emotional changes, facial drooping/ asymmetry, incoordination, numbness or tingling, abdominal pain, changes in bowel or bladder habits.     ROS: As per HPI.  All other pertinent ROS negative.      Past Medical History:  Diagnosis Date   Anxiety    Arthritis    IBS (irritable bowel syndrome)    Need for Tdap vaccination 11/13/2019   Past Surgical History:  Procedure Laterality Date   BREAST LUMPECTOMY WITH RADIOACTIVE SEED LOCALIZATION Left 02/28/2020   Procedure: LEFT BREAST LUMPECTOMY WITH RADIOACTIVE SEED LOCALIZATION;  Surgeon: Manus Rudd, MD;  Location: [facility];  Service: General;  Laterality: Left;  LMA   TUBAL LIGATION     WISDOM TOOTH EXTRACTION     Allergies  Allergen Reactions   Codeine Other (See Comments)    Chest pain   No current facility-administered medications on file prior to encounter.   Current Outpatient Medications on File Prior to Encounter  Medication Sig Dispense Refill   Aspirin-Acetaminophen-Caffeine (GOODYS EXTRA STRENGTH) 500-325-65 MG PACK Take 1 Package by mouth daily as needed (pain).     FIBER PO Take by mouth 2 (two) times daily. Chews 2 tablets twice a day     lisinopril (ZESTRIL) 10 MG tablet Take 1 tablet (10 mg total) by mouth daily. 30 tablet 1   pantoprazole (PROTONIX) 40  MG tablet TAKE ONE TABLET BY MOUTH DAILY BEFORE BREAKFAST. 30 tablet 5   Vitamin D, Ergocalciferol, (DRISDOL) 1.25 MG (50000 UNIT) CAPS capsule Take 1 capsule (50,000 Units total) by mouth every 7 (seven) days. 12 capsule 1   Social History   Socioeconomic History   Marital status: Married    Spouse name: Actor   Number of children: 3   Years of education: Not on file   Highest education level: 9th grade  Occupational History   Occupation: homemaker  Tobacco Use   Smoking status: Never Smoker   Smokeless tobacco: Never Used  Building services engineer Use: Never used  Substance and Sexual Activity   Alcohol use: No   Drug use: Never   Sexual activity: Not Currently    Birth control/protection: Surgical, Post-menopausal    Comment: tubal  Other Topics Concern   Not on file  Social History Narrative   Lives with [place] 5[place] 40 years   1 son, 2 daughters   4 grandchildren       Cat: Lucy   Dog: Roofus       Enjoys: walking with dog, flowers, sewing, iron       Diet: eats all food groups-limited meat    Caffeine: some coke    Water: 2-4 cups of water      Wears seat belt   Smoke detectors at home    Social Determinants of Health   Financial Resource Strain: Low Risk    Difficulty  of Paying Living Expenses: Not hard at all  Food Insecurity: No Food Insecurity   Worried About Running Out of Food in the Last Year: Never true   Ran Out of Food in the Last Year: Never true  Transportation Needs: No Transportation Needs   Lack of Transportation (Medical): No   Lack of Transportation (Non-Medical): No  Physical Activity: Inactive   Days of Exercise per Week: 0 days   Minutes of Exercise per Session: 0 min  Stress: No Stress Concern Present   Feeling of Stress : Not at all  Social Connections: Moderately Isolated   Frequency of Communication with Friends and Family: More than three times a week   Frequency of Social Gatherings with Friends and Family:  More than three times a week   Attends Religious Services: Never   Database administrator or Organizations: No   Attends Engineer, structural: Never   Marital Status: Married  Catering manager Violence: Not At Risk   Fear of Current or Ex-Partner: No   Emotionally Abused: No   Physically Abused: No   Sexually Abused: No   Family History  Problem Relation Age of Onset   Diabetes Father    Cancer Father        not sure primary   Memory loss Mother    Cancer Brother        thyroid   COPD Brother    Breast cancer Paternal Grandmother    Breast cancer Paternal Aunt    Lung cancer Paternal Aunt    Colon cancer Neg Hx     OBJECTIVE:  Vitals:   07/04/20 1414  BP: 114/72  Pulse: 91  Resp: 16  Temp: 97.9 F (36.6 C)  SpO2: 99%    General appearance: alert; no distress Eyes: PERRLA; EOMI; conjunctiva normal HENT: normocephalic; atraumatic; TMs normal; nasal mucosa normal; oral mucosa normal Neck: supple with FROM Lungs: clear to auscultation bilaterally Heart: regular rate and rhythm Abdomen: soft, non-tender; bowel sounds normal Extremities: no cyanosis or edema; symmetrical with no gross deformities Skin: warm and dry Neurologic: normal gait; normal symmetric reflexes; CN 2-12 grossly intact Psychological: alert and cooperative; normal mood and affect  Labs:  Results for orders placed or performed during the hospital encounter of 07/04/20 (from the past 24 hour(s))  POCT CBG (manual entry)     Status: None   Collection Time: 07/04/20  3:10 PM  Result Value Ref Range   POCT Glucose ([facility]) 93 70 - 99 mg/dL   Orders placed or performed during the hospital encounter of 07/04/20   ED EKG   ED EKG    No results found.  ASSESSMENT & PLAN:  1. Near syncope     No orders of the defined types were placed in this encounter.   Discharge instructions  ED EKG showed normal sinus rhythm Fingerstick blood sugar is 93 Rest and push fluids Do  not skip meal Follow-up with PCP for further reevaluation Return or go to ED if you develop any new or worsening symptoms  Reviewed expectations re: course of current medical issues. Questions answered. Outlined signs and symptoms indicating need for more acute intervention. Patient verbalized understanding. After Visit Summary given.    Durward Parcel, FNP 07/04/20 1514

## 2020-07-04 NOTE — Discharge Instructions (Signed)
ED EKG showed normal sinus rhythm Fingerstick blood sugar is 93 Rest and push fluids Do not skip meal Follow-up with PCP for further reevaluation Return or go to ED if you develop any new or worsening symptoms

## 2020-07-04 NOTE — ED Triage Notes (Signed)
Pt presents with feeling of light headedness that began today, reports feeling hot and cold yesterday

## 2020-07-07 ENCOUNTER — Other Ambulatory Visit: Payer: Self-pay

## 2020-07-07 ENCOUNTER — Encounter: Payer: Self-pay | Admitting: Nurse Practitioner

## 2020-07-07 ENCOUNTER — Ambulatory Visit: Payer: Medicaid Other | Admitting: Nurse Practitioner

## 2020-07-07 DIAGNOSIS — F419 Anxiety disorder, unspecified: Secondary | ICD-10-CM

## 2020-07-07 MED ORDER — SERTRALINE HCL 50 MG PO TABS
50.0000 mg | ORAL_TABLET | Freq: Every day | ORAL | 3 refills | Status: DC
Start: 2020-07-07 — End: 2020-08-12

## 2020-07-07 NOTE — Progress Notes (Signed)
Acute Office Visit  Subjective:    Patient ID: Mahala Rommel, female    DOB: 09-03-1960, 59 y.o.   MRN: [id number]  Chief Complaint  Patient presents with  . Cough  . Hot Flashes  . Eye Problem    Seeing black spots x 4 days ago     HPI Patient is in today for seeing black spots 4 days ago.  This resolved shortly. She has hx of anxiety, and she may have bene triggered while shopping with her husband.  SHe was at [facility] when she started seeing spots. SHe went to urgent care, and EKG and work-up there was negative.  She sees counseling currently for anxiety.  Past Medical History:  Diagnosis Date  . Anxiety   . Arthritis   . IBS (irritable bowel syndrome)   . Need for Tdap vaccination 11/13/2019    Past Surgical History:  Procedure Laterality Date  . BREAST LUMPECTOMY WITH RADIOACTIVE SEED LOCALIZATION Left 02/28/2020   Procedure: LEFT BREAST LUMPECTOMY WITH RADIOACTIVE SEED LOCALIZATION;  Surgeon: Manus Rudd, MD;  Location: [facility];  Service: General;  Laterality: Left;  LMA  . TUBAL LIGATION    . WISDOM TOOTH EXTRACTION      Family History  Problem Relation Age of Onset  . Diabetes Father   . Cancer Father        not sure primary  . Memory loss Mother   . Cancer Brother        thyroid  . COPD Brother   . Breast cancer Paternal Grandmother   . Breast cancer Paternal Aunt   . Lung cancer Paternal Aunt   . Colon cancer Neg Hx     Social History   Socioeconomic History  . Marital status: Married    Spouse name: Virl Diamond  . Number of children: 3  . Years of education: Not on file  . Highest education level: 9th grade  Occupational History  . Occupation: homemaker  Tobacco Use  . Smoking status: Never Smoker  . Smokeless tobacco: Never Used  Vaping Use  . Vaping Use: Never used  Substance and Sexual Activity  . Alcohol use: No  . Drug use: Never  . Sexual activity: Not Currently    Birth control/protection: Surgical,  Post-menopausal    Comment: tubal  Other Topics Concern  . Not on file  Social History Narrative   Lives with [facility] 40 years   1 son, 2 daughters   4 grandchildren       Cat: Lucy   Dog: Roofus       Enjoys: walking with dog, flowers, sewing, iron       Diet: eats all food groups-limited meat    Caffeine: some coke    Water: 2-4 cups of water      Wears seat belt   Smoke detectors at home    Social Determinants of Health   Financial Resource Strain: Low Risk   . Difficulty of Paying Living Expenses: Not hard at all  Food Insecurity: No Food Insecurity  . Worried About Programme researcher, broadcasting/film/video in the Last Year: Never true  . Ran Out of Food in the Last Year: Never true  Transportation Needs: No Transportation Needs  . Lack of Transportation (Medical): No  . Lack of Transportation (Non-Medical): No  Physical Activity: Inactive  . Days of Exercise per Week: 0 days  . Minutes of Exercise per Session: 0 min  Stress: No Stress Concern Present  .  Feeling of Stress : Not at all  Social Connections: Moderately Isolated  . Frequency of Communication with Friends and Family: More than three times a week  . Frequency of Social Gatherings with Friends and Family: More than three times a week  . Attends Religious Services: Never  . Active Member of Clubs or Organizations: No  . Attends Banker Meetings: Never  . Marital Status: Married  Catering manager Violence: Not At Risk  . Fear of Current or Ex-Partner: No  . Emotionally Abused: No  . Physically Abused: No  . Sexually Abused: No    Outpatient Medications Prior to Visit  Medication Sig Dispense Refill  . Aspirin-Acetaminophen-Caffeine (GOODYS EXTRA STRENGTH) 500-325-65 MG PACK Take 1 Package by mouth daily as needed (pain).    . FIBER PO Take by mouth 2 (two) times daily. Chews 2 tablets twice a day    . lisinopril (ZESTRIL) 10 MG tablet Take 1 tablet (10 mg total) by mouth daily. 30 tablet 1  .  pantoprazole (PROTONIX) 40 MG tablet TAKE ONE TABLET BY MOUTH DAILY BEFORE BREAKFAST. 30 tablet 5  . Vitamin D, Ergocalciferol, (DRISDOL) 1.25 MG (50000 UNIT) CAPS capsule Take 1 capsule (50,000 Units total) by mouth every 7 (seven) days. 12 capsule 1   No facility-administered medications prior to visit.    Allergies  Allergen Reactions  . Codeine Other (See Comments)    Chest pain    Review of Systems  Constitutional: Negative.   Eyes:       Saw black spots on Friday, but that resolved  Respiratory: Negative.   Cardiovascular: Negative.   Psychiatric/Behavioral: Negative for self-injury and suicidal ideas. The patient is nervous/anxious.        Objective:    Physical Exam Constitutional:      Appearance: Normal appearance.  Cardiovascular:     Rate and Rhythm: Normal rate and regular rhythm.     Pulses: Normal pulses.     Heart sounds: Normal heart sounds.  Pulmonary:     Effort: Pulmonary effort is normal.     Breath sounds: Normal breath sounds.  Neurological:     Mental Status: She is alert.  Psychiatric:     Comments: GAD-7 = 17     LMP  (LMP Unknown)  Wt Readings from Last 3 Encounters:  05/28/20 158 lb 6.4 oz (71.8 kg)  05/26/20 159 lb (72.1 kg)  04/01/20 166 lb (75.3 kg)    Health Maintenance Due  Topic Date Due  . Hepatitis C Screening  Never done  . HIV Screening  Never done    There are no preventive care reminders to display for this patient.   Lab Results  Component Value Date   TSH 0.963 04/02/2020   Lab Results  Component Value Date   WBC 4.9 04/02/2020   HGB 14.7 04/02/2020   HCT 44.1 04/02/2020   MCV 87 04/02/2020   PLT 164 04/02/2020   Lab Results  Component Value Date   NA 144 04/18/2020   K 4.5 04/18/2020   CO2 21 04/18/2020   GLUCOSE 94 04/18/2020   BUN 12 04/18/2020   CREATININE 1.08 (H) 04/18/2020   BILITOT 0.4 04/02/2020   ALKPHOS 100 04/02/2020   AST 19 04/02/2020   ALT 15 04/02/2020   PROT 7.4 04/02/2020    ALBUMIN 4.6 04/02/2020   CALCIUM 9.8 04/18/2020   ANIONGAP 13 12/13/2018   Lab Results  Component Value Date   CHOL 230 (H) 04/02/2020   Lab  Results  Component Value Date   HDL 58 04/02/2020   Lab Results  Component Value Date   LDLCALC 144 (H) 04/02/2020   Lab Results  Component Value Date   TRIG 156 (H) 04/02/2020   Lab Results  Component Value Date   CHOLHDL 4.0 04/02/2020   Lab Results  Component Value Date   HGBA1C 5.1 04/02/2020       Assessment & Plan:   Problem List Items Addressed This Visit      Other   Anxiety    -GAD-7 = 17, indicating severe anxiety -currently seeing therapy -Rx. sertraline      Relevant Medications   sertraline (ZOLOFT) 50 MG tablet       Meds ordered this encounter  Medications  . sertraline (ZOLOFT) 50 MG tablet    Sig: Take 1 tablet (50 mg total) by mouth daily.    Dispense:  30 tablet    Refill:  3     Heather Roberts, NP

## 2020-07-07 NOTE — Assessment & Plan Note (Signed)
-  GAD-7 = 17, indicating severe anxiety -currently seeing therapy -Rx. sertraline

## 2020-07-08 ENCOUNTER — Telehealth: Payer: Self-pay | Admitting: *Deleted

## 2020-07-08 ENCOUNTER — Other Ambulatory Visit: Payer: Self-pay | Admitting: Nurse Practitioner

## 2020-07-08 DIAGNOSIS — F319 Bipolar disorder, unspecified: Secondary | ICD-10-CM | POA: Diagnosis not present

## 2020-07-08 MED ORDER — HYDROXYZINE PAMOATE 50 MG PO CAPS: 50.0000 mg | ORAL_CAPSULE | Freq: Two times a day (BID) | ORAL | 0 refills | Status: DC | PRN

## 2020-07-08 NOTE — Telephone Encounter (Signed)
I sent in hydroxyzine for her to use PRN for anxiety.

## 2020-07-08 NOTE — Telephone Encounter (Signed)
Pt was prescribed zoloft yesterday by michael gray she does not feel like she needs this as she feels its too much and this would make her feel like a zombie. Wanted to know if you could send in something different for a one time dose when she has attacks. Please advise

## 2020-07-09 NOTE — Telephone Encounter (Signed)
Pt notified with verbal understanding  °

## 2020-07-14 DIAGNOSIS — F319 Bipolar disorder, unspecified: Secondary | ICD-10-CM | POA: Diagnosis not present

## 2020-07-22 DIAGNOSIS — F319 Bipolar disorder, unspecified: Secondary | ICD-10-CM | POA: Diagnosis not present

## 2020-07-23 ENCOUNTER — Encounter: Payer: Self-pay | Admitting: Family Medicine

## 2020-07-23 ENCOUNTER — Telehealth (INDEPENDENT_AMBULATORY_CARE_PROVIDER_SITE_OTHER): Payer: Medicaid Other | Admitting: Family Medicine

## 2020-07-23 ENCOUNTER — Other Ambulatory Visit: Payer: Self-pay

## 2020-07-23 DIAGNOSIS — K219 Gastro-esophageal reflux disease without esophagitis: Secondary | ICD-10-CM | POA: Diagnosis not present

## 2020-07-23 DIAGNOSIS — R1319 Other dysphagia: Secondary | ICD-10-CM

## 2020-07-23 MED ORDER — PANTOPRAZOLE SODIUM 40 MG PO TBEC: 40.0000 mg | DELAYED_RELEASE_TABLET | Freq: Two times a day (BID) | ORAL | 5 refills | Status: DC

## 2020-07-23 NOTE — Patient Instructions (Signed)
  I appreciate the opportunity to provide you with care for your health and wellness.  Follow up:  As scheduled   No labs or referrals today  Increase protonix to twice daily as we discussed  Reach out to GI to make sure this is not related to something they can treat.  Keep throat lubricated with water or fluids. Can use sugar free candy to help if dry throat is the only cause.  Please continue to practice social distancing to keep you, your family, and our community safe.  If you must go out, please wear a mask and practice good handwashing.  It was a pleasure to see you and I look forward to continuing to work together on your health and well-being. Please do not hesitate to call the office if you need care or have questions about your care.  Have a wonderful day. With Gratitude, Tereasa Coop, DNP, AGNP-BC

## 2020-07-23 NOTE — Assessment & Plan Note (Signed)
I have advised she call GI back and be seen for this again. It could be stress related, but would like them to r/o I have increased her PPI BID until she is seen to see if that is helpful.   

## 2020-07-23 NOTE — Progress Notes (Signed)
Virtual Visit via Telephone Note   This visit type was conducted due to national recommendations for restrictions regarding the COVID-19 Pandemic (e.g. social distancing) in an effort to limit this patient's exposure and mitigate transmission in our community.  Due to her co-morbid illnesses, this patient is at least at moderate risk for complications without adequate follow up.  This format is felt to be most appropriate for this patient at this time.  The patient did not have access to video technology/had technical difficulties with video requiring transitioning to audio format only (telephone).  All issues noted in this document were discussed and addressed.  No physical exam could be performed with this format.     Evaluation Performed:  Follow-up visit  Date:  07/23/2020   ID:  Kathy, Perry [birth year], MRN [id number]  Patient Location: Home Provider Location: Office/Clinic   Participants: Nurse for intake and work up; Patient and Provider for Visit and Wrap up  Method of visit: Telephone Location of Patient: Home Location of Provider: Office Consent was obtain for visit over the telephone. Services rendered by provider: Visit was performed via telephone  I verified that I am speaking with the correct person using two identifiers.  PCP:  Kathy Finner, NP   Chief Complaint:  Cough   History of Present Illness:    Kathy Perry is a 60 y.o. female with history as stated below. She presents for an acute visit via the phone due to an on going cough. She reports this cough off and on for the last 2 months. It will go away then start back up when she feels like her throat is dry. She is treated for GERD on Protonix- reports taking as directed. She has been worked up in the past for esophageal dysphagia as well- Barium was unremarkable. PPI was continued and EGD was suggested in Spring of last year if symptoms did not improve. She has having choking and  sensation of food not going down. She reports they said it was stress related due to her anxiety not being well controlled. She has history of breast cancer and reports getting worried when things happen with her system or body.   She does productive cough or GERD like symptoms. Is willing to follow up with GI. She is in therapy and on Zoloft at this time for her depression and anxiety.   She denies chest pain, shortness of breath, sinus issues, dizziness, headaches, recent illness ,or other changes that might be associated with the cough.  The patient does not have symptoms concerning for COVID-19 infection (fever, chills, cough, or new shortness of breath).   Past Medical, Surgical, Social History, Allergies, and Medications have been Reviewed.  Past Medical History:  Diagnosis Date  . Anxiety   . Arthritis   . IBS (irritable bowel syndrome)   . Need for Tdap vaccination 11/13/2019   Past Surgical History:  Procedure Laterality Date  . BREAST LUMPECTOMY WITH RADIOACTIVE SEED LOCALIZATION Left 02/28/2020   Procedure: LEFT BREAST LUMPECTOMY WITH RADIOACTIVE SEED LOCALIZATION;  Surgeon: Manus Rudd, MD;  Location: [facility];  Service: General;  Laterality: Left;  LMA  . TUBAL LIGATION    . WISDOM TOOTH EXTRACTION       Current Meds  Medication Sig  . Aspirin-Acetaminophen-Caffeine (GOODYS EXTRA STRENGTH) 500-325-65 MG PACK Take 1 Package by mouth daily as needed (pain).  . FIBER PO Take by mouth 2 (two) times daily. Chews 2 tablets twice  a day  . hydrOXYzine (VISTARIL) 50 MG capsule Take 1 capsule (50 mg total) by mouth 2 (two) times daily as needed for anxiety.  Marland Kitchen lisinopril (ZESTRIL) 10 MG tablet Take 1 tablet (10 mg total) by mouth daily.  . pantoprazole (PROTONIX) 40 MG tablet TAKE ONE TABLET BY MOUTH DAILY BEFORE BREAKFAST.  Marland Kitchen sertraline (ZOLOFT) 50 MG tablet Take 1 tablet (50 mg total) by mouth daily.  . Vitamin D, Ergocalciferol, (DRISDOL) 1.25 MG (50000 UNIT)  CAPS capsule Take 1 capsule (50,000 Units total) by mouth every 7 (seven) days.     Allergies:   Codeine   ROS:   Please see the history of present illness.    All other systems reviewed and are negative.   Labs/Other Tests and Data Reviewed:    Recent Labs: 04/02/2020: ALT 15; Hemoglobin 14.7; Platelets 164; TSH 0.963 04/18/2020: BUN 12; Creatinine, Ser 1.08; Potassium 4.5; Sodium 144   Recent Lipid Panel Lab Results  Component Value Date/Time   CHOL 230 (H) 04/02/2020 10:58 AM   TRIG 156 (H) 04/02/2020 10:58 AM   HDL 58 04/02/2020 10:58 AM   CHOLHDL 4.0 04/02/2020 10:58 AM   LDLCALC 144 (H) 04/02/2020 10:58 AM    Wt Readings from Last 3 Encounters:  05/28/20 158 lb 6.4 oz (71.8 kg)  05/26/20 159 lb (72.1 kg)  04/01/20 166 lb (75.3 kg)     Objective:    Vital Signs:  LMP  (LMP Unknown)    VITAL SIGNS:  reviewed GEN:  no acute distress RESPIRATORY:  no shortness of breath in conversation PSYCH:  normal affect  ASSESSMENT & PLAN:    1. Gastroesophageal reflux disease, unspecified whether esophagitis present  - pantoprazole (PROTONIX) 40 MG tablet; Take 1 tablet (40 mg total) by mouth 2 (two) times daily before a meal.  Dispense: 60 tablet; Refill: 5  2. Esophageal dysphagia  Time:   Today, I have spent 11 minutes with the patient with telehealth technology discussing the above problems, reviewing notes, testing and the chart      Medication Adjustments/Labs and Tests Ordered: Current medicines are reviewed at length with the patient today.  Concerns regarding medicines are outlined above.   Tests Ordered: No orders of the defined types were placed in this encounter.   Medication Changes: No orders of the defined types were placed in this encounter.    Disposition:  Follow up 08/07/2020 as scheduled  Signed, Kathy Finner, NP  07/23/2020 1:35 PM     [facility]

## 2020-07-23 NOTE — Assessment & Plan Note (Signed)
I have advised she call GI back and be seen for this again. It could be stress related, but would like them to r/o I have increased her PPI BID until she is seen to see if that is helpful.

## 2020-07-29 DIAGNOSIS — F319 Bipolar disorder, unspecified: Secondary | ICD-10-CM | POA: Diagnosis not present

## 2020-07-31 ENCOUNTER — Ambulatory Visit: Payer: Medicaid Other | Admitting: Family Medicine

## 2020-08-01 ENCOUNTER — Other Ambulatory Visit: Payer: Self-pay | Admitting: Family Medicine

## 2020-08-01 DIAGNOSIS — I10 Essential (primary) hypertension: Secondary | ICD-10-CM

## 2020-08-05 DIAGNOSIS — F319 Bipolar disorder, unspecified: Secondary | ICD-10-CM | POA: Diagnosis not present

## 2020-08-05 NOTE — Progress Notes (Signed)
Cardiology Office Note    Date:  08/12/2020   ID:  Rosalynd, Mcwright [birth year], MRN [id number]  PCP:  Freddy Finner, NP  Cardiologist: Prentice Docker, MD (Inactive) EPS: None  Chief Complaint  Patient presents with  . Follow-up    History of Present Illness:  Kathy Perry is a 60 y.o. female with history of palpitaitons, mild to mod MR on echo 01/2018, holter showed NSR isolated PAC's and 4 beat run WCT.  Last seen in our office 05/2019.  Patient comes in for f/u. Under a lot of stress and being treated for anxiety. When she gets anxious she can feel her heart race and hears her heartbeat in her ear but does deep breathing and it helps. Diagnosed with GERD and protonix helps a lot. Walks her dog a lot 25 min daily. No chest pain, dyspnea, dizziness or presyncope.     Past Medical History:  Diagnosis Date  . Anxiety   . Arthritis   . IBS (irritable bowel syndrome)   . Need for Tdap vaccination 11/13/2019    Past Surgical History:  Procedure Laterality Date  . BREAST LUMPECTOMY WITH RADIOACTIVE SEED LOCALIZATION Left 02/28/2020   Procedure: LEFT BREAST LUMPECTOMY WITH RADIOACTIVE SEED LOCALIZATION;  Surgeon: Manus Rudd, MD;  Location: [facility];  Service: General;  Laterality: Left;  LMA  . TUBAL LIGATION    . WISDOM TOOTH EXTRACTION      Current Medications: Current Meds  Medication Sig  . Aspirin-Acetaminophen-Caffeine (GOODYS EXTRA STRENGTH) 500-325-65 MG PACK Take 1 Package by mouth daily as needed (pain).  . FIBER PO Take by mouth 2 (two) times daily. Chews 2 tablets twice a day  . hydrOXYzine (VISTARIL) 50 MG capsule Take 1 capsule (50 mg total) by mouth 2 (two) times daily as needed for anxiety.  Marland Kitchen lisinopril (ZESTRIL) 10 MG tablet Take 1 tablet (10 mg total) by mouth daily.  . pantoprazole (PROTONIX) 40 MG tablet Take 1 tablet (40 mg total) by mouth 2 (two) times daily before a meal.  . Vitamin D, Ergocalciferol, (DRISDOL)  1.25 MG (50000 UNIT) CAPS capsule Take 1 capsule (50,000 Units total) by mouth every 7 (seven) days.     Allergies:   Codeine   Social History   Socioeconomic History  . Marital status: Married    Spouse name: Virl Diamond  . Number of children: 3  . Years of education: Not on file  . Highest education level: 9th grade  Occupational History  . Occupation: homemaker  Tobacco Use  . Smoking status: Never Smoker  . Smokeless tobacco: Never Used  Vaping Use  . Vaping Use: Never used  Substance and Sexual Activity  . Alcohol use: No  . Drug use: Never  . Sexual activity: Not Currently    Birth control/protection: Surgical, Post-menopausal    Comment: tubal  Other Topics Concern  . Not on file  Social History Narrative   Lives with [facility] 40 years   1 son, 2 daughters   4 grandchildren       Cat: Lucy   Dog: Roofus       Enjoys: walking with dog, flowers, sewing, iron       Diet: eats all food groups-limited meat    Caffeine: some coke    Water: 2-4 cups of water      Wears seat belt   Smoke detectors at home    Social Determinants of Health   Financial Resource Strain: Low  Risk   . Difficulty of Paying Living Expenses: Not hard at all  Food Insecurity: No Food Insecurity  . Worried About Programme researcher, broadcasting/film/video in the Last Year: Never true  . Ran Out of Food in the Last Year: Never true  Transportation Needs: No Transportation Needs  . Lack of Transportation (Medical): No  . Lack of Transportation (Non-Medical): No  Physical Activity: Inactive  . Days of Exercise per Week: 0 days  . Minutes of Exercise per Session: 0 min  Stress: No Stress Concern Present  . Feeling of Stress : Not at all  Social Connections: Moderately Isolated  . Frequency of Communication with Friends and Family: More than three times a week  . Frequency of Social Gatherings with Friends and Family: More than three times a week  . Attends Religious Services: Never  . Active Member of Clubs or  Organizations: No  . Attends Banker Meetings: Never  . Marital Status: Married     Family History:  The patient's family history includes Breast cancer in her paternal aunt and paternal grandmother; COPD in her brother; Cancer in her brother and father; Diabetes in her father; Lung cancer in her paternal aunt; Memory loss in her mother.   ROS:   Please see the history of present illness.    ROS All other systems reviewed and are negative.   PHYSICAL EXAM:   VS:  BP 134/72   Pulse 88   Ht 5' 5.5" (1.664 m)   Wt 151 lb (68.5 kg)   LMP  (LMP Unknown)   SpO2 97%   BMI 24.75 kg/m   Physical Exam  GEN: Well nourished, well developed, in no acute distress  Neck: no JVD, carotid bruits, or masses Cardiac:RRR; 1/6 systolic murmur apex Respiratory:  clear to auscultation bilaterally, normal work of breathing GI: soft, nontender, nondistended, + BS Ext: without cyanosis, clubbing, or edema, Good distal pulses bilaterally Neuro:  Alert and Oriented x 3,  Psych: euthymic mood, full affect  Wt Readings from Last 3 Encounters:  08/12/20 151 lb (68.5 kg)  08/07/20 149 lb (67.6 kg)  05/28/20 158 lb 6.4 oz (71.8 kg)      Studies/Labs Reviewed:   EKG:  EKG is not ordered today.  The ekg reviewed from 07/07/20 NSR, normal EKG Recent Labs: 04/02/2020: ALT 15; Hemoglobin 14.7; Platelets 164; TSH 0.963 04/18/2020: BUN 12; Creatinine, Ser 1.08; Potassium 4.5; Sodium 144   Lipid Panel    Component Value Date/Time   CHOL 230 (H) 04/02/2020 1058   TRIG 156 (H) 04/02/2020 1058   HDL 58 04/02/2020 1058   CHOLHDL 4.0 04/02/2020 1058   LDLCALC 144 (H) 04/02/2020 1058    Additional studies/ records that were reviewed today include:  Echocardiogram 01/18/2018 demonstrated normal left ventricular systolic function and regional wall motion, LVEF 60 to 65%, grade 1 diastolic dysfunction, and mild to moderate mitral regurgitation.   Event monitoring demonstrated sinus rhythm with  isolated PACs and a short 4 beat run of wide-complex tachycardia    Risk Assessment/Calculations:         ASSESSMENT:    1. Palpitations   2. Nonrheumatic mitral valve regurgitation   3. Mixed hyperlipidemia      PLAN:  In order of problems listed above:  Palpitations only when she has anxiety. Has cut out sugar and reduced her caffeine intake.  Mild to mod MR on echo 01/2018 asymptomatic and not a significant murmur  HLD-LDL 144 trig 156, tchol 230  03/2020. Has really changed her diet. Having blood work in March. Has lost 40 lbs in the past year.   Shared Decision Making/Informed Consent        Medication Adjustments/Labs and Tests Ordered: Current medicines are reviewed at length with the patient today.  Concerns regarding medicines are outlined above.  Medication changes, Labs and Tests ordered today are listed in the Patient Instructions below. Patient Instructions  Medication Instructions:  Your physician recommends that you continue on your current medications as directed. Please refer to the Current Medication list given to you today.  *If you need a refill on your cardiac medications before your next appointment, please call your pharmacy*   Lab Work: NONE   If you have labs (blood work) drawn today and your tests are completely normal, you will receive your results only by: Marland Kitchen MyChart Message (if you have MyChart) OR . A paper copy in the mail If you have any lab test that is abnormal or we need to change your treatment, we will call you to review the results.   Testing/Procedures: NONE    Follow-Up: At [facility], you and your health needs are our priority.  As part of our continuing mission to provide you with exceptional heart care, we have created designated Provider Care Teams.  These Care Teams include your primary Cardiologist (physician) and Advanced Practice Providers (APPs -  Physician Assistants and Nurse Practitioners) who all work together  to provide you with the care you need, when you need it.  We recommend signing up for the patient portal called "MyChart".  Sign up information is provided on this After Visit Summary.  MyChart is used to connect with patients for Virtual Visits (Telemedicine).  Patients are able to view lab/test results, encounter notes, upcoming appointments, etc.  Non-urgent messages can be sent to your provider as well.   To learn more about what you can do with MyChart, go to [email or web address].    Your next appointment:   1 year(s)  The format for your next appointment:   In Person  Provider:   Nona Dell, MD or Dina Rich, MD   Other Instructions Thank you for choosing [facility]!       Elson Clan, PA-C  08/12/2020 8:09 AM    [facility] [address] [place], [place], [place]  [address] Phone: [phone]; Fax: [phone]

## 2020-08-07 ENCOUNTER — Other Ambulatory Visit: Payer: Self-pay

## 2020-08-07 ENCOUNTER — Ambulatory Visit: Payer: Medicaid Other | Admitting: Nurse Practitioner

## 2020-08-07 ENCOUNTER — Encounter: Payer: Self-pay | Admitting: Nurse Practitioner

## 2020-08-07 DIAGNOSIS — F419 Anxiety disorder, unspecified: Secondary | ICD-10-CM

## 2020-08-07 NOTE — Patient Instructions (Signed)
For anxiety, you can use hydroxyzine for panic attacks. Additionally, you were referred to psychiatry for medication management related to anxiety.

## 2020-08-07 NOTE — Progress Notes (Signed)
Acute Office Visit  Subjective:    Patient ID: Kathy Perry, female    DOB: [birth year], 60 y.o.   MRN: [id number]  Chief Complaint  Patient presents with  . Follow-up  . Anxiety    HPI Patient is in today for follow-up for anxiety.  She was seen in-office on 07/07/20, and she was started on sertraline.  At that time, her GAD-7 score was 17, and she was seeing therapy.  She was also started on hydroxyzine 50 mg BID PRN for panic attacks.  She is seeing Carollee Leitz at [facility] for therapy.  She states that sewing helps to calm her down.  She didn't take sertraline because she thought it would be too strong.    Past Medical History:  Diagnosis Date  . Anxiety   . Arthritis   . IBS (irritable bowel syndrome)   . Need for Tdap vaccination 11/13/2019    Past Surgical History:  Procedure Laterality Date  . BREAST LUMPECTOMY WITH RADIOACTIVE SEED LOCALIZATION Left 02/28/2020   Procedure: LEFT BREAST LUMPECTOMY WITH RADIOACTIVE SEED LOCALIZATION;  Surgeon: Manus Rudd, MD;  Location: [facility];  Service: General;  Laterality: Left;  LMA  . TUBAL LIGATION    . WISDOM TOOTH EXTRACTION      Family History  Problem Relation Age of Onset  . Diabetes Father   . Cancer Father        not sure primary  . Memory loss Mother   . Cancer Brother        thyroid  . COPD Brother   . Breast cancer Paternal Grandmother   . Breast cancer Paternal Aunt   . Lung cancer Paternal Aunt   . Colon cancer Neg Hx     Social History   Socioeconomic History  . Marital status: Married    Spouse name: Virl Diamond  . Number of children: 3  . Years of education: Not on file  . Highest education level: 9th grade  Occupational History  . Occupation: homemaker  Tobacco Use  . Smoking status: Never Smoker  . Smokeless tobacco: Never Used  Vaping Use  . Vaping Use: Never used  Substance and Sexual Activity  . Alcohol use: No  . Drug use: Never   . Sexual activity: Not Currently    Birth control/protection: Surgical, Post-menopausal    Comment: tubal  Other Topics Concern  . Not on file  Social History Narrative   Lives with [facility] 40 years   1 son, 2 daughters   4 grandchildren       Cat: Lucy   Dog: Roofus       Enjoys: walking with dog, flowers, sewing, iron       Diet: eats all food groups-limited meat    Caffeine: some coke    Water: 2-4 cups of water      Wears seat belt   Smoke detectors at home    Social Determinants of Health   Financial Resource Strain: Low Risk   . Difficulty of Paying Living Expenses: Not hard at all  Food Insecurity: No Food Insecurity  . Worried About Programme researcher, broadcasting/film/video in the Last Year: Never true  . Ran Out of Food in the Last Year: Never true  Transportation Needs: No Transportation Needs  . Lack of Transportation (Medical): No  . Lack of Transportation (Non-Medical): No  Physical Activity: Inactive  . Days of Exercise per Week: 0 days  . Minutes of  Exercise per Session: 0 min  Stress: No Stress Concern Present  . Feeling of Stress : Not at all  Social Connections: Moderately Isolated  . Frequency of Communication with Friends and Family: More than three times a week  . Frequency of Social Gatherings with Friends and Family: More than three times a week  . Attends Religious Services: Never  . Active Member of Clubs or Organizations: No  . Attends Banker Meetings: Never  . Marital Status: Married  Catering manager Violence: Not At Risk  . Fear of Current or Ex-Partner: No  . Emotionally Abused: No  . Physically Abused: No  . Sexually Abused: No    Outpatient Medications Prior to Visit  Medication Sig Dispense Refill  . Aspirin-Acetaminophen-Caffeine (GOODYS EXTRA STRENGTH) 500-325-65 MG PACK Take 1 Package by mouth daily as needed (pain).    . FIBER PO Take by mouth 2 (two) times daily. Chews 2 tablets twice a day    . hydrOXYzine (VISTARIL) 50 MG  capsule Take 1 capsule (50 mg total) by mouth 2 (two) times daily as needed for anxiety. 30 capsule 0  . lisinopril (ZESTRIL) 10 MG tablet Take 1 tablet (10 mg total) by mouth daily. 30 tablet 1  . pantoprazole (PROTONIX) 40 MG tablet Take 1 tablet (40 mg total) by mouth 2 (two) times daily before a meal. 60 tablet 5  . sertraline (ZOLOFT) 50 MG tablet Take 1 tablet (50 mg total) by mouth daily. 30 tablet 3  . Vitamin D, Ergocalciferol, (DRISDOL) 1.25 MG (50000 UNIT) CAPS capsule Take 1 capsule (50,000 Units total) by mouth every 7 (seven) days. 12 capsule 1   No facility-administered medications prior to visit.    Allergies  Allergen Reactions  . Codeine Other (See Comments)    Chest pain    Review of Systems  Constitutional: Negative.   Respiratory: Negative.   Cardiovascular: Negative.   Psychiatric/Behavioral: Negative for self-injury and suicidal ideas. The patient is nervous/anxious.        Objective:    Physical Exam Constitutional:      Appearance: Normal appearance.  Cardiovascular:     Rate and Rhythm: Normal rate and regular rhythm.     Pulses: Normal pulses.     Heart sounds: Normal heart sounds.  Pulmonary:     Effort: Pulmonary effort is normal.     Breath sounds: Normal breath sounds.  Neurological:     Mental Status: She is alert.  Psychiatric:        Behavior: Behavior normal.        Thought Content: Thought content normal.        Judgment: Judgment normal.     Comments: Nervous affect     BP 111/73   Pulse 90   Temp 98.5 F (36.9 C)   Resp 20   Ht 5' 5.5" (1.664 m)   Wt 149 lb (67.6 kg)   LMP  (LMP Unknown)   SpO2 98%   BMI 24.42 kg/m  Wt Readings from Last 3 Encounters:  08/07/20 149 lb (67.6 kg)  05/28/20 158 lb 6.4 oz (71.8 kg)  05/26/20 159 lb (72.1 kg)    Health Maintenance Due  Topic Date Due  . Hepatitis C Screening  Never done  . HIV Screening  Never done    There are no preventive care reminders to display for this  patient.   Lab Results  Component Value Date   TSH 0.963 04/02/2020   Lab Results  Component Value  Date   WBC 4.9 04/02/2020   HGB 14.7 04/02/2020   HCT 44.1 04/02/2020   MCV 87 04/02/2020   PLT 164 04/02/2020   Lab Results  Component Value Date   NA 144 04/18/2020   K 4.5 04/18/2020   CO2 21 04/18/2020   GLUCOSE 94 04/18/2020   BUN 12 04/18/2020   CREATININE 1.08 (H) 04/18/2020   BILITOT 0.4 04/02/2020   ALKPHOS 100 04/02/2020   AST 19 04/02/2020   ALT 15 04/02/2020   PROT 7.4 04/02/2020   ALBUMIN 4.6 04/02/2020   CALCIUM 9.8 04/18/2020   ANIONGAP 13 12/13/2018   Lab Results  Component Value Date   CHOL 230 (H) 04/02/2020   Lab Results  Component Value Date   HDL 58 04/02/2020   Lab Results  Component Value Date   LDLCALC 144 (H) 04/02/2020   Lab Results  Component Value Date   TRIG 156 (H) 04/02/2020   Lab Results  Component Value Date   CHOLHDL 4.0 04/02/2020   Lab Results  Component Value Date   HGBA1C 5.1 04/02/2020       Assessment & Plan:   Problem List Items Addressed This Visit      Other   Anxiety    -GAD-7 = 3 today, was 17 at last appointment -she was prescribed sertraline at last visit, but she did not start this -she was also started on hydroxyzine PRN for panic attacks, but she never took these either -refer to psychiatry -she is seeing a therapist, Wilber Bihari Sturgess at [facility], and that is helping a lot      Relevant Orders   Ambulatory referral to Psychiatry       No orders of the defined types were placed in this encounter.    Heather Roberts, NP

## 2020-08-07 NOTE — Assessment & Plan Note (Signed)
-  GAD-7 = 3 today, was 17 at last appointment -she was prescribed sertraline at last visit, but she did not start this -she was also started on hydroxyzine PRN for panic attacks, but she never took these either -refer to psychiatry -she is seeing a therapist, Carollee Leitz at [facility], and that is helping a lot

## 2020-08-12 ENCOUNTER — Encounter: Payer: Self-pay | Admitting: Physician Assistant

## 2020-08-12 ENCOUNTER — Ambulatory Visit (INDEPENDENT_AMBULATORY_CARE_PROVIDER_SITE_OTHER): Payer: Medicaid Other | Admitting: Physician Assistant

## 2020-08-12 ENCOUNTER — Other Ambulatory Visit: Payer: Self-pay

## 2020-08-12 VITALS — BP 134/72 | HR 88 | Ht 65.5 in | Wt 151.0 lb

## 2020-08-12 DIAGNOSIS — R002 Palpitations: Secondary | ICD-10-CM | POA: Diagnosis not present

## 2020-08-12 DIAGNOSIS — E782 Mixed hyperlipidemia: Secondary | ICD-10-CM | POA: Diagnosis not present

## 2020-08-12 DIAGNOSIS — I340 Nonrheumatic mitral (valve) insufficiency: Secondary | ICD-10-CM | POA: Diagnosis not present

## 2020-08-12 NOTE — Patient Instructions (Signed)
Medication Instructions:  Your physician recommends that you continue on your current medications as directed. Please refer to the Current Medication list given to you today.  *If you need a refill on your cardiac medications before your next appointment, please call your pharmacy*   Lab Work: NONE   If you have labs (blood work) drawn today and your tests are completely normal, you will receive your results only by: Marland Kitchen MyChart Message (if you have MyChart) OR . A paper copy in the mail If you have any lab test that is abnormal or we need to change your treatment, we will call you to review the results.   Testing/Procedures: NONE    Follow-Up: At [facility], you and your health needs are our priority.  As part of our continuing mission to provide you with exceptional heart care, we have created designated Provider Care Teams.  These Care Teams include your primary Cardiologist (physician) and Advanced Practice Providers (APPs -  Physician Assistants and Nurse Practitioners) who all work together to provide you with the care you need, when you need it.  We recommend signing up for the patient portal called "MyChart".  Sign up information is provided on this After Visit Summary.  MyChart is used to connect with patients for Virtual Visits (Telemedicine).  Patients are able to view lab/test results, encounter notes, upcoming appointments, etc.  Non-urgent messages can be sent to your provider as well.   To learn more about what you can do with MyChart, go to [email or web address].    Your next appointment:   1 year(s)  The format for your next appointment:   In Person  Provider:   Nona Dell, MD or Dina Rich, MD   Other Instructions Thank you for choosing [facility]!

## 2020-08-13 DIAGNOSIS — F319 Bipolar disorder, unspecified: Secondary | ICD-10-CM | POA: Diagnosis not present

## 2020-08-20 ENCOUNTER — Encounter: Payer: Self-pay | Admitting: Gastroenterology

## 2020-08-20 ENCOUNTER — Other Ambulatory Visit: Payer: Self-pay

## 2020-08-20 ENCOUNTER — Ambulatory Visit (HOSPITAL_COMMUNITY)
Admission: RE | Admit: 2020-08-20 | Discharge: 2020-08-20 | Disposition: A | Discharge status: Home / Self Care | Payer: Medicaid Other | Source: Ambulatory Visit | Attending: Gastroenterology | Admitting: Gastroenterology

## 2020-08-20 ENCOUNTER — Ambulatory Visit: Payer: Medicaid Other | Admitting: Gastroenterology

## 2020-08-20 VITALS — BP 92/57 | HR 115 | Temp 97.8°F | Ht 66.0 in | Wt 147.8 lb

## 2020-08-20 DIAGNOSIS — R634 Abnormal weight loss: Secondary | ICD-10-CM | POA: Insufficient documentation

## 2020-08-20 DIAGNOSIS — R059 Cough, unspecified: Secondary | ICD-10-CM | POA: Insufficient documentation

## 2020-08-20 DIAGNOSIS — K219 Gastro-esophageal reflux disease without esophagitis: Secondary | ICD-10-CM | POA: Insufficient documentation

## 2020-08-20 NOTE — Progress Notes (Signed)
Primary Care Physician: Freddy Finner, NP  Primary Gastroenterologist:  Hennie Duos. Marletta Lor, DO   Chief Complaint  Patient presents with  . Gastroesophageal Reflux    C/o coughing x 4 months. PCP advised to increase reflux to BID but not helping, chest soreness from cough    HPI: Kathy Perry is a 60 y.o. female here for follow-up.  She was seen back in November.  She has a history of dysphagia, IBS, GERD.  Previously declined upper endoscopy.  Barium esophagram in July 2021 was normal.  Declined colonoscopy last year, completed Cologuard which was negative.  Plans to repeat Cologuard in 2023.    Weight down additional 10 pounds since 05/2020.  Weight 180 pounds November 2020 and down to 147.8 pounds today.  Cough started before Christmas. No heartburn or reguritation. Cough can be triggered with eating or drinking, typically after, not like things going down the wrong way. Can happen anytime as well. Coughs so hard that back hurts.  Cough typically dry.  Feels like there might be some mucus in the back of her throat but she is unable to cough up anything.  May go a day without significant cough and think that she is getting better but then symptoms return. Usually not nocturnal symptoms. Has seen PCP couple of times for this. Increased pantoprazole BID about 3-4 weeks ago. Got choked on a fiber pill once, got stuck in throat. No vomiting. No abdominal. BM more regular on fiber chews.   She is concerned about ongoing weight loss.  Initially she contributed her weight loss earlier last year due to significant dietary changes but states over the past several months she has not made any further changes or reduction in dietary intake and she feels she is eating adequately to maintain her weight.  Drinking a lot water. Cut back on Cokes and sweets. Watching portions. Still eating good dinners, beans/pasta. Does indulge in junk food at times.  Husbands smokes but not near her currently.  Admits to secondhand exposure to smoke throughout the years.    Current Outpatient Medications  Medication Sig Dispense Refill  . Aspirin-Acetaminophen-Caffeine (GOODYS EXTRA STRENGTH) 500-325-65 MG PACK Take 1 Package by mouth daily as needed (pain).    . FIBER PO Take by mouth 2 (two) times daily. Chews 2 tablets twice a day    . hydrOXYzine (VISTARIL) 50 MG capsule Take 1 capsule (50 mg total) by mouth 2 (two) times daily as needed for anxiety. 30 capsule 0  . lisinopril (ZESTRIL) 10 MG tablet Take 1 tablet (10 mg total) by mouth daily. 30 tablet 1  . pantoprazole (PROTONIX) 40 MG tablet Take 1 tablet (40 mg total) by mouth 2 (two) times daily before a meal. 60 tablet 5  . Vitamin D, Ergocalciferol, (DRISDOL) 1.25 MG (50000 UNIT) CAPS capsule Take 1 capsule (50,000 Units total) by mouth every 7 (seven) days. 12 capsule 1   No current facility-administered medications for this visit.    Allergies as of 08/20/2020 - Review Complete 08/20/2020  Allergen Reaction Noted  . Codeine Other (See Comments) 04/05/2015    ROS:  General: Negative for anorexia,  fever, chills, fatigue, weakness. See HPI ENT: Negative for hoarseness, difficulty swallowing , nasal congestion. CV: Negative for chest pain, angina, palpitations, dyspnea on exertion, peripheral edema.  Respiratory: Negative for dyspnea at rest, dyspnea on exertion,  sputum, wheezing. See HPI GI: See history of present illness. GU:  Negative for dysuria, hematuria, urinary incontinence,  urinary frequency, nocturnal urination.  Endo: Negative for unusual weight change. See HPI   Physical Examination:   BP (!) 92/57   Pulse (!) 115   Temp 97.8 F (36.6 C)   Ht 5\' 6"  (1.676 m)   Wt 147 lb 12.8 oz (67 kg)   LMP  (LMP Unknown)   BMI 23.86 kg/m   General: Well-nourished, well-developed in no acute distress.  Eyes: No icterus. Mouth: Oropharyngeal mucosa moist and pink , no lesions erythema or exudate. Lungs: Clear to  auscultation bilaterally.  Heart: Regular rate and rhythm, no murmurs rubs or gallops.  Abdomen: Bowel sounds are normal, nontender, nondistended, no hepatosplenomegaly or masses, no abdominal bruits or hernia , no rebound or guarding.   Extremities: No lower extremity edema. No clubbing or deformities. Neuro: Alert and oriented x 4   Skin: Warm and dry, no jaundice.   Psych: Alert and cooperative, normal mood and affect.  Labs:  Lab Results  Component Value Date   CREATININE 1.08 (H) 04/18/2020   BUN 12 04/18/2020   NA 144 04/18/2020   K 4.5 04/18/2020   CL 104 04/18/2020   CO2 21 04/18/2020   Lab Results  Component Value Date   ALT 15 04/02/2020   AST 19 04/02/2020   ALKPHOS 100 04/02/2020   BILITOT 0.4 04/02/2020   Lab Results  Component Value Date   WBC 4.9 04/02/2020   HGB 14.7 04/02/2020   HCT 44.1 04/02/2020   MCV 87 04/02/2020   PLT 164 04/02/2020   Lab Results  Component Value Date   TSH 0.963 04/02/2020     Imaging Studies: No results found.   Impression/plan:  Pleasant 60 year old female here for further evaluation of 2-month history of cough. She has a history of GERD, dysphagia, IBS. Patient also concerned about weight loss.  Cough present for 2 months. Denies history of upper respiratory illness. No associated shortness of breath, fever, night sweats, chills, chest pain. Has been seen by PCP who increased her PPI to twice daily about 3 to 4 weeks ago. No notable improvement at this time. Her typical reflux symptoms are well controlled. Really denies oropharyngeal dysphagia. Discussed with patient today, if cough is related to her reflux, it can take several months for her to notice a significant improvement in her symptoms with increased PPI dosing. She needs to have a baseline chest x-ray. We will continue pantoprazole twice daily. Further recommendations to follow.  GERD: Typical symptoms well controlled. No longer complains of dysphagia.  IBS: Doing  well on daily fiber.  Weight loss: Patient has lost over 30 pounds in the past year. Initially, weight loss was explained by change in dietary habits. For the past couple months she states she has not been trying to lose any further weight that she is lost an additional 10 pounds. She feels like she is eating adequately. She continues to eat healthy most of the time. Chest x-ray is planned. May need additional work-up if ongoing weight loss. She will keep a check on her weight at home and report any additional weight loss.

## 2020-08-20 NOTE — Patient Instructions (Signed)
1. Continue pantoprazole 40 mg twice daily before a meal. 2. Chest x-ray as scheduled.  We will be in touch with results.   Cough, Adult Coughing is a reflex that clears your throat and your airways (respiratory system). Coughing helps to heal and protect your lungs. It is normal to cough occasionally, but a cough that happens with other symptoms or lasts a long time may be a sign of a condition that needs treatment. An acute cough may only last 2-3 weeks, while a chronic cough may last 8 or more weeks. Coughing is commonly caused by:  Infection of the respiratory systemby viruses or bacteria.  Breathing in substances that irritate your lungs.  Allergies.  Asthma.  Mucus that runs down the back of your throat (postnasal drip).  Smoking.  Acid backing up from the stomach into the esophagus (gastroesophageal reflux).  Certain medicines.  Chronic lung problems.  Other medical conditions such as heart failure or a blood clot in the lung (pulmonary embolism). Follow these instructions at home: Medicines  Take over-the-counter and prescription medicines only as told by your health care provider.  Talk with your health care provider before you take a cough suppressant medicine. Lifestyle  Avoid cigarette smoke. Do not use any products that contain nicotine or tobacco, such as cigarettes, e-cigarettes, and chewing tobacco. If you need help quitting, ask your health care provider.  Drink enough fluid to keep your urine pale yellow.  Avoid caffeine.  Do not drink alcohol if your health care provider tells you not to drink.   General instructions  Pay close attention to changes in your cough. Tell your health care provider about them.  Always cover your mouth when you cough.  Avoid things that make you cough, such as perfume, candles, cleaning products, or campfire or tobacco smoke.  If the air is dry, use a cool mist vaporizer or humidifier in your bedroom or your home to help  loosen secretions.  If your cough is worse at night, try to sleep in a semi-upright position.  Rest as needed.  Keep all follow-up visits as told by your health care provider. This is important.   Contact a health care provider if you:  Have new symptoms.  Cough up pus.  Have a cough that does not get better after 2-3 weeks or gets worse.  Cannot control your cough with cough suppressant medicines and you are losing sleep.  Have pain that gets worse or pain that is not helped with medicine.  Have a fever.  Have unexplained weight loss.  Have night sweats. Get help right away if:  You cough up blood.  You have difficulty breathing.  Your heartbeat is very fast. These symptoms may represent a serious problem that is an emergency. Do not wait to see if the symptoms will go away. Get medical help right away. Call your local emergency services (911 in the [place] Do not drive yourself to the hospital. Summary  Coughing is a reflex that clears your throat and your airways. It is normal to cough occasionally, but a cough that happens with other symptoms or lasts a long time may be a sign of a condition that needs treatment.  Take over-the-counter and prescription medicines only as told by your health care provider.  Always cover your mouth when you cough.  Contact a health care provider if you have new symptoms or a cough that does not get better after 2-3 weeks or gets worse. This information is not intended  to replace advice given to you by your health care provider. Make sure you discuss any questions you have with your health care provider. Document Revised: 07/24/2018 Document Reviewed: 07/24/2018 Elsevier Patient Education  2021 [employer].

## 2020-08-21 DIAGNOSIS — F319 Bipolar disorder, unspecified: Secondary | ICD-10-CM | POA: Diagnosis not present

## 2020-08-29 DIAGNOSIS — F319 Bipolar disorder, unspecified: Secondary | ICD-10-CM | POA: Diagnosis not present

## 2020-09-01 ENCOUNTER — Other Ambulatory Visit: Payer: Self-pay | Admitting: Family Medicine

## 2020-09-01 DIAGNOSIS — I10 Essential (primary) hypertension: Secondary | ICD-10-CM

## 2020-09-03 DIAGNOSIS — F319 Bipolar disorder, unspecified: Secondary | ICD-10-CM | POA: Diagnosis not present

## 2020-09-11 DIAGNOSIS — F319 Bipolar disorder, unspecified: Secondary | ICD-10-CM | POA: Diagnosis not present

## 2020-09-15 ENCOUNTER — Telehealth: Payer: Self-pay

## 2020-09-15 ENCOUNTER — Other Ambulatory Visit: Payer: Self-pay

## 2020-09-15 DIAGNOSIS — F419 Anxiety disorder, unspecified: Secondary | ICD-10-CM

## 2020-09-15 NOTE — Telephone Encounter (Signed)
Patient has one tablet of vitamin d2 left and no refills she is curious if she still needs to be taking this medication and if so can we call refills into [facility] ph# [phone] or [phone]

## 2020-09-15 NOTE — Telephone Encounter (Signed)
Please advise 

## 2020-09-16 NOTE — Telephone Encounter (Signed)
I wanted to recheck her Vit D level in 6 months (which is her next visit)  I would then see if OTC or prescription strength is still needed. She has an appt with Casimiro Needle- please place vitamin D lab for collection that day I think it is 09/22/20.  Thank you in advance

## 2020-09-16 NOTE — Telephone Encounter (Signed)
Pt informed. Lab note added to her appt with Casimiro Needle.

## 2020-09-17 DIAGNOSIS — F319 Bipolar disorder, unspecified: Secondary | ICD-10-CM | POA: Diagnosis not present

## 2020-09-19 ENCOUNTER — Ambulatory Visit: Payer: Medicaid Other | Admitting: Nurse Practitioner

## 2020-09-19 ENCOUNTER — Other Ambulatory Visit: Payer: Self-pay

## 2020-09-19 ENCOUNTER — Encounter: Payer: Self-pay | Admitting: Nurse Practitioner

## 2020-09-19 VITALS — BP 105/72 | HR 92 | Temp 98.7°F | Resp 20 | Ht 65.5 in | Wt 144.0 lb

## 2020-09-19 DIAGNOSIS — E559 Vitamin D deficiency, unspecified: Secondary | ICD-10-CM

## 2020-09-19 DIAGNOSIS — F419 Anxiety disorder, unspecified: Secondary | ICD-10-CM

## 2020-09-19 DIAGNOSIS — R059 Cough, unspecified: Secondary | ICD-10-CM

## 2020-09-19 DIAGNOSIS — M79622 Pain in left upper arm: Secondary | ICD-10-CM

## 2020-09-19 DIAGNOSIS — I10 Essential (primary) hypertension: Secondary | ICD-10-CM | POA: Diagnosis not present

## 2020-09-19 MED ORDER — HYDROXYZINE PAMOATE 25 MG PO CAPS
25.0000 mg | ORAL_CAPSULE | Freq: Three times a day (TID) | ORAL | 2 refills | Status: DC | PRN
Start: 2020-09-19 — End: 2020-12-25

## 2020-09-19 NOTE — Assessment & Plan Note (Signed)
-  had lumpectomy last year for breast mass -due for f/u mammogram; ordered that today

## 2020-09-19 NOTE — Assessment & Plan Note (Signed)
-  will recheck today

## 2020-09-19 NOTE — Patient Instructions (Signed)
Please have fasting labs drawn today.  We will meet back up in 3 months for a lab follow-up. We may mee tup sooner based on lab and/or imaging results.

## 2020-09-19 NOTE — Progress Notes (Signed)
Established Patient Office Visit  Subjective:  Patient ID: Kathy Perry, female    DOB: 01-Apr-1961  Age: 60 y.o. MRN: [id number]  CC:  Chief Complaint  Patient presents with  . Cough    Intermittent cough has returned.     HPI Kathy Perry presents for lab follow-up.  She has vit D deficiency and has hx of breast mass with last mammogram in May 2021, and she has developed a cough. She had lumpectomy in August 2021.  She states she has some left axillary pain.  Her cough has been ongoing for over a month. She states it comes and goes. She recently increased her PPI, and that didn't help. She had CXR on 08/20/20, and that was negative.  She would like to decrease dose of hydroxyzine, because it causes drowsiness.  Past Medical History:  Diagnosis Date  . Anxiety   . Arthritis   . IBS (irritable bowel syndrome)   . Need for Tdap vaccination 11/13/2019    Past Surgical History:  Procedure Laterality Date  . BREAST LUMPECTOMY WITH RADIOACTIVE SEED LOCALIZATION Left 02/28/2020   Procedure: LEFT BREAST LUMPECTOMY WITH RADIOACTIVE SEED LOCALIZATION;  Surgeon: Donnie Mesa, MD;  Location: [facility];  Service: General;  Laterality: Left;  LMA  . TUBAL LIGATION    . WISDOM TOOTH EXTRACTION      Family History  Problem Relation Age of Onset  . Diabetes Father   . Cancer Father        not sure primary  . Memory loss Mother   . Cancer Brother        thyroid  . COPD Brother   . Breast cancer Paternal Grandmother   . Breast cancer Paternal Aunt   . Lung cancer Paternal Aunt   . Colon cancer Neg Hx     Social History   Socioeconomic History  . Marital status: Married    Spouse name: Kathy Perry  . Number of children: 3  . Years of education: Not on file  . Highest education level: 9th grade  Occupational History  . Occupation: homemaker  Tobacco Use  . Smoking status: Never Smoker  . Smokeless tobacco: Never Used  Vaping Use  . Vaping Use: Never  used  Substance and Sexual Activity  . Alcohol use: No  . Drug use: Never  . Sexual activity: Not Currently    Birth control/protection: Surgical, Post-menopausal    Comment: tubal  Other Topics Concern  . Not on file  Social History Narrative   Lives with [facility] 5[facility] 40 years   1 son, 2 daughters   4 grandchildren       Cat: Lucy   Dog: Roofus       Enjoys: walking with dog, flowers, sewing, iron       Diet: eats all food groups-limited meat    Caffeine: some coke    Water: 2-4 cups of water      Wears seat belt   Smoke detectors at home    Social Determinants of Health   Financial Resource Strain: Low Risk   . Difficulty of Paying Living Expenses: Not hard at all  Food Insecurity: No Food Insecurity  . Worried About Charity fundraiser in the Last Year: Never true  . Ran Out of Food in the Last Year: Never true  Transportation Needs: No Transportation Needs  . Lack of Transportation (Medical): No  . Lack of Transportation (Non-Medical): No  Physical Activity: Inactive  .  Days of Exercise per Week: 0 days  . Minutes of Exercise per Session: 0 min  Stress: No Stress Concern Present  . Feeling of Stress : Not at all  Social Connections: Moderately Isolated  . Frequency of Communication with Friends and Family: More than three times a week  . Frequency of Social Gatherings with Friends and Family: More than three times a week  . Attends Religious Services: Never  . Active Member of Clubs or Organizations: No  . Attends Archivist Meetings: Never  . Marital Status: Married  Human resources officer Violence: Not At Risk  . Fear of Current or Ex-Partner: No  . Emotionally Abused: No  . Physically Abused: No  . Sexually Abused: No    Outpatient Medications Prior to Visit  Medication Sig Dispense Refill  . Aspirin-Acetaminophen-Caffeine (GOODYS EXTRA STRENGTH) 500-325-65 MG PACK Take 1 Package by mouth daily as needed (pain).    . FIBER PO Take by mouth 2 (two)  times daily. Chews 2 tablets twice a day    . lisinopril (ZESTRIL) 10 MG tablet TAKE 1 TABLET BY MOUTH ONCE DAILY. 30 tablet 0  . pantoprazole (PROTONIX) 40 MG tablet Take 1 tablet (40 mg total) by mouth 2 (two) times daily before a meal. 60 tablet 5  . Vitamin D, Ergocalciferol, (DRISDOL) 1.25 MG (50000 UNIT) CAPS capsule Take 1 capsule (50,000 Units total) by mouth every 7 (seven) days. 12 capsule 1  . hydrOXYzine (VISTARIL) 50 MG capsule Take 1 capsule (50 mg total) by mouth 2 (two) times daily as needed for anxiety. 30 capsule 0   No facility-administered medications prior to visit.    Allergies  Allergen Reactions  . Codeine Other (See Comments)    Chest pain    ROS Review of Systems  Constitutional: Negative.   HENT: Positive for postnasal drip. Negative for congestion, facial swelling, rhinorrhea, sinus pressure, sinus pain and sore throat.   Respiratory: Positive for cough. Negative for chest tightness, shortness of breath and wheezing.   Cardiovascular: Negative.   Musculoskeletal:       Left axillary pain      Objective:    Physical Exam Constitutional:      Appearance: Normal appearance.  Cardiovascular:     Rate and Rhythm: Normal rate and regular rhythm.     Pulses: Normal pulses.     Heart sounds: Normal heart sounds.  Pulmonary:     Effort: Pulmonary effort is normal.     Breath sounds: Normal breath sounds.  Neurological:     Mental Status: She is alert.  Psychiatric:        Mood and Affect: Mood normal.        Behavior: Behavior normal.        Thought Content: Thought content normal.        Judgment: Judgment normal.     BP 105/72   Pulse 92   Temp 98.7 F (37.1 C)   Resp 20   Ht 5' 5.5" (1.664 m)   Wt 144 lb (65.3 kg)   LMP  (LMP Unknown)   SpO2 97%   BMI 23.60 kg/m  Wt Readings from Last 3 Encounters:  09/19/20 144 lb (65.3 kg)  08/20/20 147 lb 12.8 oz (67 kg)  08/12/20 151 lb (68.5 kg)     Health Maintenance Due  Topic Date Due   . Hepatitis C Screening  Never done  . HIV Screening  Never done    There are no preventive care reminders  to display for this patient.  Lab Results  Component Value Date   TSH 0.963 04/02/2020   Lab Results  Component Value Date   WBC 4.9 04/02/2020   HGB 14.7 04/02/2020   HCT 44.1 04/02/2020   MCV 87 04/02/2020   PLT 164 04/02/2020   Lab Results  Component Value Date   NA 144 04/18/2020   K 4.5 04/18/2020   CO2 21 04/18/2020   GLUCOSE 94 04/18/2020   BUN 12 04/18/2020   CREATININE 1.08 (H) 04/18/2020   BILITOT 0.4 04/02/2020   ALKPHOS 100 04/02/2020   AST 19 04/02/2020   ALT 15 04/02/2020   PROT 7.4 04/02/2020   ALBUMIN 4.6 04/02/2020   CALCIUM 9.8 04/18/2020   ANIONGAP 13 12/13/2018   Lab Results  Component Value Date   CHOL 230 (H) 04/02/2020   Lab Results  Component Value Date   HDL 58 04/02/2020   Lab Results  Component Value Date   LDLCALC 144 (H) 04/02/2020   Lab Results  Component Value Date   TRIG 156 (H) 04/02/2020   Lab Results  Component Value Date   CHOLHDL 4.0 04/02/2020   Lab Results  Component Value Date   HGBA1C 5.1 04/02/2020      Assessment & Plan:   Problem List Items Addressed This Visit      Other   Anxiety   Relevant Medications   hydrOXYzine (VISTARIL) 25 MG capsule   Vitamin D deficiency    -will recheck today      Relevant Orders   VITAMIN D 25 Hydroxy (Vit-D Deficiency, Fractures)   Cough   Left axillary pain    -had lumpectomy last year for breast mass -due for f/u mammogram; ordered that today      Relevant Orders   MM Digital Diagnostic Bilat    Other Visit Diagnoses    Hypertension, essential    -  Primary   Relevant Orders   CBC with Differential/Platelet   CMP14+EGFR   Lipid Panel With LDL/HDL Ratio      Meds ordered this encounter  Medications  . hydrOXYzine (VISTARIL) 25 MG capsule    Sig: Take 1 capsule (25 mg total) by mouth 3 (three) times daily as needed for anxiety.    Dispense:   90 capsule    Refill:  2    Follow-up: Return in about 3 months (around 12/20/2020) for Lab follow-up.    Noreene Larsson, NP

## 2020-09-20 LAB — LIPID PANEL WITH LDL/HDL RATIO
Cholesterol, Total: 244 mg/dL — ABNORMAL HIGH (ref 100–199)
HDL: 54 mg/dL (ref >39)
LDL Chol Calc (NIH): 167 mg/dL — ABNORMAL HIGH (ref 0–99)
LDL/HDL Ratio: 3.1 ratio (ref 0.0–3.2)
Triglycerides: 130 mg/dL (ref 0–149)
VLDL Cholesterol Cal: 23 mg/dL (ref 5–40)

## 2020-09-20 LAB — CBC WITH DIFFERENTIAL/PLATELET
Basophils Absolute: 0.1 10*3/uL (ref 0.0–0.2)
Basos: 2 %
EOS (ABSOLUTE): 0.2 10*3/uL (ref 0.0–0.4)
Eos: 4 %
Hematocrit: 38.9 % (ref 34.0–46.6)
Hemoglobin: 12.7 g/dL (ref 11.1–15.9)
Immature Grans (Abs): 0 10*3/uL (ref 0.0–0.1)
Immature Granulocytes: 0 %
Lymphocytes Absolute: 1.4 10*3/uL (ref 0.7–3.1)
Lymphs: 34 %
MCH: 29 pg (ref 26.6–33.0)
MCHC: 32.6 g/dL (ref 31.5–35.7)
MCV: 89 fL (ref 79–97)
Monocytes Absolute: 0.3 10*3/uL (ref 0.1–0.9)
Monocytes: 8 %
Neutrophils Absolute: 2.2 10*3/uL (ref 1.4–7.0)
Neutrophils: 52 %
Platelets: 151 10*3/uL (ref 150–450)
RBC: 4.38 x10E6/uL (ref 3.77–5.28)
RDW: 12 % (ref 11.7–15.4)
WBC: 4.2 10*3/uL (ref 3.4–10.8)

## 2020-09-20 LAB — CMP14+EGFR
ALT: 11 IU/L (ref 0–32)
AST: 18 IU/L (ref 0–40)
Albumin/Globulin Ratio: 1.9 (ref 1.2–2.2)
Albumin: 4.7 g/dL (ref 3.8–4.9)
Alkaline Phosphatase: 82 IU/L (ref 44–121)
BUN/Creatinine Ratio: 11 (ref 9–23)
BUN: 15 mg/dL (ref 6–24)
Bilirubin Total: 0.3 mg/dL (ref 0.0–1.2)
CO2: 22 mmol/L (ref 20–29)
Calcium: 9.9 mg/dL (ref 8.7–10.2)
Chloride: 99 mmol/L (ref 96–106)
Creatinine, Ser: 1.4 mg/dL — ABNORMAL HIGH (ref 0.57–1.00)
Globulin, Total: 2.5 g/dL (ref 1.5–4.5)
Glucose: 85 mg/dL (ref 65–99)
Potassium: 4.8 mmol/L (ref 3.5–5.2)
Sodium: 139 mmol/L (ref 134–144)
Total Protein: 7.2 g/dL (ref 6.0–8.5)
eGFR: 43 mL/min/{1.73_m2} — ABNORMAL LOW (ref >59)

## 2020-09-20 LAB — VITAMIN D 25 HYDROXY (VIT D DEFICIENCY, FRACTURES): Vit D, 25-Hydroxy: 59.8 ng/mL (ref 30.0–100.0)

## 2020-09-22 ENCOUNTER — Other Ambulatory Visit: Payer: Self-pay | Admitting: Nurse Practitioner

## 2020-09-22 ENCOUNTER — Telehealth: Payer: Self-pay

## 2020-09-22 ENCOUNTER — Other Ambulatory Visit: Payer: Self-pay | Admitting: Family Medicine

## 2020-09-22 ENCOUNTER — Ambulatory Visit: Payer: Medicaid Other | Admitting: Nurse Practitioner

## 2020-09-22 DIAGNOSIS — E559 Vitamin D deficiency, unspecified: Secondary | ICD-10-CM

## 2020-09-22 MED ORDER — ATORVASTATIN CALCIUM 40 MG PO TABS: 40.0000 mg | ORAL_TABLET | Freq: Every day | ORAL | 3 refills | Status: DC

## 2020-09-22 MED ORDER — AMLODIPINE BESYLATE 5 MG PO TABS: 5.0000 mg | ORAL_TABLET | Freq: Every day | ORAL | 3 refills | Status: DC

## 2020-09-22 NOTE — Progress Notes (Signed)
Her kidney function was low, so she should increase her water intake. Also, her cholesterol is more elevated than previous at 167 (last was 144), so I sent in some cholesterol medication.

## 2020-09-22 NOTE — Progress Notes (Signed)
STOP lisinopril d/t cough

## 2020-09-22 NOTE — Telephone Encounter (Signed)
Pt wants to know what to do about her lingering cough since her labs only show cholesterol issues.

## 2020-09-22 NOTE — Telephone Encounter (Signed)
Her cough is persistent, but she had a CXR and that was negative. I sent in amlodipine. She should stop her lisinopril. Sometimes lisinopril can cause a cough. If it hasn't improved in 2 weeks, she should reach out to [place] and we will consider a referral to a specialists either ENT, pulmonology, or both.

## 2020-09-23 ENCOUNTER — Telehealth: Payer: Self-pay

## 2020-09-23 NOTE — Telephone Encounter (Signed)
Pt informed

## 2020-09-23 NOTE — Telephone Encounter (Signed)
Richard is questioning the Vit D, and also wondering why the lisinopril iwas changed to amlodipine  Pleas ecall [phone]

## 2020-09-23 NOTE — Telephone Encounter (Signed)
Pt husband informed

## 2020-09-24 DIAGNOSIS — F319 Bipolar disorder, unspecified: Secondary | ICD-10-CM | POA: Diagnosis not present

## 2020-09-30 ENCOUNTER — Ambulatory Visit: Payer: Medicaid Other | Admitting: Family Medicine

## 2020-10-01 DIAGNOSIS — F319 Bipolar disorder, unspecified: Secondary | ICD-10-CM | POA: Diagnosis not present

## 2020-10-08 DIAGNOSIS — F319 Bipolar disorder, unspecified: Secondary | ICD-10-CM | POA: Diagnosis not present

## 2020-10-16 DIAGNOSIS — F319 Bipolar disorder, unspecified: Secondary | ICD-10-CM | POA: Diagnosis not present

## 2020-10-20 ENCOUNTER — Other Ambulatory Visit: Payer: Self-pay | Admitting: Nurse Practitioner

## 2020-10-20 DIAGNOSIS — M79622 Pain in left upper arm: Secondary | ICD-10-CM

## 2020-10-22 DIAGNOSIS — F321 Major depressive disorder, single episode, moderate: Secondary | ICD-10-CM | POA: Diagnosis not present

## 2020-10-22 DIAGNOSIS — Z5181 Encounter for therapeutic drug level monitoring: Secondary | ICD-10-CM | POA: Diagnosis not present

## 2020-10-22 DIAGNOSIS — F411 Generalized anxiety disorder: Secondary | ICD-10-CM | POA: Diagnosis not present

## 2020-10-30 DIAGNOSIS — F319 Bipolar disorder, unspecified: Secondary | ICD-10-CM | POA: Diagnosis not present

## 2020-11-04 ENCOUNTER — Ambulatory Visit (HOSPITAL_COMMUNITY)
Admission: RE | Admit: 2020-11-04 | Discharge: 2020-11-04 | Disposition: A | Discharge status: Home / Self Care | Payer: Medicaid Other | Source: Ambulatory Visit | Attending: Nurse Practitioner | Admitting: Nurse Practitioner

## 2020-11-04 ENCOUNTER — Other Ambulatory Visit: Payer: Self-pay | Admitting: Nurse Practitioner

## 2020-11-04 ENCOUNTER — Other Ambulatory Visit: Payer: Self-pay

## 2020-11-04 DIAGNOSIS — M79622 Pain in left upper arm: Secondary | ICD-10-CM | POA: Diagnosis not present

## 2020-11-04 DIAGNOSIS — R928 Other abnormal and inconclusive findings on diagnostic imaging of breast: Secondary | ICD-10-CM | POA: Diagnosis not present

## 2020-11-04 DIAGNOSIS — N6489 Other specified disorders of breast: Secondary | ICD-10-CM | POA: Diagnosis not present

## 2020-11-11 ENCOUNTER — Encounter: Payer: Self-pay | Admitting: Internal Medicine

## 2020-11-11 ENCOUNTER — Other Ambulatory Visit: Payer: Self-pay

## 2020-11-11 ENCOUNTER — Ambulatory Visit (HOSPITAL_COMMUNITY)
Admission: RE | Admit: 2020-11-11 | Discharge: 2020-11-11 | Disposition: A | Discharge status: Home / Self Care | Payer: Medicaid Other | Source: Ambulatory Visit | Attending: Internal Medicine | Admitting: Internal Medicine

## 2020-11-11 ENCOUNTER — Ambulatory Visit: Payer: Medicaid Other | Admitting: Internal Medicine

## 2020-11-11 VITALS — BP 123/82 | HR 83 | Temp 98.4°F | Resp 18 | Ht 65.5 in | Wt 139.8 lb

## 2020-11-11 DIAGNOSIS — W19XXXA Unspecified fall, initial encounter: Secondary | ICD-10-CM

## 2020-11-11 DIAGNOSIS — M25562 Pain in left knee: Secondary | ICD-10-CM | POA: Insufficient documentation

## 2020-11-11 DIAGNOSIS — M7989 Other specified soft tissue disorders: Secondary | ICD-10-CM | POA: Diagnosis not present

## 2020-11-11 DIAGNOSIS — M1712 Unilateral primary osteoarthritis, left knee: Secondary | ICD-10-CM | POA: Diagnosis not present

## 2020-11-11 NOTE — Patient Instructions (Signed)
Acute Knee Pain, Adult Many things can cause knee pain. Sometimes, knee pain is sudden (acute) and may be caused by damage, swelling, or irritation of the muscles and tissues that support your knee. The pain often goes away on its own with time and rest. If the pain does not go away, tests may be done to find out what is causing the pain. Follow these instructions at home: If you have a knee sleeve or brace:  Wear the knee sleeve or brace as told by your doctor. Take it off only as told by your doctor.  Loosen it if your toes: ? Tingle. ? Become numb. ? Turn cold and blue.  Keep it clean.  If the knee sleeve or brace is not waterproof: ? Do not let it get wet. ? Cover it with a watertight covering when you take a bath or shower.   Activity  Rest your knee.  Do not do things that cause pain or make pain worse.  Avoid activities where both feet leave the ground at the same time (high-impact activities). Examples are running, jumping rope, and doing jumping jacks.  Work with a physical therapist to make a safe exercise program, as told by your doctor. Managing pain, stiffness, and swelling  If told, put ice on the knee. To do this: ? If you have a removable knee sleeve or brace, take it off as told by your doctor. ? Put ice in a plastic bag. ? Place a towel between your skin and the bag. ? Leave the ice on for 20 minutes, 2-3 times a day. ? Take off the ice if your skin turns bright red. This is very important. If you cannot feel pain, heat, or cold, you have a greater risk of damage to the area.  If told, use an elastic bandage to put pressure (compression) on your injured knee.  Raise your knee above the level of your heart while you are sitting or lying down.  Sleep with a pillow under your knee.   General instructions  Take over-the-counter and prescription medicines only as told by your doctor.  Do not smoke or use any products that contain nicotine or tobacco. If you  need help quitting, ask your doctor.  If you are overweight, work with your doctor and a food expert (dietitian) to set goals to lose weight. Being overweight can make your knee hurt more.  Watch for any changes in your symptoms.  Keep all follow-up visits. Contact a doctor if:  The knee pain does not stop.  The knee pain changes or gets worse.  You have a fever along with knee pain.  Your knee is red or feels warm when you touch it.  Your knee gives out or locks up. Get help right away if:  Your knee swells, and the swelling gets worse.  You cannot move your knee.  You have very bad knee pain that does not get better with pain medicine. Summary  Many things can cause knee pain. The pain often goes away on its own with time and rest.  Your doctor may do tests to find out the cause of the pain.  Watch for any changes in your symptoms. Relieve your pain with rest, medicines, light activity, and use of ice.  Get help right away if you cannot move your knee or your knee pain is very bad. This information is not intended to replace advice given to you by your health care provider. Make sure you discuss   any questions you have with your health care provider. Document Revised: 12/19/2019 Document Reviewed: 12/19/2019 Elsevier Patient Education  2021 Elsevier Inc.  

## 2020-11-11 NOTE — Progress Notes (Signed)
Acute Office Visit  Subjective:    Patient ID: Kathy Perry, female    DOB: [birth year], 60 y.o.   MRN: [id number]  Chief Complaint  Patient presents with  . Knee Pain    Left knee pain has been going on since early fall when she fell on the wet leaves some days are better than others     HPI Patient is in today for evaluation of left knee pain after a fall on her driveway few months ago. She slipped due to wet leaves and hit her left knee to the edge of the pavement. She did not notice severe injury or pain at that time. She gradually started to notice pain over medial side of left knee with mild swelling, which is intermittent, worse with movement. Denies any numbness or weakness of the LE.  Past Medical History:  Diagnosis Date  . Anxiety   . Arthritis   . IBS (irritable bowel syndrome)   . Need for Tdap vaccination 11/13/2019    Past Surgical History:  Procedure Laterality Date  . BREAST LUMPECTOMY WITH RADIOACTIVE SEED LOCALIZATION Left 02/28/2020   Procedure: LEFT BREAST LUMPECTOMY WITH RADIOACTIVE SEED LOCALIZATION;  Surgeon: Donnie Mesa, MD;  Location: [facility];  Service: General;  Laterality: Left;  LMA  . TUBAL LIGATION    . WISDOM TOOTH EXTRACTION      Family History  Problem Relation Age of Onset  . Diabetes Father   . Cancer Father        not sure primary  . Memory loss Mother   . Cancer Brother        thyroid  . COPD Brother   . Breast cancer Paternal Grandmother   . Breast cancer Paternal Aunt   . Lung cancer Paternal Aunt   . Colon cancer Neg Hx     Social History   Socioeconomic History  . Marital status: Married    Spouse name: Harrie Jeans  . Number of children: 3  . Years of education: Not on file  . Highest education level: 9th grade  Occupational History  . Occupation: homemaker  Tobacco Use  . Smoking status: Never Smoker  . Smokeless tobacco: Never Used  Vaping Use  . Vaping Use: Never used  Substance and Sexual  Activity  . Alcohol use: No  . Drug use: Never  . Sexual activity: Not Currently    Birth control/protection: Surgical, Post-menopausal    Comment: tubal  Other Topics Concern  . Not on file  Social History Narrative   Lives with [facility] 40 years   1 son, 2 daughters   4 grandchildren       Cat: Lucy   Dog: Roofus       Enjoys: walking with dog, flowers, sewing, iron       Diet: eats all food groups-limited meat    Caffeine: some coke    Water: 2-4 cups of water      Wears seat belt   Smoke detectors at home    Social Determinants of Health   Financial Resource Strain: Low Risk   . Difficulty of Paying Living Expenses: Not hard at all  Food Insecurity: No Food Insecurity  . Worried About Charity fundraiser in the Last Year: Never true  . Ran Out of Food in the Last Year: Never true  Transportation Needs: No Transportation Needs  . Lack of Transportation (Medical): No  . Lack of Transportation (Non-Medical): No  Physical Activity:  Inactive  . Days of Exercise per Week: 0 days  . Minutes of Exercise per Session: 0 min  Stress: No Stress Concern Present  . Feeling of Stress : Not at all  Social Connections: Moderately Isolated  . Frequency of Communication with Friends and Family: More than three times a week  . Frequency of Social Gatherings with Friends and Family: More than three times a week  . Attends Religious Services: Never  . Active Member of Clubs or Organizations: No  . Attends Archivist Meetings: Never  . Marital Status: Married  Human resources officer Violence: Not At Risk  . Fear of Current or Ex-Partner: No  . Emotionally Abused: No  . Physically Abused: No  . Sexually Abused: No    Outpatient Medications Prior to Visit  Medication Sig Dispense Refill  . amLODipine (NORVASC) 5 MG tablet Take 1 tablet (5 mg total) by mouth daily. 90 tablet 3  . Aspirin-Acetaminophen-Caffeine (GOODYS EXTRA STRENGTH) 500-325-65 MG PACK Take 1 Package by  mouth daily as needed (pain).    Marland Kitchen atorvastatin (LIPITOR) 40 MG tablet Take 1 tablet (40 mg total) by mouth daily. 90 tablet 3  . FIBER PO Take by mouth 2 (two) times daily. Chews 2 tablets twice a day    . hydrOXYzine (VISTARIL) 25 MG capsule Take 1 capsule (25 mg total) by mouth 3 (three) times daily as needed for anxiety. 90 capsule 2  . lisinopril (ZESTRIL) 10 MG tablet TAKE 1 TABLET BY MOUTH ONCE DAILY. 30 tablet 0  . pantoprazole (PROTONIX) 40 MG tablet Take 1 tablet (40 mg total) by mouth 2 (two) times daily before a meal. 60 tablet 5  . Vitamin D, Ergocalciferol, (DRISDOL) 1.25 MG (50000 UNIT) CAPS capsule TAKE 1 CAPSULE BY MOUTH ONCE WEEKLY. 12 capsule 0   No facility-administered medications prior to visit.    Allergies  Allergen Reactions  . Codeine Other (See Comments)    Chest pain    Review of Systems  Constitutional: Negative for chills and fever.  Respiratory: Negative for cough and shortness of breath.   Cardiovascular: Negative for chest pain and palpitations.  Musculoskeletal: Positive for arthralgias (Left knee). Negative for back pain.  Skin: Negative for rash.  Neurological: Negative for dizziness and weakness.       Objective:    Physical Exam Vitals reviewed.  Constitutional:      General: She is not in acute distress.    Appearance: She is not diaphoretic.  HENT:     Head: Normocephalic and atraumatic.     Nose: Nose normal. No congestion.     Mouth/Throat:     Mouth: Mucous membranes are moist.     Pharynx: No posterior oropharyngeal erythema.  Eyes:     General: No scleral icterus.    Extraocular Movements: Extraocular movements intact.     Pupils: Pupils are equal, round, and reactive to light.  Cardiovascular:     Rate and Rhythm: Normal rate and regular rhythm.     Pulses: Normal pulses.     Heart sounds: Normal heart sounds. No murmur heard.   Pulmonary:     Breath sounds: Normal breath sounds. No wheezing or rales.  Abdominal:      Palpations: Abdomen is soft.     Tenderness: There is no abdominal tenderness.  Musculoskeletal:        General: Swelling (Left knee - mild swelling and tenderness, no erythema) present.     Cervical back: Neck supple. No tenderness.  Right lower leg: No edema.     Left lower leg: No edema.  Skin:    General: Skin is warm.     Findings: No rash.  Neurological:     General: No focal deficit present.     Mental Status: She is alert and oriented to person, place, and time.     Sensory: No sensory deficit.     Motor: No weakness.  Psychiatric:        Mood and Affect: Mood normal.        Behavior: Behavior normal.     BP 123/82 (BP Location: Right Arm, Patient Position: Sitting, Cuff Size: Normal)   Pulse 83   Temp 98.4 F (36.9 C) (Oral)   Resp 18   Ht 5' 5.5" (1.664 m)   Wt 139 lb 12.8 oz (63.4 kg)   LMP  (LMP Unknown)   SpO2 99%   BMI 22.91 kg/m  Wt Readings from Last 3 Encounters:  11/11/20 139 lb 12.8 oz (63.4 kg)  09/19/20 144 lb (65.3 kg)  08/20/20 147 lb 12.8 oz (67 kg)    Health Maintenance Due  Topic Date Due  . Hepatitis C Screening  Never done  . HIV Screening  Never done    There are no preventive care reminders to display for this patient.   Lab Results  Component Value Date   TSH 0.963 04/02/2020   Lab Results  Component Value Date   WBC 4.2 09/19/2020   HGB 12.7 09/19/2020   HCT 38.9 09/19/2020   MCV 89 09/19/2020   PLT 151 09/19/2020   Lab Results  Component Value Date   NA 139 09/19/2020   K 4.8 09/19/2020   CO2 22 09/19/2020   GLUCOSE 85 09/19/2020   BUN 15 09/19/2020   CREATININE 1.40 (H) 09/19/2020   BILITOT 0.3 09/19/2020   ALKPHOS 82 09/19/2020   AST 18 09/19/2020   ALT 11 09/19/2020   PROT 7.2 09/19/2020   ALBUMIN 4.7 09/19/2020   CALCIUM 9.9 09/19/2020   ANIONGAP 13 12/13/2018   EGFR 43 (L) 09/19/2020   Lab Results  Component Value Date   CHOL 244 (H) 09/19/2020   Lab Results  Component Value Date   HDL 54  09/19/2020   Lab Results  Component Value Date   LDLCALC 167 (H) 09/19/2020   Lab Results  Component Value Date   TRIG 130 09/19/2020   Lab Results  Component Value Date   CHOLHDL 4.0 04/02/2020   Lab Results  Component Value Date   HGBA1C 5.1 04/02/2020       Assessment & Plan:   Problem List Items Addressed This Visit   None   Visit Diagnoses    Acute pain of left knee    -  Primary Due to mechanical fall Will check X-ray of the knee Conservative measures - knee brace, Tylenol PRN and heating pad If persistent, will need MRI of the knee and/or Orthopedic surgery referral   Relevant Orders   DG Knee Complete 4 Views Left   Fall, initial encounter     Mechanical fall       No orders of the defined types were placed in this encounter.    Lindell Spar, MD

## 2020-11-12 DIAGNOSIS — F411 Generalized anxiety disorder: Secondary | ICD-10-CM | POA: Diagnosis not present

## 2020-11-12 DIAGNOSIS — F321 Major depressive disorder, single episode, moderate: Secondary | ICD-10-CM | POA: Diagnosis not present

## 2020-11-13 DIAGNOSIS — F319 Bipolar disorder, unspecified: Secondary | ICD-10-CM | POA: Diagnosis not present

## 2020-11-20 DIAGNOSIS — F319 Bipolar disorder, unspecified: Secondary | ICD-10-CM | POA: Diagnosis not present

## 2020-11-27 DIAGNOSIS — F319 Bipolar disorder, unspecified: Secondary | ICD-10-CM | POA: Diagnosis not present

## 2020-11-28 DIAGNOSIS — F411 Generalized anxiety disorder: Secondary | ICD-10-CM | POA: Diagnosis not present

## 2020-11-28 DIAGNOSIS — F321 Major depressive disorder, single episode, moderate: Secondary | ICD-10-CM | POA: Diagnosis not present

## 2020-12-04 DIAGNOSIS — F319 Bipolar disorder, unspecified: Secondary | ICD-10-CM | POA: Diagnosis not present

## 2020-12-11 DIAGNOSIS — F319 Bipolar disorder, unspecified: Secondary | ICD-10-CM | POA: Diagnosis not present

## 2020-12-17 ENCOUNTER — Other Ambulatory Visit: Payer: Self-pay

## 2020-12-17 ENCOUNTER — Telehealth: Payer: Self-pay

## 2020-12-17 DIAGNOSIS — Z1231 Encounter for screening mammogram for malignant neoplasm of breast: Secondary | ICD-10-CM

## 2020-12-17 DIAGNOSIS — F319 Bipolar disorder, unspecified: Secondary | ICD-10-CM | POA: Diagnosis not present

## 2020-12-17 NOTE — Telephone Encounter (Signed)
Pt did just want routine screening mammo ordered, since she was advised to f/u annually. Order sent.

## 2020-12-17 NOTE — Telephone Encounter (Signed)
Patient called request ultrasound of her right breast, she has had her left breast done already. Patient call back # [phone].

## 2020-12-17 NOTE — Telephone Encounter (Signed)
error 

## 2020-12-17 NOTE — Telephone Encounter (Signed)
Can you find out what she wants? I see that U/S has been done to left breast. Why does she need right done? The last results from 12/04/19 she had diagnostic mammogram and U/S of both breasts. At that time the right breast had a benign cyst, so they didn't need to follow-up with imaging on the right side.  The results from the imaging that was just completed said that she was good to return to annual mammograms. Is she wanting to be set up for a routine screening mammogram?   CLINICAL DATA:  60 year old female presenting as a recall from screening for bilateral abnormalities.  EXAM: DIGITAL DIAGNOSTIC BILATERAL MAMMOGRAM WITH CAD AND TOMO  ULTRASOUND BILATERAL BREAST  COMPARISON:  Previous exam(s).  ACR Breast Density Category b: There are scattered areas of fibroglandular density.  FINDINGS: Mammogram:  Right breast: Spot compression and full field tomosynthesis views of the right breast were performed. On the additional imaging there is persistence of a round mass in the upper inner right breast measuring approximately 0.5 cm. There are no new findings in the right breast.  Left breast: Spot compression and full field tomosynthesis views of the left breast performed. Spot magnification 2D views were also performed. There are grouped punctate calcifications in the upper slightly inner left breast measuring 4 mm. There appears to be an associated asymmetry. In the retroareolar slightly outer left breast there is an oval obscured mass measuring approximately 0.8 cm.  Mammographic images were processed with CAD.  Ultrasound:  Right breast:  Targeted ultrasound is performed in the right breast at 2 o'clock 6 cm from the nipple demonstrating a round circumscribed anechoic mass measuring 0.5 x 0.4 x 0.5 cm, consistent with a benign simple cyst. No internal blood flow identified. This corresponds to the mammographic finding. Incidentally noted is just superior to this  is another tiny cyst.  Left breast:  Targeted ultrasound performed in the left breast at 3 o'clock retroareolar demonstrates an oval circumscribed hypoechoic mass measuring 0.8 x 0.6 x 0.9 cm, most likely a complicated cyst. No internal blood flow. Incidentally noted at 10 o'clock retroareolar is a benign cluster of cysts measuring 1.3 x 0.6 x 0.9 cm. Ultrasound performed in the left breast at 11 o'clock 3 cm from the nipple demonstrates an oval circumscribed hypoechoic mass measuring 0.5 x 0.6 x 0.3 cm with small internal echogenic foci, possibly representing calcifications.  Targeted ultrasound of the left axilla demonstrates a single abnormal lymph node with cortical thickening measuring 0.4 cm.  IMPRESSION: 1. Right breast mass at 2 o'clock measuring 0.5 cm consistent with a benign simple cyst.  2. Left breast calcifications spanning 4 mm in the upper inner quadrant are indeterminate.  3. Probably benign left breast masses at retroareolar/3 o'clock and 11 o'clock, likely complicated cysts. The mass at 11 o'clock may possibly correspond to the calcifications/asymmetry seen mammographically.  4.  Single abnormal lymph node with mildly thickened cortex.  RECOMMENDATION: 1. Stereotactic core needle biopsy of the left breast calcifications.  2. Ultrasound-guided core needle biopsy of the left axillary lymph node.  3. Six-month ultrasound of the probably benign left breast masses at retroareolar/3 o'clock and 11 o'clock, given that the above recommended biopsies returned benign. If the biopsies return as atypia or malignancy, biopsy would be recommended of these probably benign findings.  BI-RADS CATEGORY  4: Suspicious.   Electronically Signed   By: Emmaline Kluver M.D.   On: 12/04/2019 16:30

## 2020-12-25 ENCOUNTER — Ambulatory Visit: Payer: Medicaid Other | Admitting: Nurse Practitioner

## 2020-12-25 ENCOUNTER — Encounter: Payer: Self-pay | Admitting: Nurse Practitioner

## 2020-12-25 ENCOUNTER — Other Ambulatory Visit: Payer: Self-pay

## 2020-12-25 VITALS — BP 107/68 | HR 88 | Temp 99.0°F | Resp 18 | Ht 65.5 in | Wt 135.0 lb

## 2020-12-25 DIAGNOSIS — F419 Anxiety disorder, unspecified: Secondary | ICD-10-CM

## 2020-12-25 DIAGNOSIS — E785 Hyperlipidemia, unspecified: Secondary | ICD-10-CM

## 2020-12-25 DIAGNOSIS — I10 Essential (primary) hypertension: Secondary | ICD-10-CM | POA: Diagnosis not present

## 2020-12-25 DIAGNOSIS — K219 Gastro-esophageal reflux disease without esophagitis: Secondary | ICD-10-CM | POA: Diagnosis not present

## 2020-12-25 DIAGNOSIS — E7800 Pure hypercholesterolemia, unspecified: Secondary | ICD-10-CM | POA: Diagnosis not present

## 2020-12-25 DIAGNOSIS — F319 Bipolar disorder, unspecified: Secondary | ICD-10-CM | POA: Diagnosis not present

## 2020-12-25 HISTORY — DX: Hyperlipidemia, unspecified: E78.5

## 2020-12-25 MED ORDER — ALUM & MAG HYDROXIDE-SIMETH 200-200-20 MG/5ML PO SUSP: 30.0000 mL | Freq: Four times a day (QID) | ORAL | 0 refills | Status: DC | PRN

## 2020-12-25 NOTE — Assessment & Plan Note (Signed)
-  seeing therapy -doing well with sertraline 25 mg -continue current treatment

## 2020-12-25 NOTE — Assessment & Plan Note (Addendum)
-  she has been using Goody's powders; stop this -continue pantoprazole -Rx. Mylanta, since she states she feels like it is gas -she was referred to GI in the past, will see if mylanta helps PRN along with stopping Goody's

## 2020-12-25 NOTE — Assessment & Plan Note (Signed)
BP Readings from Last 3 Encounters:  12/25/20 107/68  11/11/20 123/82  09/19/20 105/72  -well controlled

## 2020-12-25 NOTE — Progress Notes (Signed)
Established Patient Office Visit  Subjective:  Patient ID: Kathy Perry, female    DOB: 1961/01/09  Age: 60 y.o. MRN: [id number]  CC:  Chief Complaint  Patient presents with   Anxiety   Hypertension    HPI Kathy Perry presents for follow-up for BP and anxiety.  Her BPs have been great recently, but she hasn't been taking her BP at home.  She states her anxiety has been better.  When she feels anxious, she finds a task to complete like sewing. At night, she thinks about what to spend lottery winnings on.  She states she feels like she is doing much better.  We started atorvastatin last OV, and she has not had labs rechecked. She stats she has been taking it as prescribed.  Past Medical History:  Diagnosis Date   Anxiety    Arthritis    IBS (irritable bowel syndrome)    Need for Tdap vaccination 11/13/2019    Past Surgical History:  Procedure Laterality Date   BREAST LUMPECTOMY WITH RADIOACTIVE SEED LOCALIZATION Left 02/28/2020   Procedure: LEFT BREAST LUMPECTOMY WITH RADIOACTIVE SEED LOCALIZATION;  Surgeon: Donnie Mesa, MD;  Location: [facility];  Service: General;  Laterality: Left;  LMA   TUBAL LIGATION     WISDOM TOOTH EXTRACTION      Family History  Problem Relation Age of Onset   Diabetes Father    Cancer Father        not sure primary   Memory loss Mother    Cancer Brother        thyroid   COPD Brother    Breast cancer Paternal Grandmother    Breast cancer Paternal Aunt    Lung cancer Paternal Aunt    Colon cancer Neg Hx     Social History   Socioeconomic History   Marital status: Married    Spouse name: Psychologist, prison and probation services   Number of children: 3   Years of education: Not on file   Highest education level: 9th grade  Occupational History   Occupation: homemaker  Tobacco Use   Smoking status: Never   Smokeless tobacco: Never  Vaping Use   Vaping Use: Never used  Substance and Sexual Activity   Alcohol use: No   Drug use:  Never   Sexual activity: Not Currently    Birth control/protection: Surgical, Post-menopausal    Comment: tubal  Other Topics Concern   Not on file  Social History Narrative   Lives with [place] [address] 40 years   1 son, 2 daughters   4 grandchildren       Cat: Lucy   Dog: Roofus       Enjoys: walking with dog, flowers, sewing, iron       Diet: eats all food groups-limited meat    Caffeine: some coke    Water: 2-4 cups of water      Wears seat belt   Smoke detectors at home    Social Determinants of Radio broadcast assistant Strain: Not on file  Food Insecurity: Not on file  Transportation Needs: Not on file  Physical Activity: Not on file  Stress: Not on file  Social Connections: Not on file  Intimate Partner Violence: Not on file    Outpatient Medications Prior to Visit  Medication Sig Dispense Refill   amLODipine (NORVASC) 5 MG tablet Take 1 tablet (5 mg total) by mouth daily. 90 tablet 3   Aspirin-Acetaminophen-Caffeine (GOODYS EXTRA STRENGTH) 500-325-65 MG PACK  Take 1 Package by mouth daily as needed (pain).     atorvastatin (LIPITOR) 40 MG tablet Take 1 tablet (40 mg total) by mouth daily. 90 tablet 3   FIBER PO Take by mouth 2 (two) times daily. Chews 2 tablets twice a day     lisinopril (ZESTRIL) 10 MG tablet TAKE 1 TABLET BY MOUTH ONCE DAILY. 30 tablet 0   pantoprazole (PROTONIX) 40 MG tablet Take 1 tablet (40 mg total) by mouth 2 (two) times daily before a meal. 60 tablet 5   sertraline (ZOLOFT) 25 MG tablet Take 25 mg by mouth daily.     Vitamin D, Ergocalciferol, (DRISDOL) 1.25 MG (50000 UNIT) CAPS capsule TAKE 1 CAPSULE BY MOUTH ONCE WEEKLY. 12 capsule 0   hydrOXYzine (VISTARIL) 25 MG capsule Take 1 capsule (25 mg total) by mouth 3 (three) times daily as needed for anxiety. 90 capsule 2   No facility-administered medications prior to visit.    Allergies  Allergen Reactions   Codeine Other (See Comments)    Chest pain    ROS Review of Systems   Constitutional: Negative.   Respiratory: Negative.    Cardiovascular: Negative.   Gastrointestinal:  Positive for abdominal pain.       Gas pains  Musculoskeletal:  Positive for arthralgias.  Psychiatric/Behavioral: Negative.       Objective:    Physical Exam Constitutional:      Appearance: Normal appearance.  Cardiovascular:     Rate and Rhythm: Normal rate and regular rhythm.     Pulses: Normal pulses.     Heart sounds: Normal heart sounds.  Pulmonary:     Effort: Pulmonary effort is normal.     Breath sounds: Normal breath sounds.  Musculoskeletal:        General: No swelling, tenderness, deformity or signs of injury. Normal range of motion.  Neurological:     Mental Status: She is alert.  Psychiatric:        Mood and Affect: Mood normal.        Behavior: Behavior normal.        Thought Content: Thought content normal.        Judgment: Judgment normal.     Comments: Anxiety improved from previous    BP 107/68 (BP Location: Right Arm, Patient Position: Sitting, Cuff Size: Large)   Pulse 88   Temp 99 F (37.2 C)   Resp 18   Ht 5' 5.5" (1.664 m)   Wt 135 lb (61.2 kg)   LMP  (LMP Unknown)   SpO2 97%   BMI 22.12 kg/m  Wt Readings from Last 3 Encounters:  12/25/20 135 lb (61.2 kg)  11/11/20 139 lb 12.8 oz (63.4 kg)  09/19/20 144 lb (65.3 kg)     Health Maintenance Due  Topic Date Due   HIV Screening  Never done   Hepatitis C Screening  Never done   Zoster Vaccines- Shingrix (1 of 2) Never done    There are no preventive care reminders to display for this patient.  Lab Results  Component Value Date   TSH 0.963 04/02/2020   Lab Results  Component Value Date   WBC 4.2 09/19/2020   HGB 12.7 09/19/2020   HCT 38.9 09/19/2020   MCV 89 09/19/2020   PLT 151 09/19/2020   Lab Results  Component Value Date   NA 139 09/19/2020   K 4.8 09/19/2020   CO2 22 09/19/2020   GLUCOSE 85 09/19/2020   BUN 15 09/19/2020  CREATININE 1.40 (H) 09/19/2020   BILITOT  0.3 09/19/2020   ALKPHOS 82 09/19/2020   AST 18 09/19/2020   ALT 11 09/19/2020   PROT 7.2 09/19/2020   ALBUMIN 4.7 09/19/2020   CALCIUM 9.9 09/19/2020   ANIONGAP 13 12/13/2018   EGFR 43 (L) 09/19/2020   Lab Results  Component Value Date   CHOL 244 (H) 09/19/2020   Lab Results  Component Value Date   HDL 54 09/19/2020   Lab Results  Component Value Date   LDLCALC 167 (H) 09/19/2020   Lab Results  Component Value Date   TRIG 130 09/19/2020   Lab Results  Component Value Date   CHOLHDL 4.0 04/02/2020   Lab Results  Component Value Date   HGBA1C 5.1 04/02/2020      Assessment & Plan:   Problem List Items Addressed This Visit       Cardiovascular and Mediastinum   Primary hypertension    BP Readings from Last 3 Encounters:  12/25/20 107/68  11/11/20 123/82  09/19/20 105/72  -well controlled         Relevant Orders   CBC with Differential/Platelet   Lipid Panel With LDL/HDL Ratio   CMP14+EGFR     Digestive   GERD (gastroesophageal reflux disease)    -she has been using Goody's powders; stop this -continue pantoprazole -Rx. Mylanta, since she states she feels like it is gas -she was referred to GI in the past, will see if mylanta helps PRN along with stopping Goody's       Relevant Medications   alum & mag hydroxide-simeth (MYLANTA) 200-200-20 MG/5ML suspension     Other   Anxiety    -seeing therapy -doing well with sertraline 25 mg -continue current treatment       Relevant Medications   sertraline (ZOLOFT) 25 MG tablet   Hyperlipidemia - Primary    -ordered labs today -she started atorvastatin at last OV       Relevant Orders   Lipid Panel With LDL/HDL Ratio    Meds ordered this encounter  Medications   alum & mag hydroxide-simeth (MYLANTA) 200-200-20 MG/5ML suspension    Sig: Take 30 mLs by mouth every 6 (six) hours as needed for indigestion or heartburn.    Dispense:  355 mL    Refill:  0     Follow-up: Return in about 3  months (around 03/27/2021) for Lab follow-up (HLD).    Noreene Larsson, NP

## 2020-12-25 NOTE — Patient Instructions (Signed)
Please have fasting labs drawn tomorrow. 

## 2020-12-25 NOTE — Assessment & Plan Note (Signed)
-  ordered labs today -she started atorvastatin at last OV

## 2020-12-26 ENCOUNTER — Other Ambulatory Visit: Payer: Self-pay | Admitting: *Deleted

## 2020-12-26 ENCOUNTER — Telehealth: Payer: Self-pay

## 2020-12-26 DIAGNOSIS — I10 Essential (primary) hypertension: Secondary | ICD-10-CM | POA: Diagnosis not present

## 2020-12-26 DIAGNOSIS — E7800 Pure hypercholesterolemia, unspecified: Secondary | ICD-10-CM | POA: Diagnosis not present

## 2020-12-26 MED ORDER — ALUM & MAG HYDROXIDE-SIMETH 200-200-20 MG/5ML PO SUSP: 30.0000 mL | Freq: Four times a day (QID) | ORAL | 0 refills | Status: DC | PRN

## 2020-12-26 NOTE — Telephone Encounter (Signed)
Patient spouse said [facility] has not received this medication yet at the pharmacy.  alum & mag hydroxide-simeth (MYLANTA) 200-200-20 MG/5ML suspension

## 2020-12-26 NOTE — Telephone Encounter (Signed)
Pt medication resent to [facility]

## 2020-12-27 LAB — LIPID PANEL WITH LDL/HDL RATIO
Cholesterol, Total: 148 mg/dL (ref 100–199)
HDL: 60 mg/dL (ref >39)
LDL Chol Calc (NIH): 70 mg/dL (ref 0–99)
LDL/HDL Ratio: 1.2 ratio (ref 0.0–3.2)
Triglycerides: 98 mg/dL (ref 0–149)
VLDL Cholesterol Cal: 18 mg/dL (ref 5–40)

## 2020-12-27 LAB — CMP14+EGFR
ALT: 18 IU/L (ref 0–32)
AST: 16 IU/L (ref 0–40)
Albumin/Globulin Ratio: 1.7 (ref 1.2–2.2)
Albumin: 4.6 g/dL (ref 3.8–4.9)
Alkaline Phosphatase: 102 IU/L (ref 44–121)
BUN/Creatinine Ratio: 13 (ref 9–23)
BUN: 16 mg/dL (ref 6–24)
Bilirubin Total: 0.4 mg/dL (ref 0.0–1.2)
CO2: 24 mmol/L (ref 20–29)
Calcium: 10 mg/dL (ref 8.7–10.2)
Chloride: 104 mmol/L (ref 96–106)
Creatinine, Ser: 1.24 mg/dL — ABNORMAL HIGH (ref 0.57–1.00)
Globulin, Total: 2.7 g/dL (ref 1.5–4.5)
Glucose: 92 mg/dL (ref 65–99)
Potassium: 4.7 mmol/L (ref 3.5–5.2)
Sodium: 143 mmol/L (ref 134–144)
Total Protein: 7.3 g/dL (ref 6.0–8.5)
eGFR: 50 mL/min/{1.73_m2} — ABNORMAL LOW (ref >59)

## 2020-12-27 LAB — CBC WITH DIFFERENTIAL/PLATELET
Basophils Absolute: 0.1 10*3/uL (ref 0.0–0.2)
Basos: 2 %
EOS (ABSOLUTE): 0.5 10*3/uL — ABNORMAL HIGH (ref 0.0–0.4)
Eos: 10 %
Hematocrit: 39.8 % (ref 34.0–46.6)
Hemoglobin: 13.2 g/dL (ref 11.1–15.9)
Immature Grans (Abs): 0 10*3/uL (ref 0.0–0.1)
Immature Granulocytes: 0 %
Lymphocytes Absolute: 1.8 10*3/uL (ref 0.7–3.1)
Lymphs: 32 %
MCH: 29.2 pg (ref 26.6–33.0)
MCHC: 33.2 g/dL (ref 31.5–35.7)
MCV: 88 fL (ref 79–97)
Monocytes Absolute: 0.3 10*3/uL (ref 0.1–0.9)
Monocytes: 6 %
Neutrophils Absolute: 2.8 10*3/uL (ref 1.4–7.0)
Neutrophils: 50 %
Platelets: 179 10*3/uL (ref 150–450)
RBC: 4.52 x10E6/uL (ref 3.77–5.28)
RDW: 12.3 % (ref 11.7–15.4)
WBC: 5.5 10*3/uL (ref 3.4–10.8)

## 2020-12-29 DIAGNOSIS — F411 Generalized anxiety disorder: Secondary | ICD-10-CM | POA: Diagnosis not present

## 2020-12-29 DIAGNOSIS — F321 Major depressive disorder, single episode, moderate: Secondary | ICD-10-CM | POA: Diagnosis not present

## 2020-12-29 NOTE — Progress Notes (Signed)
Labs are stable. No need to add ro change medications.

## 2020-12-31 DIAGNOSIS — F319 Bipolar disorder, unspecified: Secondary | ICD-10-CM | POA: Diagnosis not present

## 2021-01-07 ENCOUNTER — Encounter (HOSPITAL_COMMUNITY): Payer: Self-pay

## 2021-01-07 ENCOUNTER — Ambulatory Visit (HOSPITAL_COMMUNITY)
Admission: RE | Admit: 2021-01-07 | Discharge: 2021-01-07 | Disposition: A | Discharge status: Home / Self Care | Payer: Medicaid Other | Source: Ambulatory Visit | Attending: Nurse Practitioner | Admitting: Nurse Practitioner

## 2021-01-07 ENCOUNTER — Other Ambulatory Visit: Payer: Self-pay

## 2021-01-07 DIAGNOSIS — F319 Bipolar disorder, unspecified: Secondary | ICD-10-CM | POA: Diagnosis not present

## 2021-01-07 DIAGNOSIS — Z1231 Encounter for screening mammogram for malignant neoplasm of breast: Secondary | ICD-10-CM | POA: Insufficient documentation

## 2021-01-08 NOTE — Progress Notes (Signed)
Mammogram was negative, so repeat in 1 year.

## 2021-01-15 DIAGNOSIS — F319 Bipolar disorder, unspecified: Secondary | ICD-10-CM | POA: Diagnosis not present

## 2021-01-22 ENCOUNTER — Telehealth: Payer: Self-pay

## 2021-01-22 ENCOUNTER — Other Ambulatory Visit: Payer: Self-pay

## 2021-01-22 DIAGNOSIS — K219 Gastro-esophageal reflux disease without esophagitis: Secondary | ICD-10-CM

## 2021-01-22 MED ORDER — PANTOPRAZOLE SODIUM 40 MG PO TBEC: 40.0000 mg | DELAYED_RELEASE_TABLET | Freq: Two times a day (BID) | ORAL | 5 refills | Status: DC

## 2021-01-22 NOTE — Telephone Encounter (Signed)
Patient need refill  pantoprazole (PROTONIX) 40 MG tablet   Pharmacy: [facility]

## 2021-01-22 NOTE — Telephone Encounter (Signed)
Rx sent 

## 2021-01-28 DIAGNOSIS — F319 Bipolar disorder, unspecified: Secondary | ICD-10-CM | POA: Diagnosis not present

## 2021-02-11 DIAGNOSIS — F319 Bipolar disorder, unspecified: Secondary | ICD-10-CM | POA: Diagnosis not present

## 2021-02-25 DIAGNOSIS — F319 Bipolar disorder, unspecified: Secondary | ICD-10-CM | POA: Diagnosis not present

## 2021-03-16 DIAGNOSIS — F321 Major depressive disorder, single episode, moderate: Secondary | ICD-10-CM | POA: Diagnosis not present

## 2021-03-16 DIAGNOSIS — F411 Generalized anxiety disorder: Secondary | ICD-10-CM | POA: Diagnosis not present

## 2021-03-26 ENCOUNTER — Other Ambulatory Visit: Payer: Self-pay

## 2021-03-26 ENCOUNTER — Ambulatory Visit: Payer: Medicaid Other | Admitting: Family Medicine

## 2021-03-26 ENCOUNTER — Ambulatory Visit: Payer: Medicaid Other | Admitting: Nurse Practitioner

## 2021-03-26 ENCOUNTER — Encounter: Payer: Self-pay | Admitting: Nurse Practitioner

## 2021-03-26 DIAGNOSIS — I10 Essential (primary) hypertension: Secondary | ICD-10-CM | POA: Diagnosis not present

## 2021-03-26 DIAGNOSIS — R0982 Postnasal drip: Secondary | ICD-10-CM | POA: Insufficient documentation

## 2021-03-26 DIAGNOSIS — K219 Gastro-esophageal reflux disease without esophagitis: Secondary | ICD-10-CM

## 2021-03-26 DIAGNOSIS — F419 Anxiety disorder, unspecified: Secondary | ICD-10-CM | POA: Diagnosis not present

## 2021-03-26 HISTORY — DX: Postnasal drip: R09.82

## 2021-03-26 MED ORDER — FLUTICASONE PROPIONATE 50 MCG/ACT NA SUSP: NASAL | 6 refills | Status: DC

## 2021-03-26 NOTE — Assessment & Plan Note (Signed)
BP Readings from Last 3 Encounters:  03/26/21 (!) 108/55  12/25/20 107/68  11/11/20 123/82   -continue current meds; asymptomatic

## 2021-03-26 NOTE — Patient Instructions (Addendum)
Please have fasting labs drawn in about a week.  We will meet up in a few months to review labs. Please have fasting labs drawn 2-3 days prior to your appointment so we can discuss the results during your office visit.
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
## 2021-03-26 NOTE — Assessment & Plan Note (Signed)
-  occasional; gets sinus pressure as well -rx. flonase

## 2021-03-26 NOTE — Assessment & Plan Note (Signed)
-  improved from previous -she cut back on Goody's powders, and that has helped -continue protonix and PRN mylanta

## 2021-03-30 DIAGNOSIS — F319 Bipolar disorder, unspecified: Secondary | ICD-10-CM | POA: Diagnosis not present

## 2021-04-06 DIAGNOSIS — I10 Essential (primary) hypertension: Secondary | ICD-10-CM | POA: Diagnosis not present

## 2021-04-06 DIAGNOSIS — F419 Anxiety disorder, unspecified: Secondary | ICD-10-CM | POA: Diagnosis not present

## 2021-04-07 LAB — LIPID PANEL WITH LDL/HDL RATIO
Cholesterol, Total: 141 mg/dL (ref 100–199)
HDL: 67 mg/dL (ref >39)
LDL Chol Calc (NIH): 60 mg/dL (ref 0–99)
LDL/HDL Ratio: 0.9 ratio (ref 0.0–3.2)
Triglycerides: 70 mg/dL (ref 0–149)
VLDL Cholesterol Cal: 14 mg/dL (ref 5–40)

## 2021-04-07 LAB — CMP14+EGFR
ALT: 17 IU/L (ref 0–32)
AST: 21 IU/L (ref 0–40)
Albumin/Globulin Ratio: 2.4 — ABNORMAL HIGH (ref 1.2–2.2)
Albumin: 4.7 g/dL (ref 3.8–4.9)
Alkaline Phosphatase: 87 IU/L (ref 44–121)
BUN/Creatinine Ratio: 13 (ref 9–23)
BUN: 16 mg/dL (ref 6–24)
Bilirubin Total: 0.3 mg/dL (ref 0.0–1.2)
CO2: 26 mmol/L (ref 20–29)
Calcium: 9.6 mg/dL (ref 8.7–10.2)
Chloride: 101 mmol/L (ref 96–106)
Creatinine, Ser: 1.2 mg/dL — ABNORMAL HIGH (ref 0.57–1.00)
Globulin, Total: 2 g/dL (ref 1.5–4.5)
Glucose: 85 mg/dL (ref 65–99)
Potassium: 4.1 mmol/L (ref 3.5–5.2)
Sodium: 141 mmol/L (ref 134–144)
Total Protein: 6.7 g/dL (ref 6.0–8.5)
eGFR: 52 mL/min/{1.73_m2} — ABNORMAL LOW (ref >59)

## 2021-04-07 LAB — CBC WITH DIFFERENTIAL/PLATELET
Basophils Absolute: 0.1 10*3/uL (ref 0.0–0.2)
Basos: 2 %
EOS (ABSOLUTE): 0.2 10*3/uL (ref 0.0–0.4)
Eos: 4 %
Hematocrit: 41.3 % (ref 34.0–46.6)
Hemoglobin: 13.6 g/dL (ref 11.1–15.9)
Immature Grans (Abs): 0 10*3/uL (ref 0.0–0.1)
Immature Granulocytes: 0 %
Lymphocytes Absolute: 1.7 10*3/uL (ref 0.7–3.1)
Lymphs: 33 %
MCH: 28.6 pg (ref 26.6–33.0)
MCHC: 32.9 g/dL (ref 31.5–35.7)
MCV: 87 fL (ref 79–97)
Monocytes Absolute: 0.4 10*3/uL (ref 0.1–0.9)
Monocytes: 7 %
Neutrophils Absolute: 2.9 10*3/uL (ref 1.4–7.0)
Neutrophils: 54 %
Platelets: 159 10*3/uL (ref 150–450)
RBC: 4.75 x10E6/uL (ref 3.77–5.28)
RDW: 11.7 % (ref 11.7–15.4)
WBC: 5.3 10*3/uL (ref 3.4–10.8)

## 2021-04-07 LAB — TSH: TSH: 1.48 u[IU]/mL (ref 0.450–4.500)

## 2021-04-07 NOTE — Progress Notes (Signed)
Kidney function is stable, and the rest of the labs are great.

## 2021-04-09 DIAGNOSIS — F319 Bipolar disorder, unspecified: Secondary | ICD-10-CM | POA: Diagnosis not present

## 2021-04-13 ENCOUNTER — Telehealth: Payer: Self-pay | Admitting: Nurse Practitioner

## 2021-04-13 NOTE — Telephone Encounter (Signed)
yes

## 2021-04-13 NOTE — Telephone Encounter (Signed)
Pt is calling wants a nurse to call her regarding feeling sick,  she states it comes and goes--please call

## 2021-04-13 NOTE — Telephone Encounter (Signed)
Appt made

## 2021-04-15 ENCOUNTER — Telehealth: Payer: Medicaid Other | Admitting: Nurse Practitioner

## 2021-04-15 ENCOUNTER — Encounter: Payer: Self-pay | Admitting: Nurse Practitioner

## 2021-04-15 DIAGNOSIS — J0100 Acute maxillary sinusitis, unspecified: Secondary | ICD-10-CM

## 2021-04-15 DIAGNOSIS — J019 Acute sinusitis, unspecified: Secondary | ICD-10-CM | POA: Insufficient documentation

## 2021-04-15 MED ORDER — AZITHROMYCIN 250 MG PO TABS: ORAL_TABLET | ORAL | 0 refills | Status: DC

## 2021-04-15 NOTE — Progress Notes (Signed)
Acute Office Visit  Subjective:    Patient ID: Kathy Perry, female    DOB: 02/13/1961, 60 y.o.   MRN: [id number]  Chief Complaint  Patient presents with   Cough    Throat pain/dry congestion jaw pain    Cough Associated symptoms include chills and a sore throat. Pertinent negatives include no fever, shortness of breath or wheezing.  Patient is in today for sick visit. Symptoms started about 5 days ago. She has jaw/dental pain and sinus pressure. She has been using flonase, but that didn't help.  Past Medical History:  Diagnosis Date   Anxiety    Arthritis    IBS (irritable bowel syndrome)    Need for Tdap vaccination 11/13/2019    Past Surgical History:  Procedure Laterality Date   BREAST EXCISIONAL BIOPSY     BREAST LUMPECTOMY WITH RADIOACTIVE SEED LOCALIZATION Left 02/28/2020   Procedure: LEFT BREAST LUMPECTOMY WITH RADIOACTIVE SEED LOCALIZATION;  Surgeon: Donnie Mesa, MD;  Location: [facility];  Service: General;  Laterality: Left;  LMA   TUBAL LIGATION     WISDOM TOOTH EXTRACTION      Family History  Problem Relation Age of Onset   Diabetes Father    Cancer Father        not sure primary   Memory loss Mother    Cancer Brother        thyroid   COPD Brother    Breast cancer Paternal Grandmother    Breast cancer Paternal Aunt    Lung cancer Paternal Aunt    Colon cancer Neg Hx     Social History   Socioeconomic History   Marital status: Married    Spouse name: Psychologist, prison and probation services   Number of children: 3   Years of education: Not on file   Highest education level: 9th grade  Occupational History   Occupation: homemaker  Tobacco Use   Smoking status: Never   Smokeless tobacco: Never  Vaping Use   Vaping Use: Never used  Substance and Sexual Activity   Alcohol use: No   Drug use: Never   Sexual activity: Not Currently    Birth control/protection: Surgical, Post-menopausal    Comment: tubal  Other Topics Concern   Not on file  Social  History Narrative   Lives with [place] 40 years   1 son, 2 daughters   4 grandchildren       Cat: Lucy   Dog: Roofus       Enjoys: walking with dog, flowers, sewing, iron       Diet: eats all food groups-limited meat    Caffeine: some coke    Water: 2-4 cups of water      Wears seat belt   Smoke detectors at home    Social Determinants of Radio broadcast assistant Strain: Not on file  Food Insecurity: Not on file  Transportation Needs: Not on file  Physical Activity: Not on file  Stress: Not on file  Social Connections: Not on file  Intimate Partner Violence: Not on file    Outpatient Medications Prior to Visit  Medication Sig Dispense Refill   alum & mag hydroxide-simeth (MYLANTA) 200-200-20 MG/5ML suspension Take 30 mLs by mouth every 6 (six) hours as needed for indigestion or heartburn. 355 mL 0   amLODipine (NORVASC) 5 MG tablet Take 1 tablet (5 mg total) by mouth daily. 90 tablet 3   Aspirin-Acetaminophen-Caffeine (GOODYS EXTRA STRENGTH) 500-325-65 MG PACK Take 1 Package by  mouth daily as needed (pain).     atorvastatin (LIPITOR) 40 MG tablet Take 1 tablet (40 mg total) by mouth daily. 90 tablet 3   FIBER PO Take by mouth 2 (two) times daily. Chews 2 tablets twice a day     fluticasone (FLONASE) 50 MCG/ACT nasal spray Use 2 sprays in each nostril BID for a week. After 1 week, decrease to 1 spray in each nostril BID as needed for congestion/allergies. 16 g 6   lisinopril (ZESTRIL) 10 MG tablet TAKE 1 TABLET BY MOUTH ONCE DAILY. 30 tablet 0   pantoprazole (PROTONIX) 40 MG tablet Take 1 tablet (40 mg total) by mouth 2 (two) times daily before a meal. 60 tablet 5   sertraline (ZOLOFT) 25 MG tablet Take 25 mg by mouth daily.     Vitamin D, Ergocalciferol, (DRISDOL) 1.25 MG (50000 UNIT) CAPS capsule TAKE 1 CAPSULE BY MOUTH ONCE WEEKLY. 12 capsule 0   No facility-administered medications prior to visit.    Allergies  Allergen Reactions   Codeine Other (See  Comments)    Chest pain    Review of Systems  Constitutional:  Positive for chills. Negative for fever.  HENT:  Positive for congestion, sinus pressure, sinus pain and sore throat.   Respiratory:  Positive for cough. Negative for shortness of breath and wheezing.       Objective:    Physical Exam  LMP  (LMP Unknown)  Wt Readings from Last 3 Encounters:  03/26/21 138 lb (62.6 kg)  12/25/20 135 lb (61.2 kg)  11/11/20 139 lb 12.8 oz (63.4 kg)    Health Maintenance Due  Topic Date Due   HIV Screening  Never done   Hepatitis C Screening  Never done    There are no preventive care reminders to display for this patient.   Lab Results  Component Value Date   TSH 1.480 04/06/2021   Lab Results  Component Value Date   WBC 5.3 04/06/2021   HGB 13.6 04/06/2021   HCT 41.3 04/06/2021   MCV 87 04/06/2021   PLT 159 04/06/2021   Lab Results  Component Value Date   NA 141 04/06/2021   K 4.1 04/06/2021   CO2 26 04/06/2021   GLUCOSE 85 04/06/2021   BUN 16 04/06/2021   CREATININE 1.20 (H) 04/06/2021   BILITOT 0.3 04/06/2021   ALKPHOS 87 04/06/2021   AST 21 04/06/2021   ALT 17 04/06/2021   PROT 6.7 04/06/2021   ALBUMIN 4.7 04/06/2021   CALCIUM 9.6 04/06/2021   ANIONGAP 13 12/13/2018   EGFR 52 (L) 04/06/2021   Lab Results  Component Value Date   CHOL 141 04/06/2021   Lab Results  Component Value Date   HDL 67 04/06/2021   Lab Results  Component Value Date   LDLCALC 60 04/06/2021   Lab Results  Component Value Date   TRIG 70 04/06/2021   Lab Results  Component Value Date   CHOLHDL 4.0 04/02/2020   Lab Results  Component Value Date   HGBA1C 5.1 04/02/2020       Assessment & Plan:   Problem List Items Addressed This Visit       Respiratory   Acute sinus infection    -sinus infection x 5 days -no covid test because symptoms have been ongoing so long the paxlovid would be ineffective -Rx. z-pack      Relevant Medications   azithromycin  (ZITHROMAX) 250 MG tablet     Meds ordered this encounter  Medications  azithromycin (ZITHROMAX) 250 MG tablet    Sig: Please dispense as a z-pack    Dispense:  6 tablet    Refill:  0    Date:  04/15/2021   Location of Patient: Home Location of Provider: Office Consent was obtain for visit to be over via telehealth. I verified that I am speaking with the correct person using two identifiers.  I connected with  Diandra Cimini Mallo on 04/15/21 via telephone and verified that I am speaking with the correct person using two identifiers.   I discussed the limitations of evaluation and management by telemedicine. The patient expressed understanding and agreed to proceed.  Time spent: 5 min     Noreene Larsson, NP

## 2021-04-15 NOTE — Assessment & Plan Note (Signed)
-  sinus infection x 5 days -no covid test because symptoms have been ongoing so long the paxlovid would be ineffective -Rx. z-pack

## 2021-04-17 ENCOUNTER — Telehealth: Payer: Self-pay

## 2021-04-17 ENCOUNTER — Other Ambulatory Visit: Payer: Self-pay | Admitting: Nurse Practitioner

## 2021-04-17 MED ORDER — PROMETHAZINE-DM 6.25-15 MG/5ML PO SYRP
5.0000 mL | ORAL_SOLUTION | Freq: Four times a day (QID) | ORAL | 0 refills | Status: DC | PRN
Start: 2021-04-17 — End: 2021-08-10

## 2021-04-17 NOTE — Telephone Encounter (Signed)
Patient spouse called and today patient is coughing kept her up all night, can Kathy Perry send in something for her cough. Was just seen phone visit 09.28.2022. call back # [phone].  Pharmacy: [facility]

## 2021-04-17 NOTE — Telephone Encounter (Signed)
Sent cough syrup to apothecary.

## 2021-04-17 NOTE — Telephone Encounter (Signed)
Patient aware.

## 2021-04-23 DIAGNOSIS — F319 Bipolar disorder, unspecified: Secondary | ICD-10-CM | POA: Diagnosis not present

## 2021-05-07 DIAGNOSIS — F319 Bipolar disorder, unspecified: Secondary | ICD-10-CM | POA: Diagnosis not present

## 2021-05-18 DIAGNOSIS — F411 Generalized anxiety disorder: Secondary | ICD-10-CM | POA: Diagnosis not present

## 2021-05-18 DIAGNOSIS — F321 Major depressive disorder, single episode, moderate: Secondary | ICD-10-CM | POA: Diagnosis not present

## 2021-05-20 ENCOUNTER — Ambulatory Visit: Payer: Medicaid Other | Admitting: Family Medicine

## 2021-05-20 ENCOUNTER — Ambulatory Visit (HOSPITAL_COMMUNITY)
Admission: RE | Admit: 2021-05-20 | Discharge: 2021-05-20 | Disposition: A | Discharge status: Home / Self Care | Payer: Medicaid Other | Source: Ambulatory Visit | Attending: Family Medicine | Admitting: Family Medicine

## 2021-05-20 ENCOUNTER — Encounter: Payer: Self-pay | Admitting: Family Medicine

## 2021-05-20 ENCOUNTER — Other Ambulatory Visit: Payer: Self-pay

## 2021-05-20 VITALS — BP 119/74 | HR 75 | Resp 17 | Ht 65.0 in | Wt 138.0 lb

## 2021-05-20 DIAGNOSIS — R059 Cough, unspecified: Secondary | ICD-10-CM | POA: Diagnosis not present

## 2021-05-20 DIAGNOSIS — R634 Abnormal weight loss: Secondary | ICD-10-CM | POA: Diagnosis not present

## 2021-05-20 DIAGNOSIS — R1319 Other dysphagia: Secondary | ICD-10-CM

## 2021-05-20 DIAGNOSIS — M545 Low back pain, unspecified: Secondary | ICD-10-CM | POA: Diagnosis not present

## 2021-05-20 DIAGNOSIS — R053 Chronic cough: Secondary | ICD-10-CM | POA: Insufficient documentation

## 2021-05-20 DIAGNOSIS — G8929 Other chronic pain: Secondary | ICD-10-CM

## 2021-05-20 MED ORDER — BENZONATATE 100 MG PO CAPS: 100.0000 mg | ORAL_CAPSULE | Freq: Two times a day (BID) | ORAL | 0 refills | Status: DC | PRN

## 2021-05-20 MED ORDER — PREDNISONE 5 MG PO TABS: 5.0000 mg | ORAL_TABLET | Freq: Two times a day (BID) | ORAL | 0 refills | Status: AC

## 2021-05-20 NOTE — Assessment & Plan Note (Signed)
Refer to GI 

## 2021-05-20 NOTE — Progress Notes (Signed)
   Kathy Perry     MRN: [id number]      DOB: [birth year]   HPI Kathy Perry is here c/o bilateral breast and axillary pain and discomfort, no mass or  nipple d/c, also back pain which sh associates with pulling her dog   2 week c/o cough, getting better but persists, , a lot of 2nd second hand smoke exposure C/o difficulty swallowing solid food also poor appetite with weight loss ROS Denies recent fever or chills. Denies sinus pressure, nasal congestion, ear pain Denies chest pains, palpitations and leg swelling Denies abdominal pain, nausea, vomiting,diarrhea or constipation.   Denies dysuria, frequency, hesitancy or incontinence. Denies headaches, seizures, numbness, or tingling. Denies depression, anxiety or insomnia. Denies skin break down or rash.   PE  BP 119/74   Pulse 75   Resp 17   Ht 5\' 5"  (1.651 m)   Wt 138 lb 0.6 oz (62.6 kg)   LMP  (LMP Unknown)   SpO2 97%   BMI 22.97 kg/m   Patient alert and oriented and in no cardiopulmonary distress.  HEENT: No facial asymmetry, EOMI,     Neck supple .  Chest: decreased air entry, no crackles or wheezes.  CVS: S1, S2 no murmurs, no S3.Regular rate.  ABD: Soft non tender.   Ext: No edema  MS: Adequate ROM spine, shoulders, hips and knees.  Skin: Intact, no ulcerations or rash noted.  Psych: Good eye contact, normal affect. Memory intact not anxious or depressed appearing.  CNS: CN 2-12 intact, power,  normal throughout.no focal deficits noted.   Assessment & Plan  Dysphagia Reports solid dysphagia/ food sticking 1 week ago and at times difficulty swallowing liquid, refer to gI  Weight loss, unintentional Refer to GI  Cough Chronic cough with 2nd hand exposure, cXR, short course of prednisone and tessalon perle  Loss of weight Weight loss with dysphagia, refer to GI

## 2021-05-20 NOTE — Assessment & Plan Note (Addendum)
Reports solid dysphagia/ food sticking 1 week ago and at times difficulty swallowing liquid, refer to gI

## 2021-05-20 NOTE — Patient Instructions (Signed)
F/U as before, call if you need me sooner  cXR today , new is tessalon perle and prednisone short term for cough. You are referred to gI re weight loss and dysphagia  Pull /  your dog in a waythat you do not strain your back please  Thanks for choosing  Primary Care, we consider it a privelige to serve you.

## 2021-05-21 DIAGNOSIS — F319 Bipolar disorder, unspecified: Secondary | ICD-10-CM | POA: Diagnosis not present

## 2021-05-24 ENCOUNTER — Encounter: Payer: Self-pay | Admitting: Family Medicine

## 2021-05-24 DIAGNOSIS — M549 Dorsalgia, unspecified: Secondary | ICD-10-CM | POA: Insufficient documentation

## 2021-05-24 NOTE — Assessment & Plan Note (Signed)
Weight loss with dysphagia, refer to GI

## 2021-05-24 NOTE — Assessment & Plan Note (Signed)
Chronic cough with 2nd hand exposure, cXR, short course of prednisone and tessalon perle

## 2021-05-24 NOTE — Assessment & Plan Note (Signed)
Triggered by walking dog which pulls,  Advised more care with physical activity

## 2021-05-26 ENCOUNTER — Encounter: Payer: Self-pay | Admitting: Internal Medicine

## 2021-06-01 NOTE — H&P (View-Only) (Signed)
  Referring Provider: Gray, Joseph M, NP Primary Care Physician:  Gray, Joseph M, NP Primary GI Physician: Dr. Carver  Chief Complaint  Patient presents with   Dysphagia    Comes and goes. Sore at times in chest, like a cramp. Occ cough     HPI:   Kathy Perry is a 60 y.o. female presenting today at the request of Dr. Simpson for unintentional weight loss and dysphagia.  She has GI history significant for IBS with alternating constipation and diarrhea and GERD.  Previously reported dysphagia and declined EGD.  Barium esophagram in 2021 was normal.  She has also declined colonoscopy and completed a Cologuard in 2020 which was negative, plans to repeat in 2023.  Last seen in our office February 2022.  Reported cough and unintentional weight loss.  Coughing started before Christmas.  Occurred after meals or anytime.  Increase PPI to twice daily 3 to 4 weeks prior to office visit with no improvement.  Typical GERD symptoms well controlled.  She was concerned about ongoing weight loss.  Initially she contributed her weight loss earlier last year due to significant dietary changes, but reports over the last several months, she has not made any further changes or reduction in dietary intake and felt she was eating adequately to maintain her weight.  IBS symptoms were doing well on daily fiber.  She was scheduled for chest x-ray and advised to continue Protonix 40 mg twice daily.   Chest x-ray with no acute findings.  Recommended discussing cough with PCP if cough continues despite pantoprazole twice daily.  Today:   Weight loss: Weight has been stable since April. Doesn't eat breakfast. Eats lunch and dinner. Occasional snacking. Watches what she eats. Limits sugar and fried foods. Feels bad if she eats fatty foods or candy. Rare meat. Hamburger about once or twice a month.  Does not eat poultry or fish.  Walks her dog about an hour a day. More careful about dietary intake now than she used  to be. Doesn't want to get "big again". Doesn't want to weigh more than 140 lbs.   Dysphagia: Feels foods go down slow or are getting hung in mid chest. Started back a couple weeks ago. Has occurred in the past and seemed to go away on its own. No regurgitation. No trouble with liquids. Some horseness intermittently. Intermittent cough. Not sure if it is allergies. Typical reflux symptoms are well controlled on Protonix 40 mg twice daily. No nausea or vomiting. No abdominal pain. No blood in the stools or black stools.   No change in bowel habits. IBS symptom are well controlled with fiber daily. Bowels moving most every day.   Goody powders: 1-2 a day for joint pain. Hip and knees.   Past Medical History:  Diagnosis Date   Anxiety    Arthritis    GERD (gastroesophageal reflux disease)    IBS (irritable bowel syndrome)    Need for Tdap vaccination 11/13/2019    Past Surgical History:  Procedure Laterality Date   BREAST EXCISIONAL BIOPSY     BREAST LUMPECTOMY WITH RADIOACTIVE SEED LOCALIZATION Left 02/28/2020   Procedure: LEFT BREAST LUMPECTOMY WITH RADIOACTIVE SEED LOCALIZATION;  Surgeon: Tsuei, Matthew, MD;  Location: [facility];  Service: General;  Laterality: Left;  LMA   TUBAL LIGATION     WISDOM TOOTH EXTRACTION      Current Outpatient Medications  Medication Sig Dispense Refill   amLODipine (NORVASC) 5 MG tablet Take 1 tablet (  5 mg total) by mouth daily. 90 tablet 3   Aspirin-Acetaminophen-Caffeine (GOODYS EXTRA STRENGTH) 500-325-65 MG PACK Take 1 Package by mouth daily as needed (pain).     atorvastatin (LIPITOR) 40 MG tablet Take 1 tablet (40 mg total) by mouth daily. 90 tablet 3   benzonatate (TESSALON) 100 MG capsule Take 1 capsule (100 mg total) by mouth 2 (two) times daily as needed for cough. 20 capsule 0   FIBER PO Take by mouth 2 (two) times daily. Chews 2 tablets twice a day     lisinopril (ZESTRIL) 10 MG tablet TAKE 1 TABLET BY MOUTH ONCE DAILY. 30  tablet 0   pantoprazole (PROTONIX) 40 MG tablet Take 1 tablet (40 mg total) by mouth 2 (two) times daily before a meal. 60 tablet 5   promethazine-dextromethorphan (PROMETHAZINE-DM) 6.25-15 MG/5ML syrup Take 5 mLs by mouth 4 (four) times daily as needed for cough. 118 mL 0   sertraline (ZOLOFT) 25 MG tablet Take 25 mg by mouth daily.     VITAMIN D, ERGOCALCIFEROL, PO Take by mouth daily.     CLENPIQ 10-3.5-12 MG-GM -GM/160ML SOLN Take 1 kit by mouth once for 1 dose. 320 mL 0   Vitamin D, Ergocalciferol, (DRISDOL) 1.25 MG (50000 UNIT) CAPS capsule TAKE 1 CAPSULE BY MOUTH ONCE WEEKLY. (Patient not taking: Reported on 06/03/2021) 12 capsule 0   No current facility-administered medications for this visit.    Allergies as of 06/03/2021 - Review Complete 06/03/2021  Allergen Reaction Noted   Codeine Other (See Comments) 04/05/2015    Family History  Problem Relation Age of Onset   Memory loss Mother    Diabetes Father    Cancer Father        not sure primary   Cancer Brother        thyroid   COPD Brother    Breast cancer Paternal Grandmother    Breast cancer Paternal Aunt    Lung cancer Paternal Aunt    Colon cancer Neg Hx    Esophageal cancer Neg Hx    Gastric cancer Neg Hx     Social History   Socioeconomic History   Marital status: Married    Spouse name: Chuck   Number of children: 3   Years of education: Not on file   Highest education level: 9th grade  Occupational History   Occupation: homemaker  Tobacco Use   Smoking status: Never   Smokeless tobacco: Never  Vaping Use   Vaping Use: Never used  Substance and Sexual Activity   Alcohol use: No   Drug use: Never   Sexual activity: Not Currently    Birth control/protection: Surgical, Post-menopausal    Comment: tubal  Other Topics Concern   Not on file  Social History Narrative   Lives with Chuck Dec 5th 40 years   1 son, 2 daughters   4 grandchildren       Cat: Lucy   Dog: Roofus       Enjoys: walking  with dog, flowers, sewing, iron       Diet: eats all food groups-limited meat    Caffeine: some coke    Water: 2-4 cups of water      Wears seat belt   Smoke detectors at home    Social Determinants of Health   Financial Resource Strain: Not on file  Food Insecurity: Not on file  Transportation Needs: Not on file  Physical Activity: Not on file  Stress: Not on file  Social   Connections: Not on file    Review of Systems: Gen: Denies fever, chills, cold or flulike symptoms, presyncope, syncope. CV: Denies chest pain, palpitations. Resp: Denies dyspnea or cough. GI: See HPI Heme: See HPI  Physical Exam: BP 120/76   Pulse 76   Temp (!) 97.1 F (36.2 C) (Temporal)   Ht 5' 5.5" (1.664 m)   Wt 138 lb 12.8 oz (63 kg)   LMP  (LMP Unknown)   BMI 22.75 kg/m  General:   Alert and oriented. No distress noted. Pleasant and cooperative.  Head:  Normocephalic and atraumatic. Eyes:  Conjuctiva clear without scleral icterus. Heart:  S1, S2 present without murmurs appreciated. Lungs:  Clear to auscultation bilaterally. No wheezes, rales, or rhonchi. No distress.  Abdomen:  +BS, soft, non-tender and non-distended. No rebound or guarding. No HSM or masses noted. Msk:  Symmetrical without gross deformities. Normal posture. Extremities:  Without edema. Neurologic:  Alert and  oriented x4 Psych: Normal mood and affect.    Assessment: 60-year-old female with history of anxiety, GERD, IBS, presenting today for further evaluation of dysphagia and weight loss at the request of Dr. Simpson.  Dysphagia:  History of dysphagia with normal barium esophagram in 2021.  Patient reports her symptoms seem to spontaneously resolve, but have now returned.  Describes foods going down her esophagus slowly and occasionally getting stuck in her mid chest.  No regurgitation.  Chronic GERD is well controlled on Protonix 40 mg twice daily.  She does report intermittent hoarseness and cough, possibly secondary to  allergies. Also weight loss discussed below.  Currently taking 1-2 Goody powders daily for hip and knee pain and Excedrin Migraine as needed. Differentials include esophageal web, ring, stricture, EOE, malignancy.  She needs EGD with possible dilation for further evaluation and therapeutic intervention.  Weight loss: Patient has been losing weight since 2020.  She weighed 184 lbs in August 2020.  She began to make significant dietary changes and increased her activity levels at that time to intentionally lose weight.  Lost down to 168 lbs in August 2021, down to 139 lbs in April 2022, and currently weighing 138 lbs. Aside from dysphagia, she has no significant upper or lower GI symptoms.  No family history of colon or other known GI cancers.  Overall, suspect her weight loss is secondary to intentional caloric restriction and poor protein intake.  Patient states she is very cautious about her diet intake, feels bad when she eats something fatty or when eating candy, and rarely eats meat.  States she does not want to get above 140 pounds again.  We will proceed with EGD and colonoscopy to evaluate her dysphagia and for colon cancer screening.  I encouraged her to continue to eat at least 2-3 meals per day and add protein shakes.  Encouraged her to try to maintain her current weight.  Colon cancer screening:  Needs first-ever screening colonoscopy.  Denies change in bowel habits, BRBPR, melena.  She has had some weight loss as per above, though weight seems stable over the last 7 months.  No family history of colon cancer.  IBS with alternating constipation and diarrhea: Well controlled with fiber daily.   Plan:  Proceed with EGD with possible esophageal dilation and colonoscopy with propofol with Dr. Carver in the near future. The risks, benefits, and alternatives have been discussed with the patient in detail. The patient states understanding and desires to proceed. ASA 2 Continue Protonix 40 mg twice  daily. Advised to limit   Goody powders and Excedrin migraine is much as possible.  Recommended using Tylenol first. Eat at least 2-3 meals per day. Try increasing dietary protein from nonmeat sources. Add protein shakes 1-2 times daily. Continue fiber daily. Follow-up after procedures.     Aliene Altes, PA-C [facility] 06/03/2021

## 2021-06-01 NOTE — Progress Notes (Signed)
Referring Provider: Noreene Larsson, NP Primary Care Physician:  Kathy Larsson, NP Primary GI Physician: Dr. Abbey Perry  Chief Complaint  Patient presents with   Dysphagia    Comes and goes. Sore at times in chest, like a cramp. Occ cough     HPI:   Kathy Perry is a 60 y.o. female presenting today at the request of Dr. Moshe Perry for unintentional weight loss and dysphagia.  She has GI history significant for IBS with alternating constipation and diarrhea and GERD.  Previously reported dysphagia and declined EGD.  Barium esophagram in 2021 was normal.  She has also declined colonoscopy and completed a Cologuard in 2020 which was negative, plans to repeat in 2023.  Last seen in our office February 2022.  Reported cough and unintentional weight loss.  Coughing started before Christmas.  Occurred after meals or anytime.  Increase PPI to twice daily 3 to 4 weeks prior to office visit with no improvement.  Typical GERD symptoms well controlled.  She was concerned about ongoing weight loss.  Initially she contributed her weight loss earlier last year due to significant dietary changes, but reports over the last several months, she has not made any further changes or reduction in dietary intake and felt she was eating adequately to maintain her weight.  IBS symptoms were doing well on daily fiber.  She was scheduled for chest x-ray and advised to continue Protonix 40 mg twice daily.   Chest x-ray with no acute findings.  Recommended discussing cough with PCP if cough continues despite pantoprazole twice daily.  Today:   Weight loss: Weight has been stable since April. Doesn't eat breakfast. Eats lunch and dinner. Occasional snacking. Watches what she eats. Limits sugar and fried foods. Feels bad if she eats fatty foods or candy. Rare meat. Hamburger about once or twice a month.  Does not eat poultry or fish.  Walks her dog about an hour a day. More careful about dietary intake now than she used  to be. Doesn't want to get "big again". Doesn't want to weigh more than 140 lbs.   Dysphagia: Feels foods go down slow or are getting hung in mid chest. Started back a couple weeks ago. Has occurred in the past and seemed to go away on its own. No regurgitation. No trouble with liquids. Some horseness intermittently. Intermittent cough. Not sure if it is allergies. Typical reflux symptoms are well controlled on Protonix 40 mg twice daily. No nausea or vomiting. No abdominal pain. No blood in the stools or black stools.   No change in bowel habits. IBS symptom are well controlled with fiber daily. Bowels moving most every day.   Goody powders: 1-2 a day for joint pain. Hip and knees.   Past Medical History:  Diagnosis Date   Anxiety    Arthritis    GERD (gastroesophageal reflux disease)    IBS (irritable bowel syndrome)    Need for Tdap vaccination 11/13/2019    Past Surgical History:  Procedure Laterality Date   BREAST EXCISIONAL BIOPSY     BREAST LUMPECTOMY WITH RADIOACTIVE SEED LOCALIZATION Left 02/28/2020   Procedure: LEFT BREAST LUMPECTOMY WITH RADIOACTIVE SEED LOCALIZATION;  Surgeon: Donnie Mesa, MD;  Location: [facility];  Service: General;  Laterality: Left;  LMA   TUBAL LIGATION     WISDOM TOOTH EXTRACTION      Current Outpatient Medications  Medication Sig Dispense Refill   amLODipine (NORVASC) 5 MG tablet Take 1 tablet (  5 mg total) by mouth daily. 90 tablet 3   Aspirin-Acetaminophen-Caffeine (GOODYS EXTRA STRENGTH) 500-325-65 MG PACK Take 1 Package by mouth daily as needed (pain).     atorvastatin (LIPITOR) 40 MG tablet Take 1 tablet (40 mg total) by mouth daily. 90 tablet 3   benzonatate (TESSALON) 100 MG capsule Take 1 capsule (100 mg total) by mouth 2 (two) times daily as needed for cough. 20 capsule 0   FIBER PO Take by mouth 2 (two) times daily. Chews 2 tablets twice a day     lisinopril (ZESTRIL) 10 MG tablet TAKE 1 TABLET BY MOUTH ONCE DAILY. 30  tablet 0   pantoprazole (PROTONIX) 40 MG tablet Take 1 tablet (40 mg total) by mouth 2 (two) times daily before a meal. 60 tablet 5   promethazine-dextromethorphan (PROMETHAZINE-DM) 6.25-15 MG/5ML syrup Take 5 mLs by mouth 4 (four) times daily as needed for cough. 118 mL 0   sertraline (ZOLOFT) 25 MG tablet Take 25 mg by mouth daily.     VITAMIN D, ERGOCALCIFEROL, PO Take by mouth daily.     CLENPIQ 10-3.5-12 MG-GM -GM/160ML SOLN Take 1 kit by mouth once for 1 dose. 320 mL 0   Vitamin D, Ergocalciferol, (DRISDOL) 1.25 MG (50000 UNIT) CAPS capsule TAKE 1 CAPSULE BY MOUTH ONCE WEEKLY. (Patient not taking: Reported on 06/03/2021) 12 capsule 0   No current facility-administered medications for this visit.    Allergies as of 06/03/2021 - Review Complete 06/03/2021  Allergen Reaction Noted   Codeine Other (See Comments) 04/05/2015    Family History  Problem Relation Age of Onset   Memory loss Mother    Diabetes Father    Cancer Father        not sure primary   Cancer Brother        thyroid   COPD Brother    Breast cancer Paternal Grandmother    Breast cancer Paternal Aunt    Lung cancer Paternal Aunt    Colon cancer Neg Hx    Esophageal cancer Neg Hx    Gastric cancer Neg Hx     Social History   Socioeconomic History   Marital status: Married    Spouse name: Psychologist, prison and probation services   Number of children: 3   Years of education: Not on file   Highest education level: 9th grade  Occupational History   Occupation: homemaker  Tobacco Use   Smoking status: Never   Smokeless tobacco: Never  Vaping Use   Vaping Use: Never used  Substance and Sexual Activity   Alcohol use: No   Drug use: Never   Sexual activity: Not Currently    Birth control/protection: Surgical, Post-menopausal    Comment: tubal  Other Topics Concern   Not on file  Social History Narrative   Lives with Kathy Perry 5th 40 years   1 son, 2 daughters   4 grandchildren       Cat: Lucy   Dog: Roofus       Enjoys: walking  with dog, flowers, sewing, iron       Diet: eats all food groups-limited meat    Caffeine: some coke    Water: 2-4 cups of water      Wears seat belt   Smoke detectors at home    Social Determinants of Health   Financial Resource Strain: Not on file  Food Insecurity: Not on file  Transportation Needs: Not on file  Physical Activity: Not on file  Stress: Not on file  Social  Connections: Not on file    Review of Systems: Gen: Denies fever, chills, cold or flulike symptoms, presyncope, syncope. CV: Denies chest pain, palpitations. Resp: Denies dyspnea or cough. GI: See HPI Heme: See HPI  Physical Exam: BP 120/76   Pulse 76   Temp (!) 97.1 F (36.2 C) (Temporal)   Ht 5' 5.5" (1.664 m)   Wt 138 lb 12.8 oz (63 kg)   LMP  (LMP Unknown)   BMI 22.75 kg/m  General:   Alert and oriented. No distress noted. Pleasant and cooperative.  Head:  Normocephalic and atraumatic. Eyes:  Conjuctiva clear without scleral icterus. Heart:  S1, S2 present without murmurs appreciated. Lungs:  Clear to auscultation bilaterally. No wheezes, rales, or rhonchi. No distress.  Abdomen:  +BS, soft, non-tender and non-distended. No rebound or guarding. No HSM or masses noted. Msk:  Symmetrical without gross deformities. Normal posture. Extremities:  Without edema. Neurologic:  Alert and  oriented x4 Psych: Normal mood and affect.    Assessment: 60 year old female with history of anxiety, GERD, IBS, presenting today for further evaluation of dysphagia and weight loss at the request of Dr. Moshe Perry.  Dysphagia:  History of dysphagia with normal barium esophagram in 2021.  Patient reports her symptoms seem to spontaneously resolve, but have now returned.  Describes foods going down her esophagus slowly and occasionally getting stuck in her mid chest.  No regurgitation.  Chronic GERD is well controlled on Protonix 40 mg twice daily.  She does report intermittent hoarseness and cough, possibly secondary to  allergies. Also weight loss discussed below.  Currently taking 1-2 Goody powders daily for hip and knee pain and Excedrin Migraine as needed. Differentials include esophageal web, ring, stricture, EOE, malignancy.  She needs EGD with possible dilation for further evaluation and therapeutic intervention.  Weight loss: Patient has been losing weight since 2020.  She weighed 184 lbs in August 2020.  She began to make significant dietary changes and increased her activity levels at that time to intentionally lose weight.  Lost down to 168 lbs in August 2021, down to 139 lbs in April 2022, and currently weighing 138 lbs. Aside from dysphagia, she has no significant upper or lower GI symptoms.  No family history of colon or other known GI cancers.  Overall, suspect her weight loss is secondary to intentional caloric restriction and poor protein intake.  Patient states she is very cautious about her diet intake, feels bad when she eats something fatty or when eating candy, and rarely eats meat.  States she does not want to get above 140 pounds again.  We will proceed with EGD and colonoscopy to evaluate her dysphagia and for colon cancer screening.  I encouraged her to continue to eat at least 2-3 meals per day and add protein shakes.  Encouraged her to try to maintain her current weight.  Colon cancer screening:  Needs first-ever screening colonoscopy.  Denies change in bowel habits, BRBPR, melena.  She has had some weight loss as per above, though weight seems stable over the last 7 months.  No family history of colon cancer.  IBS with alternating constipation and diarrhea: Well controlled with fiber daily.   Plan:  Proceed with EGD with possible esophageal dilation and colonoscopy with propofol with Dr. Abbey Perry in the near future. The risks, benefits, and alternatives have been discussed with the patient in detail. The patient states understanding and desires to proceed. ASA 2 Continue Protonix 40 mg twice  daily. Advised to limit  Goody powders and Excedrin migraine is much as possible.  Recommended using Tylenol first. Eat at least 2-3 meals per day. Try increasing dietary protein from nonmeat sources. Add protein shakes 1-2 times daily. Continue fiber daily. Follow-up after procedures.     Aliene Altes, PA-C [facility] 06/03/2021

## 2021-06-03 ENCOUNTER — Encounter: Payer: Self-pay | Admitting: *Deleted

## 2021-06-03 ENCOUNTER — Other Ambulatory Visit: Payer: Self-pay

## 2021-06-03 ENCOUNTER — Ambulatory Visit: Payer: Medicaid Other | Admitting: Gastroenterology

## 2021-06-03 ENCOUNTER — Encounter: Payer: Self-pay | Admitting: Gastroenterology

## 2021-06-03 VITALS — BP 120/76 | HR 76 | Temp 97.1°F | Ht 65.5 in | Wt 138.8 lb

## 2021-06-03 DIAGNOSIS — R1319 Other dysphagia: Secondary | ICD-10-CM

## 2021-06-03 DIAGNOSIS — K219 Gastro-esophageal reflux disease without esophagitis: Secondary | ICD-10-CM

## 2021-06-03 DIAGNOSIS — R634 Abnormal weight loss: Secondary | ICD-10-CM

## 2021-06-03 DIAGNOSIS — Z1211 Encounter for screening for malignant neoplasm of colon: Secondary | ICD-10-CM | POA: Diagnosis not present

## 2021-06-03 DIAGNOSIS — K582 Mixed irritable bowel syndrome: Secondary | ICD-10-CM

## 2021-06-03 MED ORDER — CLENPIQ 10-3.5-12 MG-GM -GM/160ML PO SOLN: 1.0000 | Freq: Once | ORAL | 0 refills | Status: AC

## 2021-06-03 NOTE — Patient Instructions (Signed)
We will arrange for you to have a colonoscopy and upper endoscopy with possible stretching of your esophagus in the near future with Dr. Marletta Lor.  Continue taking Protonix 40 mg twice daily 30 minutes before breakfast and dinner for acid reflux.  Please limit Goody powders and Excedrin migraine is much as possible.  These medications along with other NSAID products including ibuprofen, Aleve, Advil, aspirin, naproxen, etc. can cause inflammation in your GI tract.  Continue fiber daily.  To help maintain your weight: Continue eating at least 2-3 meals per day. Try increasing your dietary protein from nonmeat sources. I also recommend you add a protein shake once or twice daily.  We will plan to see you back in the office after your procedures.  Do not hesitate to call if you have any questions or concerns prior to your next visit.   It was a pleasure meeting you today!  I hope you have a very Happy Thanksgiving!  Ermalinda Memos, PA-C [facility]

## 2021-06-04 DIAGNOSIS — F319 Bipolar disorder, unspecified: Secondary | ICD-10-CM | POA: Diagnosis not present

## 2021-06-16 ENCOUNTER — Telehealth: Payer: Self-pay | Admitting: Internal Medicine

## 2021-06-16 NOTE — Telephone Encounter (Signed)
Patient wants to cancel her tcs and just have the endo

## 2021-06-16 NOTE — Telephone Encounter (Signed)
I would encourage her to try to have the colonoscopy. Ultimately, it is up to her if she wants to cancel.

## 2021-06-16 NOTE — Telephone Encounter (Signed)
Called pt. She states she has a fear she is going to get a migraine and it is giving her really bad anxiety. Reports this happens when she doesn't eat. She isn't wanting to have the TCS done just the EGD. Please advise Baxter Hire thanks

## 2021-06-16 NOTE — Telephone Encounter (Signed)
Spoke with pt and she does not want to have TCS done only EGD. Message sent to endo. Consent form updated. New instructions for EGD sent to mychart.

## 2021-06-23 ENCOUNTER — Ambulatory Visit (HOSPITAL_COMMUNITY): Payer: Medicaid Other | Admitting: Anesthesiology

## 2021-06-23 ENCOUNTER — Ambulatory Visit (HOSPITAL_COMMUNITY)
Admission: RE | Admit: 2021-06-23 | Discharge: 2021-06-23 | Disposition: A | Discharge status: Home / Self Care | Payer: Medicaid Other | Attending: Internal Medicine | Admitting: Internal Medicine

## 2021-06-23 ENCOUNTER — Encounter (HOSPITAL_COMMUNITY)
Admission: RE | Disposition: A | Discharge status: Home / Self Care | Payer: Self-pay | Source: Home / Self Care | Attending: Internal Medicine

## 2021-06-23 ENCOUNTER — Other Ambulatory Visit: Payer: Self-pay

## 2021-06-23 ENCOUNTER — Encounter (HOSPITAL_COMMUNITY): Payer: Self-pay

## 2021-06-23 DIAGNOSIS — K582 Mixed irritable bowel syndrome: Secondary | ICD-10-CM | POA: Insufficient documentation

## 2021-06-23 DIAGNOSIS — K2970 Gastritis, unspecified, without bleeding: Secondary | ICD-10-CM | POA: Diagnosis not present

## 2021-06-23 DIAGNOSIS — K219 Gastro-esophageal reflux disease without esophagitis: Secondary | ICD-10-CM | POA: Diagnosis not present

## 2021-06-23 DIAGNOSIS — R1013 Epigastric pain: Secondary | ICD-10-CM | POA: Insufficient documentation

## 2021-06-23 DIAGNOSIS — R1310 Dysphagia, unspecified: Secondary | ICD-10-CM | POA: Diagnosis not present

## 2021-06-23 DIAGNOSIS — F418 Other specified anxiety disorders: Secondary | ICD-10-CM | POA: Diagnosis not present

## 2021-06-23 DIAGNOSIS — K2950 Unspecified chronic gastritis without bleeding: Secondary | ICD-10-CM | POA: Diagnosis not present

## 2021-06-23 DIAGNOSIS — K319 Disease of stomach and duodenum, unspecified: Secondary | ICD-10-CM | POA: Diagnosis not present

## 2021-06-23 DIAGNOSIS — K449 Diaphragmatic hernia without obstruction or gangrene: Secondary | ICD-10-CM | POA: Diagnosis not present

## 2021-06-23 HISTORY — PX: BALLOON DILATION: SHX5330

## 2021-06-23 HISTORY — PX: ESOPHAGOGASTRODUODENOSCOPY (EGD) WITH PROPOFOL: SHX5813

## 2021-06-23 HISTORY — PX: BIOPSY: SHX5522

## 2021-06-23 SURGERY — ESOPHAGOGASTRODUODENOSCOPY (EGD) WITH PROPOFOL
Anesthesia: General

## 2021-06-23 MED ORDER — LACTATED RINGERS IV SOLN: INTRAVENOUS | Status: DC

## 2021-06-23 MED ORDER — PROPOFOL 10 MG/ML IV BOLUS
INTRAVENOUS | Status: DC | PRN
  Administered 2021-06-23: 50 mg via INTRAVENOUS
  Administered 2021-06-23: 100 mg via INTRAVENOUS

## 2021-06-23 MED ORDER — LIDOCAINE HCL (CARDIAC) PF 100 MG/5ML IV SOSY
PREFILLED_SYRINGE | INTRAVENOUS | Status: DC | PRN
  Administered 2021-06-23: 50 mg via INTRAVENOUS

## 2021-06-23 NOTE — Anesthesia Postprocedure Evaluation (Signed)
Anesthesia Post Note  [name]  Procedure(s) Performed: ESOPHAGOGASTRODUODENOSCOPY (EGD) WITH PROPOFOL BALLOON DILATION BIOPSY  Patient location during evaluation: Endoscopy Anesthesia Type: General Level of consciousness: awake and alert and oriented Pain management: pain level controlled Vital Signs Assessment: post-procedure vital signs reviewed and stable Respiratory status: spontaneous breathing, nonlabored ventilation and respiratory function stable Cardiovascular status: blood pressure returned to baseline and stable Postop Assessment: no apparent nausea or vomiting Anesthetic complications: no   No notable events documented.   Last Vitals:  Vitals:   06/23/21 0643 06/23/21 0749  BP: 119/75 (!) 92/59  Pulse: 77   Resp: 13 11  Temp: 37 C 37 C  SpO2: 97% 98%    Last Pain:  Vitals:   06/23/21 0749  TempSrc: Oral  PainSc: 0-No pain                 [name]

## 2021-06-23 NOTE — Anesthesia Preprocedure Evaluation (Addendum)
Anesthesia Evaluation  Patient identified by MRN, date of birth, ID band Patient awake    Reviewed: Allergy & Precautions, H&P , NPO status , Patient's Chart, lab work & pertinent test results  Airway Mallampati: II  TM Distance: >3 FB Neck ROM: Full    Dental  (+) Dental Advisory Given, Chipped, Missing   Pulmonary neg pulmonary ROS,    Pulmonary exam normal breath sounds clear to auscultation       Cardiovascular Exercise Tolerance: Good hypertension, Pt. on medications Normal cardiovascular exam Rhythm:Regular Rate:Normal     Neuro/Psych PSYCHIATRIC DISORDERS Anxiety Depression negative neurological ROS     GI/Hepatic Neg liver ROS, GERD  Medicated and Controlled,  Endo/Other  negative endocrine ROS  Renal/GU negative Renal ROS  negative genitourinary   Musculoskeletal  (+) Arthritis ,   Abdominal   Peds negative pediatric ROS (+)  Hematology negative hematology ROS (+)   Anesthesia Other Findings   Reproductive/Obstetrics negative OB ROS                            Anesthesia Physical Anesthesia Plan  ASA: 2  Anesthesia Plan: General   Post-op Pain Management: Minimal or no pain anticipated   Induction: Intravenous  PONV Risk Score and Plan: TIVA  Airway Management Planned: Nasal Cannula and Natural Airway  Additional Equipment:   Intra-op Plan:   Post-operative Plan:   Informed Consent: I have reviewed the patients History and Physical, chart, labs and discussed the procedure including the risks, benefits and alternatives for the proposed anesthesia with the patient or authorized representative who has indicated his/her understanding and acceptance.     Dental advisory given  Plan Discussed with: CRNA and Surgeon  Anesthesia Plan Comments:         Anesthesia Quick Evaluation

## 2021-06-23 NOTE — Op Note (Signed)
[facility] Patient Name: Kathy Perry Procedure Date: 06/23/2021 7:20 AM MRN: [id number] Date of Birth: Aug 25, 1960 Attending MD: Elon Alas. Edgar Frisk CSN: [id number] Age: 60 Admit Type: Outpatient Procedure:                Upper GI endoscopy Indications:              Epigastric abdominal pain, Dysphagia Providers:                Elon Alas. Abbey Chatters, DO, Charlsie Quest. Theda Sers RN, RN,                            Nelma Rothman, Technician Referring MD:              Medicines:                See the Anesthesia note for documentation of the                            administered medications Complications:            No immediate complications. Estimated Blood Loss:     Estimated blood loss was minimal. Procedure:                Pre-Anesthesia Assessment:                           - The anesthesia plan was to use monitored                            anesthesia care (MAC).                           After obtaining informed consent, the endoscope was                            passed under direct vision. Throughout the                            procedure, the patient's blood pressure, pulse, and                            oxygen saturations were monitored continuously. The                            [id number]-H190 ([id number]) scope was introduced through the                            mouth, and advanced to the second part of duodenum.                            The upper GI endoscopy was accomplished without                            difficulty. The patient tolerated the procedure                            well. Scope  In: 7:41:18 AM Scope Out: 7:46:55 AM Total Procedure Duration: 0 hours 5 minutes 37 seconds  Findings:      The Z-line was regular and was found 36 cm from the incisors.      No endoscopic abnormality was evident in the esophagus to explain the       patient's complaint of dysphagia. Preparations were made for empiric       dilation. A TTS dilator was passed through the scope.  Dilation with an       18-19-20 mm balloon dilator was performed to 20 mm. Dilation was       performed with a mild resistance at 20 mm. Estimated blood loss was none.      Localized mild inflammation characterized by erosions and erythema was       found in the gastric antrum. Biopsies were taken with a cold forceps for       Helicobacter pylori testing.      The duodenal bulb, first portion of the duodenum and second portion of       the duodenum were normal.      A small hiatal hernia was present. Impression:               - Z-line regular, 36 cm from the incisors.                           - Gastritis. Biopsied.                           - Normal duodenal bulb, first portion of the                            duodenum and second portion of the duodenum.                           - Small hiatal hernia. Moderate Sedation:      Per Anesthesia Care Recommendation:           - Patient has a contact number available for                            emergencies. The signs and symptoms of potential                            delayed complications were discussed with the                            patient. Return to normal activities tomorrow.                            Written discharge instructions were provided to the                            patient.                           - Resume previous diet.                           - Continue  present medications.                           - Await pathology results.                           - Repeat upper endoscopy PRN for retreatment.                           - Return to GI clinic in 4 months.                           - Use Protonix (pantoprazole) 40 mg PO BID.                           - If symptoms not improved s/p dilation and acid                            suppression, then can consider esophageal manometry Procedure Code(s):        --- Professional ---                           [phone], Esophagogastroduodenoscopy, flexible,                             transoral; with biopsy, single or multiple Diagnosis Code(s):        --- Professional ---                           K29.70, Gastritis, unspecified, without bleeding                           R10.13, Epigastric pain                           R13.10, Dysphagia, unspecified CPT copyright 2019 American Medical Association. All rights reserved. The codes documented in this report are preliminary and upon coder review may  be revised to meet current compliance requirements. Elon Alas. Abbey Chatters, DO [place] Abbey Chatters, DO 06/23/2021 7:53:26 AM This report has been signed electronically. Number of Addenda: 0

## 2021-06-23 NOTE — Transfer of Care (Signed)
Immediate Anesthesia Transfer of Care Note  [name]  Procedure(s) Performed: ESOPHAGOGASTRODUODENOSCOPY (EGD) WITH PROPOFOL BALLOON DILATION BIOPSY  Patient Location: Endoscopy Unit  Anesthesia Type:General  Level of Consciousness: awake, alert  and oriented  Airway & Oxygen Therapy: Patient Spontanous Breathing  Post-op Assessment: Report given to RN and Post -op Vital signs reviewed and stable  Post vital signs: Reviewed and stable  Last Vitals:  Vitals Value Taken Time  BP    Temp    Pulse    Resp    SpO2      Last Pain:  Vitals:   06/23/21 0739  TempSrc:   PainSc: 0-No pain      Patients Stated Pain Goal: 5 (06/23/21 0643)  Complications: No notable events documented.

## 2021-06-23 NOTE — Interval H&P Note (Signed)
History and Physical Interval Note:  06/23/2021 7:33 AM  Kathy Perry  has presented today for surgery, with the diagnosis of dysphagia, gerd, wt loss.  The various methods of treatment have been discussed with the patient and family. After consideration of risks, benefits and other options for treatment, the patient has consented to  Procedure(s): ESOPHAGOGASTRODUODENOSCOPY (EGD) WITH PROPOFOL (N/A) BALLOON DILATION (N/A) as a surgical intervention.  The patient's history has been reviewed, patient examined, no change in status, stable for surgery.  I have reviewed the patient's chart and labs.  Questions were answered to the patient's satisfaction.     Lanelle Bal

## 2021-06-23 NOTE — Discharge Instructions (Addendum)
EGD Discharge instructions Please read the instructions outlined below and refer to this sheet in the next few weeks. These discharge instructions provide you with general information on caring for yourself after you leave the hospital. Your doctor may also give you specific instructions. While your treatment has been planned according to the most current medical practices available, unavoidable complications occasionally occur. If you have any problems or questions after discharge, please call your doctor. ACTIVITY You may resume your regular activity but move at a slower pace for the next 24 hours.  Take frequent rest periods for the next 24 hours.  Walking will help expel (get rid of) the air and reduce the bloated feeling in your abdomen.  No driving for 24 hours (because of the anesthesia (medicine) used during the test).  You may shower.  Do not sign any important legal documents or operate any machinery for 24 hours (because of the anesthesia used during the test).  NUTRITION Drink plenty of fluids.  You may resume your normal diet.  Begin with a light meal and progress to your normal diet.  Avoid alcoholic beverages for 24 hours or as instructed by your caregiver.  MEDICATIONS You may resume your normal medications unless your caregiver tells you otherwise.  WHAT YOU CAN EXPECT TODAY You may experience abdominal discomfort such as a feeling of fullness or "gas" pains.  FOLLOW-UP Your doctor will discuss the results of your test with you.  SEEK IMMEDIATE MEDICAL ATTENTION IF ANY OF THE FOLLOWING OCCUR: Excessive nausea (feeling sick to your stomach) and/or vomiting.  Severe abdominal pain and distention (swelling).  Trouble swallowing.  Temperature over 101 F (37.8 C).  Rectal bleeding or vomiting of blood.   Your EGD revealed mild amount inflammation in your stomach.  I took biopsies of this to rule out infection with a bacteria called H. pylori.  Await pathology results, my  office will contact you.  You also had a slight narrowing of your esophagus which I stretched with a balloon today.  Hopefully this helps with your swallowing.  Continue on pantoprazole twice daily.  Follow-up with GI in 4 months.    I hope you have a great rest of your week!  Hennie Duos. Marletta Lor, D.O. Gastroenterology and Hepatology [facility]

## 2021-06-25 DIAGNOSIS — F319 Bipolar disorder, unspecified: Secondary | ICD-10-CM | POA: Diagnosis not present

## 2021-06-25 LAB — SURGICAL PATHOLOGY

## 2021-06-26 ENCOUNTER — Encounter (HOSPITAL_COMMUNITY): Payer: Self-pay | Admitting: Internal Medicine

## 2021-07-09 DIAGNOSIS — F319 Bipolar disorder, unspecified: Secondary | ICD-10-CM | POA: Diagnosis not present

## 2021-07-21 ENCOUNTER — Telehealth: Payer: Self-pay

## 2021-07-21 ENCOUNTER — Other Ambulatory Visit: Payer: Self-pay | Admitting: *Deleted

## 2021-07-21 DIAGNOSIS — K219 Gastro-esophageal reflux disease without esophagitis: Secondary | ICD-10-CM

## 2021-07-21 MED ORDER — PANTOPRAZOLE SODIUM 40 MG PO TBEC: 40.0000 mg | DELAYED_RELEASE_TABLET | Freq: Two times a day (BID) | ORAL | 0 refills | Status: DC

## 2021-07-21 NOTE — Telephone Encounter (Signed)
Patient request refill pantoprazole (PROTONIX) 40 MG tablet   [facility]

## 2021-07-21 NOTE — Telephone Encounter (Signed)
Rx was sent into pharmacy.  

## 2021-07-27 ENCOUNTER — Ambulatory Visit: Payer: Medicaid Other | Admitting: Nurse Practitioner

## 2021-07-27 ENCOUNTER — Other Ambulatory Visit: Payer: Self-pay

## 2021-07-27 ENCOUNTER — Encounter: Payer: Self-pay | Admitting: Nurse Practitioner

## 2021-07-27 VITALS — BP 121/77 | HR 73 | Ht 66.0 in | Wt 142.0 lb

## 2021-07-27 DIAGNOSIS — E559 Vitamin D deficiency, unspecified: Secondary | ICD-10-CM

## 2021-07-27 DIAGNOSIS — R079 Chest pain, unspecified: Secondary | ICD-10-CM | POA: Insufficient documentation

## 2021-07-27 DIAGNOSIS — E7800 Pure hypercholesterolemia, unspecified: Secondary | ICD-10-CM

## 2021-07-27 DIAGNOSIS — F419 Anxiety disorder, unspecified: Secondary | ICD-10-CM

## 2021-07-27 DIAGNOSIS — M1990 Unspecified osteoarthritis, unspecified site: Secondary | ICD-10-CM | POA: Diagnosis not present

## 2021-07-27 DIAGNOSIS — I10 Essential (primary) hypertension: Secondary | ICD-10-CM | POA: Diagnosis not present

## 2021-07-27 DIAGNOSIS — T7840XA Allergy, unspecified, initial encounter: Secondary | ICD-10-CM | POA: Diagnosis not present

## 2021-07-27 DIAGNOSIS — Z23 Encounter for immunization: Secondary | ICD-10-CM | POA: Insufficient documentation

## 2021-07-27 MED ORDER — CETIRIZINE HCL 10 MG PO TABS: 10.0000 mg | ORAL_TABLET | Freq: Every day | ORAL | 11 refills | Status: DC

## 2021-07-27 NOTE — Patient Instructions (Addendum)
Please take tylenol 650 mg every 6 hours  as needed for your arthritis.  Please take zyrtec 10mg  daily for your allergies.  Please call your OBGYN to schedule your PAP Get your shingles vaccine at your pharmacy.   It is important that you exercise regularly at least 30 minutes 5 times a week.  Think about what you will eat, plan ahead. Choose " clean, green, fresh or frozen" over canned, processed or packaged foods which are more sugary, salty and fatty. 70 to 75% of food eaten should be vegetables and fruit. Three meals at set times with snacks allowed between meals, but they must be fruit or vegetables. Aim to eat over a 12 hour period , example 7 am to 7 pm, and STOP after  your last meal of the day. Drink water,generally about 64 ounces per day, no other drink is as healthy. Fruit juice is best enjoyed in a healthy way, by EATING the fruit.  Thanks for choosing [facility], we consider it a privelige to serve you.

## 2021-07-27 NOTE — Assessment & Plan Note (Signed)
Take zyrtec 10mg  daily. Refused saline  nasal spray and flonase

## 2021-07-27 NOTE — Progress Notes (Signed)
° °  Draya Felker     MRN: [id number]      DOB: 12-07-1960   HPI Ms. Stoltzfus is here for follow up and re-evaluation of chronic medical conditions, medication management and review of any available recent lab and radiology data.  Preventive health is updated, specifically  Cancer screening and Immunization.   Questions or concerns regarding consultations or procedures which the PT has had in the interim are  addressed. The PT denies any adverse reactions to current medications since the last visit.   Pt c/o bilateral chronic knee pain,left knee is worse that the right pt stated that she has arthritis, Pt c/o bilateral chronic right hip pain , right hip pain is worse than the left, she has tried tylenol arthritis without relief. She currently denies hip pain, left knee pain feels sore today. Pt denies tingling , numbness , she does have some stiffness in her leg joints. Knee pain is 1/10 today but it gets worse when she starts walking her dog, tylenol does not help much.   Pt c/o of intermittent sharp pain lasting about 1 sec. in the rib cage area around her breasts. Pain does not happen often. She has an upcoming mammogram in June , this year, she had a lumpectomy done two years ago. Last mammogram was normal. Refused a breast exam today  Pt c/o stuffy nose, sneezing, denies fever chills, she does not smoke, but lives with someone that smokes. She can not use flonase, makes her cough and sneeze more.      ROS Denies recent fever or chills. Denies sinus pressure, has nasal congestion,denies  ear pain or sore throat. Denies chest congestion, productive cough or wheezing. Denies chest pains, palpitations and leg swelling, has intermittent pain in her rib cage area, pain last less than a second and is intermittent  Denies abdominal pain, nausea, vomiting,diarrhea or constipation.   Denies dysuria, frequency, hesitancy or incontinence. Has bilateral knee joint pain, bilateral hip pain denies   swelling and limitation in mobility. Denies headaches, seizures, numbness, or tingling. Denies depression, anxiety or insomnia. Denies skin break down or rash.   PE  BP 121/77 (BP Location: Right Arm, Patient Position: Sitting, Cuff Size: Normal)    Pulse 73    Ht 5\' 6"  (1.676 m)    Wt 142 lb (64.4 kg)    LMP  (LMP Unknown)    SpO2 98%    BMI 22.92 kg/m   Patient alert and oriented and in no cardiopulmonary distress.  HEENT: No facial asymmetry, EOMI,     Neck supple .  Chest: Clear to auscultation bilaterally., non tender  CVS: S1, S2 no murmurs, no S3.Regular rate.  ABD: Soft non tender.   Ext: No edema  MS: Adequate ROM spine, shoulders, hips and knees.tenderness of right hip and left knee on palpation states it feels sore 1/10  Skin: Intact, no ulcerations or rash noted.  Psych: Good eye contact, normal affect. Memory intact not anxious or depressed appearing.  CNS: CN 2-12 intact, power,  normal throughout.no focal deficits noted.   Assessment & Plan

## 2021-07-27 NOTE — Assessment & Plan Note (Signed)
Bilateral hip and knee pain.  .  Can not take NSAIDS due to chronic kidney disease Take tylenol 650mg  as needed. Refer to oth. Pt told not to take goody powder with tylenol , she verbalized understanding

## 2021-07-27 NOTE — Assessment & Plan Note (Signed)
DASH diet and commitment to daily physical activity for a minimum of 30 minutes discussed and encouraged, as a part of hypertension management. The importance of attaining a healthy weight is also discussed.  BP/Weight 07/27/2021 06/23/2021 06/03/2021 05/20/2021 03/26/2021 12/25/2020 [id number]  Systolic BP [id number] 92 [id number] 123456 [id number]  Diastolic BP 77 59 76 74 55 68 82  Wt. (Lbs) 142 138.8 138.8 138.04 138 135 139.8  BMI 22.92 22.75 22.75 22.97 22.62 22.12 22.91  continue current meds.

## 2021-07-27 NOTE — Assessment & Plan Note (Signed)
Check lab today. Taking vitamin d , ergocalciferol daily

## 2021-07-27 NOTE — Assessment & Plan Note (Signed)
Condition well controlled, she has been seeing therapy.

## 2021-07-27 NOTE — Assessment & Plan Note (Addendum)
Rib cage area pain, intermittent pain less than one second when it happens  not occurring often. Will monitor. Use tylenol as needed. No dizziness, palpitation

## 2021-07-28 LAB — CMP14+EGFR
ALT: 36 IU/L — ABNORMAL HIGH (ref 0–32)
AST: 28 IU/L (ref 0–40)
Albumin/Globulin Ratio: 2.2 (ref 1.2–2.2)
Albumin: 4.8 g/dL (ref 3.8–4.9)
Alkaline Phosphatase: 93 IU/L (ref 44–121)
BUN/Creatinine Ratio: 18 (ref 12–28)
BUN: 23 mg/dL (ref 8–27)
Bilirubin Total: 0.3 mg/dL (ref 0.0–1.2)
CO2: 25 mmol/L (ref 20–29)
Calcium: 9.7 mg/dL (ref 8.7–10.3)
Chloride: 104 mmol/L (ref 96–106)
Creatinine, Ser: 1.3 mg/dL — ABNORMAL HIGH (ref 0.57–1.00)
Globulin, Total: 2.2 g/dL (ref 1.5–4.5)
Glucose: 94 mg/dL (ref 70–99)
Potassium: 4.9 mmol/L (ref 3.5–5.2)
Sodium: 143 mmol/L (ref 134–144)
Total Protein: 7 g/dL (ref 6.0–8.5)
eGFR: 47 mL/min/{1.73_m2} — ABNORMAL LOW (ref >59)

## 2021-07-28 LAB — LIPID PANEL
Chol/HDL Ratio: 2.2 ratio (ref 0.0–4.4)
Cholesterol, Total: 157 mg/dL (ref 100–199)
HDL: 71 mg/dL (ref >39)
LDL Chol Calc (NIH): 71 mg/dL (ref 0–99)
Triglycerides: 80 mg/dL (ref 0–149)
VLDL Cholesterol Cal: 15 mg/dL (ref 5–40)

## 2021-07-28 LAB — VITAMIN D 25 HYDROXY (VIT D DEFICIENCY, FRACTURES): Vit D, 25-Hydroxy: 79.1 ng/mL (ref 30.0–100.0)

## 2021-07-30 DIAGNOSIS — F319 Bipolar disorder, unspecified: Secondary | ICD-10-CM | POA: Diagnosis not present

## 2021-08-05 ENCOUNTER — Encounter (HOSPITAL_COMMUNITY): Payer: Self-pay

## 2021-08-10 ENCOUNTER — Ambulatory Visit: Payer: Medicaid Other | Admitting: Orthopedic Surgery

## 2021-08-10 ENCOUNTER — Other Ambulatory Visit: Payer: Self-pay

## 2021-08-10 ENCOUNTER — Ambulatory Visit: Payer: Medicaid Other

## 2021-08-10 ENCOUNTER — Encounter: Payer: Self-pay | Admitting: Orthopedic Surgery

## 2021-08-10 VITALS — BP 107/75 | HR 76 | Ht 65.5 in | Wt 144.4 lb

## 2021-08-10 DIAGNOSIS — G8929 Other chronic pain: Secondary | ICD-10-CM

## 2021-08-10 DIAGNOSIS — M170 Bilateral primary osteoarthritis of knee: Secondary | ICD-10-CM

## 2021-08-10 DIAGNOSIS — M25561 Pain in right knee: Secondary | ICD-10-CM

## 2021-08-10 MED ORDER — DICLOFENAC POTASSIUM 50 MG PO TABS: 50.0000 mg | ORAL_TABLET | Freq: Two times a day (BID) | ORAL | 3 refills | Status: DC

## 2021-08-10 NOTE — Progress Notes (Signed)
Chief Complaint  Patient presents with   Knee Pain    Left, chronic, stabbing, fell a year ago and then couple months ago, walks a large dog and that makes it worse.    HPI: Kathy Perry complains of bilateral knee pain left is worse than right.  She has knifelike pain on the medial side of both knees which tends to come and go.  She tried Tylenol it did not work she does get some relief from taking a Goody powder but it does not last long    Past Medical History:  Diagnosis Date   Anxiety    Arthritis    GERD (gastroesophageal reflux disease)    IBS (irritable bowel syndrome)    Need for Tdap vaccination 11/13/2019    BP 107/75    Pulse 76    Ht 5' 5.5" (1.664 m)    Wt 144 lb 6.4 oz (65.5 kg)    LMP  (LMP Unknown)    BMI 23.66 kg/m    General appearance: Well-developed well-nourished no gross deformities  Cardiovascular normal pulse and perfusion normal color without edema  Neurologically no sensation loss or deficits or pathologic reflexes  Psychological: Awake alert and oriented x3 mood and affect normal  Skin no lacerations or ulcerations no nodularity no palpable masses, no erythema or nodularity  Musculoskeletal:   Right and left knee there is no effusion there is tenderness on the medial joint line muscle tone is normal strength is good both knees are stable    Imaging outside images left knee mild OA  Image taken right knee in the office mild OA primarily medial  A/P  Encounter Diagnoses  Name Primary?   Chronic pain of right knee    Primary osteoarthritis of both knees Yes   Meds ordered this encounter  Medications   diclofenac (CATAFLAM) 50 MG tablet    Sig: Take 1 tablet (50 mg total) by mouth 2 (two) times daily.    Dispense:  90 tablet    Refill:  3   If after 2 weeks there is no improvement patient should let us know and we can change the medication

## 2021-08-12 ENCOUNTER — Telehealth: Payer: Self-pay | Admitting: Radiology

## 2021-08-12 NOTE — Telephone Encounter (Signed)
request received via fax for patients pharmacy to change diclofenac its not preferred drug  The preferred are Meloxicam  Celecoxib Ibuprofen  Naproxen

## 2021-08-13 ENCOUNTER — Other Ambulatory Visit: Payer: Self-pay | Admitting: Orthopedic Surgery

## 2021-08-13 ENCOUNTER — Ambulatory Visit: Payer: Medicaid Other

## 2021-08-13 ENCOUNTER — Ambulatory Visit: Payer: Medicaid Other | Admitting: Orthopedic Surgery

## 2021-08-13 ENCOUNTER — Other Ambulatory Visit: Payer: Self-pay

## 2021-08-13 ENCOUNTER — Encounter: Payer: Self-pay | Admitting: Orthopedic Surgery

## 2021-08-13 DIAGNOSIS — M25551 Pain in right hip: Secondary | ICD-10-CM

## 2021-08-13 MED ORDER — MELOXICAM 7.5 MG PO TABS: 7.5000 mg | ORAL_TABLET | Freq: Every day | ORAL | 5 refills | Status: DC

## 2021-08-13 NOTE — Patient Instructions (Signed)
Take medication every other day

## 2021-08-13 NOTE — Progress Notes (Signed)
Meds ordered this encounter  Medications   meloxicam (MOBIC) 7.5 MG tablet    Sig: Take 1 tablet (7.5 mg total) by mouth daily.    Dispense:  30 tablet    Refill:  5    

## 2021-08-13 NOTE — Progress Notes (Signed)
Chief Complaint  Patient presents with   Hip Pain    RT   61 year old female seen few days ago for her knees had osteoarthritis she was started on a anti-inflammatory which she has not started just yet  The diclofenac is not a preferred drug for her so she will need that switched  Complains of pain on the right iliac crest and some went into the right groin, also has a lesser degree of pain on the left side around the hip as well  Denies any hip trauma   Physical Exam Constitutional:      General: She is not in acute distress.    Appearance: She is well-developed.     Comments: Well developed, well nourished Normal grooming and hygiene     Cardiovascular:     Comments: No peripheral edema Musculoskeletal:     Right hip: No deformity, tenderness or bony tenderness. Normal range of motion.     Left hip: No deformity, tenderness or bony tenderness. Normal range of motion.  Skin:    General: Skin is warm and dry.  Neurological:     Mental Status: She is alert and oriented to person, place, and time.     Sensory: No sensory deficit.     Coordination: Coordination normal.     Gait: Gait normal.     Deep Tendon Reflexes: Reflexes are normal and symmetric.  Psychiatric:        Mood and Affect: Mood normal.        Behavior: Behavior normal.        Thought Content: Thought content normal.        Judgment: Judgment normal.     Comments: Affect normal    Lab results  Patient recent GFR was less than normal  There is some concern about NSAID therapy  Recommend every other day medication.  Diclofenac was not on her preferred list so we switched over to meloxicam  Her x-ray shows no arthritis in the hips  If she does not get good relief after couple weeks she is going to give [place] a call we can try another preferred medication  There are no surgical indications here based on her radiographic images

## 2021-08-21 ENCOUNTER — Other Ambulatory Visit: Payer: Self-pay

## 2021-08-21 DIAGNOSIS — K219 Gastro-esophageal reflux disease without esophagitis: Secondary | ICD-10-CM

## 2021-08-21 MED ORDER — PANTOPRAZOLE SODIUM 40 MG PO TBEC: 40.0000 mg | DELAYED_RELEASE_TABLET | Freq: Two times a day (BID) | ORAL | 3 refills | Status: DC

## 2021-09-08 ENCOUNTER — Other Ambulatory Visit (HOSPITAL_COMMUNITY)
Admission: RE | Admit: 2021-09-08 | Discharge: 2021-09-08 | Disposition: A | Discharge status: Home / Self Care | Payer: Medicaid Other | Source: Ambulatory Visit | Attending: Adult Health | Admitting: Adult Health

## 2021-09-08 ENCOUNTER — Ambulatory Visit (INDEPENDENT_AMBULATORY_CARE_PROVIDER_SITE_OTHER): Payer: Medicaid Other | Admitting: Adult Health

## 2021-09-08 ENCOUNTER — Encounter: Payer: Self-pay | Admitting: Adult Health

## 2021-09-08 ENCOUNTER — Other Ambulatory Visit: Payer: Self-pay

## 2021-09-08 VITALS — BP 121/78 | HR 78 | Ht 64.25 in | Wt 144.5 lb

## 2021-09-08 DIAGNOSIS — Z1211 Encounter for screening for malignant neoplasm of colon: Secondary | ICD-10-CM | POA: Diagnosis not present

## 2021-09-08 DIAGNOSIS — R195 Other fecal abnormalities: Secondary | ICD-10-CM | POA: Diagnosis not present

## 2021-09-08 DIAGNOSIS — N393 Stress incontinence (female) (male): Secondary | ICD-10-CM | POA: Diagnosis not present

## 2021-09-08 DIAGNOSIS — Z0000 Encounter for general adult medical examination without abnormal findings: Secondary | ICD-10-CM | POA: Insufficient documentation

## 2021-09-08 DIAGNOSIS — Z01419 Encounter for gynecological examination (general) (routine) without abnormal findings: Secondary | ICD-10-CM | POA: Diagnosis not present

## 2021-09-08 DIAGNOSIS — Z780 Asymptomatic menopausal state: Secondary | ICD-10-CM | POA: Diagnosis not present

## 2021-09-08 HISTORY — DX: Asymptomatic menopausal state: Z78.0

## 2021-09-08 HISTORY — DX: Stress incontinence (female) (male): N39.3

## 2021-09-08 HISTORY — DX: Other fecal abnormalities: R19.5

## 2021-09-08 LAB — HEMOCCULT GUIAC POC 1CARD (OFFICE): Fecal Occult Blood, POC: POSITIVE — AB

## 2021-09-08 NOTE — Progress Notes (Signed)
Patient ID: Kathy Perry, female   DOB: 02-Mar-1961, 61 y.o.   MRN: [id number] History of Present Illness: Kathy Perry is a 62 year old white female, married, PM in for a well woman gyn exam and pap. She is having urine leakage with coughing or sneezing.  Has lost 40 lbs in about 3 years, changed eating habits and walks her dog.  Her husband is disabled and has COPD, her daughter had breat cancer at 76. PCP is F. Paseda NP   Current Medications, Allergies, Past Medical History, Past Surgical History, Family History and Social History were reviewed in [employer] record.     Review of Systems: Patient denies any headaches, hearing loss, fatigue, blurred vision, shortness of breath, chest pain, abdominal pain, problems with bowel movements, or intercourse.(Not active). No joint pain or mood swings.  See HPI for positives.   Physical Exam:BP 121/78 (BP Location: Left Arm, Patient Position: Sitting, Cuff Size: Normal)    Pulse 78    Ht 5' 4.25" (1.632 m)    Wt 144 lb 8 oz (65.5 kg)    LMP  (LMP Unknown)    BMI 24.61 kg/m   General:  Well developed, well nourished, no acute distress Skin:  Warm and dry Neck:  Midline trachea, normal thyroid, good ROM, no lymphadenopathy,no carotid bruits heard Lungs; Clear to auscultation bilaterally Breast:  No dominant palpable mass, retraction, or nipple discharge Cardiovascular: Regular rate and rhythm Abdomen:  Soft, non tender, no hepatosplenomegaly Pelvic:  External genitalia is normal in appearance, no lesions.  The vagina is pale with loss if moisture and rugae. Urethra has no lesions or masses. The cervix is smooth, pap with HR HPV genotyping performed.  Uterus is felt to be normal size, shape, and contour.  No adnexal masses or tenderness noted.Bladder is non tender, no masses felt. Rectal: Good sphincter tone, no polyps, or hemorrhoids felt.  Hemoccult positive. Extremities/musculoskeletal:  No swelling or varicosities noted, no  clubbing or cyanosis Psych:  No mood changes, alert and cooperative,seems happy AA is 0 Fall risk is high Depression screen [facility] 2/9 09/08/2021 07/27/2021 04/15/2021  Decreased Interest 2 0 0  Down, Depressed, Hopeless 2 0 0  PHQ - 2 Score 4 0 0  Altered sleeping 1 0 -  Tired, decreased energy 1 0 -  Change in appetite 3 0 -  Feeling bad or failure about yourself  1 0 -  Trouble concentrating 0 0 -  Moving slowly or fidgety/restless 0 0 -  Suicidal thoughts 0 0 -  PHQ-9 Score 10 0 -  Difficult doing work/chores - - -  Some recent data might be hidden     GAD 7 : Generalized Anxiety Score 09/08/2021 03/26/2021 08/07/2020 07/07/2020  Nervous, Anxious, on Edge 1 3 1 3   Control/stop worrying 3 2 1 3   Worry too much - different things 3 3 1 3   Trouble relaxing 1 3 0 0  Restless 1 1 0 2  Easily annoyed or irritable 0 1 0 3  Afraid - awful might happen 0 0 0 3  Total GAD 7 Score 9 13 3 17   Anxiety Difficulty - Somewhat difficult - Somewhat difficult  She sees [facility] in [place] for therapy,and in [place]    [facility] - 09/08/21 1335       Pregnancy Intention Screening   Does the patient want to become pregnant in the next year? No    Does the patient's partner want to become  pregnant in the next year? No    Would the patient like to discuss contraceptive options today? No      Contraception Wrap Up   Current Method Female Sterilization   postmenopausal   End Method Female Sterilization   postmenopausal   Contraception Counseling Provided No            Examination chaperoned by Malachy Mood LPN   Impression and Plan: 1. Routine general medical examination at a health care facility Pap sent Physical with PCP next year Labs with PCP Pap in 3 years if normal  Mammogram yearly   2. Encounter for gynecological examination with Papanicolaou smear of cervix Pap sent Pap in 3 years if normal  3. Encounter for screening fecal occult blood testing  4.  Fecal occult blood test positive Sent 3 Hemoccult cards home to do and return if any + will refer to GI   5. Postmenopause  6. SUI (stress urinary incontinence, female) She declines medication Try to void often

## 2021-09-11 LAB — CYTOLOGY - PAP
Comment: NEGATIVE
Diagnosis: NEGATIVE
High risk HPV: NEGATIVE

## 2021-09-14 ENCOUNTER — Other Ambulatory Visit: Payer: Self-pay | Admitting: Family Medicine

## 2021-09-14 DIAGNOSIS — F319 Bipolar disorder, unspecified: Secondary | ICD-10-CM | POA: Diagnosis not present

## 2021-09-18 ENCOUNTER — Telehealth: Payer: Self-pay | Admitting: *Deleted

## 2021-09-18 ENCOUNTER — Other Ambulatory Visit (INDEPENDENT_AMBULATORY_CARE_PROVIDER_SITE_OTHER): Payer: Medicaid Other

## 2021-09-18 DIAGNOSIS — Z1212 Encounter for screening for malignant neoplasm of rectum: Secondary | ICD-10-CM

## 2021-09-18 DIAGNOSIS — Z1211 Encounter for screening for malignant neoplasm of colon: Secondary | ICD-10-CM | POA: Diagnosis not present

## 2021-09-18 LAB — HEMOCCULT GUIAC POC 1CARD (OFFICE)
Card #2 Fecal Occult Blod, POC: NEGATIVE
Card #3 Fecal Occult Blood, POC: NEGATIVE
Fecal Occult Blood, POC: NEGATIVE

## 2021-09-18 NOTE — Telephone Encounter (Signed)
Pt's hemocult cards were negative X 3. Pt aware. JSY ?

## 2021-09-21 DIAGNOSIS — F319 Bipolar disorder, unspecified: Secondary | ICD-10-CM | POA: Diagnosis not present

## 2021-09-28 DIAGNOSIS — F319 Bipolar disorder, unspecified: Secondary | ICD-10-CM | POA: Diagnosis not present

## 2021-10-07 ENCOUNTER — Ambulatory Visit: Payer: Medicaid Other | Admitting: Gastroenterology

## 2021-10-30 DIAGNOSIS — F321 Major depressive disorder, single episode, moderate: Secondary | ICD-10-CM | POA: Diagnosis not present

## 2021-10-30 DIAGNOSIS — F411 Generalized anxiety disorder: Secondary | ICD-10-CM | POA: Diagnosis not present

## 2021-11-02 NOTE — Progress Notes (Signed)
? ? ?Referring Provider: Heather Roberts, NP ?Primary Care Physician:  Donell Beers, FNP ?Primary GI Physician: Dr. Marletta Lor ? ?Chief Complaint  ?Patient presents with  ? Follow-up  ? ? ?HPI:   ?Kathy Perry is a 61 y.o. female with history of anxiety, GERD, IBS, presenting today for follow-up of dysphagia s/p EGD.  ? ?Last seen in our office 06/03/2021.  Reported items going down her esophagus slowly and occasionally getting stuck in her mid chest.  GERD well controlled on Protonix 40 mg twice daily.  She was taking 1-2 Goody Powders daily for hip and knee pain as well as Excedrin Migraine as needed.  Recommended EGD for further evaluation of dysphagia and limit NSAIDs.  Also noted weight loss between 2020 and 2022 that was intentional with significant dietary changes and increased activity.  Her weight had been stable since April 2022.  No significant lower GI symptoms. IBS controlled with fiber.  She needed first-ever colonoscopy, and we would plan to arrange this at the time of EGD. ? ?Patient called to cancel her colonoscopy as she was concerned about getting a migraine if she did not eat. ? ?EGD 06/23/2021: Normal esophagus s/p empiric dilation, hiatal hernia, gastritis biopsied, normal examined duodenum.  Pathology with reactive gastropathy, negative for H. pylori. ? ?In the interim, patient saw OB/GYN and had positive fecal occult blood test on 09/08/2021, but negative fecal occult blood test on 09/18/2021. ? ? ?Today: ? ?Dysphagia: Improved. Only with 2 episodes of feeling items going down slowly since EGD. ? ?GERD: Well controlled on Protonix twice daily.  Denies nausea, vomiting, abdominal pain. ? ?IBS symptoms well controlled on fiber. Bowels moving well. No blood in the stools or black stools. No abdominal pain.  ? ?States she can't do colonoscopy as she is concerned about having a migraine if she does not eat or only has clear liquids. Wants to do cologuard.  Discussed if she has a positive  Cologuard, then we would need to proceed with colonoscopy.  She states she would proceed with a colonoscopy if the Cologuard came back positive.  Her last Cologuard was negative in August 2020. ? ?Past Medical History:  ?Diagnosis Date  ? Anxiety   ? Arthritis   ? GERD (gastroesophageal reflux disease)   ? IBS (irritable bowel syndrome)   ? Need for Tdap vaccination 11/13/2019  ? ? ?Past Surgical History:  ?Procedure Laterality Date  ? BALLOON DILATION N/A 06/23/2021  ? Procedure: BALLOON DILATION;  Surgeon: Lanelle Bal, DO;  [facility];  Service: Endoscopy;  Laterality: N/A;  ? BIOPSY  06/23/2021  ? Procedure: BIOPSY;  Surgeon: Lanelle Bal, DO;  [facility];  Service: Endoscopy;;  ? BREAST EXCISIONAL BIOPSY    ? BREAST LUMPECTOMY WITH RADIOACTIVE SEED LOCALIZATION Left 02/28/2020  ? Procedure: LEFT BREAST LUMPECTOMY WITH RADIOACTIVE SEED LOCALIZATION;  Surgeon: Manus Rudd, MD;  Location: [facility];  Service: General;  Laterality: Left;  LMA  ? ESOPHAGOGASTRODUODENOSCOPY (EGD) WITH PROPOFOL N/A 06/23/2021  ? Procedure: ESOPHAGOGASTRODUODENOSCOPY (EGD) WITH PROPOFOL;  Surgeon: Lanelle Bal, DO;  [facility];  Service: Endoscopy;  Laterality: N/A;  ? TUBAL LIGATION    ? WISDOM TOOTH EXTRACTION    ? ? ?Current Outpatient Medications  ?Medication Sig Dispense Refill  ? amLODipine (NORVASC) 5 MG tablet TAKE 1 TABLET BY MOUTH ONCE A DAY. 90 tablet 0  ? Aspirin-Acetaminophen-Caffeine (GOODYS EXTRA STRENGTH) [phone] MG PACK Take 1 Package by  mouth daily as needed (pain).    ? atorvastatin (LIPITOR) 40 MG tablet TAKE 1 TABLET BY MOUTH ONCE A DAY. 90 tablet 0  ? pantoprazole (PROTONIX) 40 MG tablet Take 1 tablet (40 mg total) by mouth 2 (two) times daily before a meal. 60 tablet 3  ? VITAMIN D, ERGOCALCIFEROL, PO Take 125 mcg by mouth daily.    ? cetirizine (ZYRTEC) 10 MG tablet Take 1 tablet (10 mg total) by mouth daily. (Patient not taking: Reported  on 09/08/2021) 30 tablet 11  ? ?No current facility-administered medications for this visit.  ? ? ?Allergies as of 11/04/2021 - Review Complete 11/04/2021  ?Allergen Reaction Noted  ? Codeine Other (See Comments) 04/05/2015  ? ? ?Family History  ?Problem Relation Age of Onset  ? Breast cancer Paternal Grandmother   ? Diabetes Father   ? Cancer Father   ?     not sure primary  ? Memory loss Mother   ? Cancer Brother   ?     thyroid  ? COPD Brother   ? Breast cancer Daughter   ? Breast cancer Paternal Aunt   ? Lung cancer Paternal Aunt   ? Colon cancer Neg Hx   ? Esophageal cancer Neg Hx   ? Gastric cancer Neg Hx   ? ? ?Social History  ? ?Socioeconomic History  ? Marital status: Married  ?  Spouse name: Virl Diamond  ? Number of children: 3  ? Years of education: Not on file  ? Highest education level: 9th grade  ?Occupational History  ? Occupation: homemaker  ?Tobacco Use  ? Smoking status: Never  ? Smokeless tobacco: Never  ?Vaping Use  ? Vaping Use: Never used  ?Substance and Sexual Activity  ? Alcohol use: No  ? Drug use: Never  ? Sexual activity: Not Currently  ?  Birth control/protection: Surgical, Post-menopausal  ?  Comment: tubal  ?Other Topics Concern  ? Not on file  ?Social History Narrative  ? Lives with [facility] years  ? 1 son, 2 daughters  ? 4 grandchildren   ?   ? Cat: Lucy  ? Dog: Roofus   ?   ? Enjoys: walking with dog, flowers, sewing, iron   ?   ? Diet: eats all food groups-limited meat   ? Caffeine: some coke   ? Water: 2-4 cups of water  ?   ? Wears seat belt  ? Smoke detectors at home   ? ?Social Determinants of Health  ? ?Financial Resource Strain: Low Risk   ? Difficulty of Paying Living Expenses: Not very hard  ?Food Insecurity: No Food Insecurity  ? Worried About Programme researcher, broadcasting/film/video in the Last Year: Never true  ? Ran Out of Food in the Last Year: Never true  ?Transportation Needs: No Transportation Needs  ? Lack of Transportation (Medical): No  ? Lack of Transportation (Non-Medical): No   ?Physical Activity: Insufficiently Active  ? Days of Exercise per Week: 7 days  ? Minutes of Exercise per Session: 20 min  ?Stress: Stress Concern Present  ? Feeling of Stress : To some extent  ?Social Connections: Moderately Isolated  ? Frequency of Communication with Friends and Family: More than three times a week  ? Frequency of Social Gatherings with Friends and Family: Once a week  ? Attends Religious Services: Never  ? Active Member of Clubs or Organizations: No  ? Attends Banker Meetings: Never  ? Marital Status: Married  ? ? ?  Review of Systems: ?Gen: Denies fever, chills, cold or flulike symptoms, presyncope, syncope. ?CV: Denies chest pain, palpitations. ?Resp: Denies dyspnea or cough. ?GI: See HPI ?Heme: See HPI ? ?Physical Exam: ?BP 120/78   Pulse 80   Temp 97.7 ?F (36.5 ?C) (Temporal)   Ht 5\' 5"  (1.651 m)   Wt 147 lb 12.8 oz (67 kg)   LMP  (LMP Unknown)   BMI 24.60 kg/m?  ?General:   Alert and oriented. No distress noted. Pleasant and cooperative.  ?Head:  Normocephalic and atraumatic. ?Eyes:  Conjuctiva clear without scleral icterus. ?Heart:  S1, S2 present without murmurs appreciated. ?Lungs:  Clear to auscultation bilaterally. No wheezes, rales, or rhonchi. No distress.  ?Abdomen:  +BS, soft, non-tender and non-distended. No rebound or guarding. No HSM or masses noted. ?Msk:  Symmetrical without gross deformities. Normal posture. ?Extremities:  Without edema. ?Neurologic:  Alert and  oriented x4 ?Psych:  Normal mood and affect. ? ? ? ?Assessment:  ?61 year old female with history of anxiety, GERD, IBS, presenting today for follow-up of dysphagia s/p EGD 06/23/2021 which revealed normal esophagus s/p empiric dilation, hiatal hernia, gastritis biopsied, normal examined duodenum.  Pathology with reactive gastropathy, negative for H. pylori.. ? ?Dysphagia: ?Improved following EGD with empiric dilation.  Only with 2 episodes of feeling items going down slowly since EGD.  Advised to  continue to monitor for recurrent symptoms and let us know. ? ?GERD: ?Well-controlled on Protonix 40 mg twice daily.  Advise she can try decreasing to once daily and monitor for recurrent symptoms.  May resume twice d

## 2021-11-03 ENCOUNTER — Encounter: Payer: Self-pay | Admitting: *Deleted

## 2021-11-04 ENCOUNTER — Ambulatory Visit: Payer: Medicaid Other | Admitting: Gastroenterology

## 2021-11-04 ENCOUNTER — Encounter: Payer: Self-pay | Admitting: Gastroenterology

## 2021-11-04 VITALS — BP 120/78 | HR 80 | Temp 97.7°F | Ht 65.0 in | Wt 147.8 lb

## 2021-11-04 DIAGNOSIS — R1310 Dysphagia, unspecified: Secondary | ICD-10-CM | POA: Diagnosis not present

## 2021-11-04 DIAGNOSIS — K219 Gastro-esophageal reflux disease without esophagitis: Secondary | ICD-10-CM | POA: Diagnosis not present

## 2021-11-04 DIAGNOSIS — Z1211 Encounter for screening for malignant neoplasm of colon: Secondary | ICD-10-CM

## 2021-11-04 NOTE — Patient Instructions (Signed)
Try decreasing Protonix to 40 mg twice daily 30 minutes before breakfast.  Monitor for recurrent reflux symptoms.  If you are having breakthrough symptoms 2-3 times a week, resume Protonix twice daily. ? ?Follow a GERD diet:  ?Avoid fried, fatty, greasy, spicy, citrus foods. ?Avoid caffeine and carbonated beverages. ?Avoid chocolate. ?Try eating 4-6 small meals a day rather than 3 large meals. ?Do not eat within 3 hours of laying down. ?Prop head of bed up on wood or bricks to create a 6 inch incline. ? ?Swallowing precautions:  ?Eat slowly, take small bites, chew thoroughly, drink plenty of liquids throughout meals.  ? ?We will place you on recall for Cologuard in August.  You should receive a letter or call from our office around that time to get this arranged. ? ?We will plan to follow-up with you in the office in 1 year.  Do not hesitate to call sooner if needed. ? ?It was great to see you again today! ? ?Ermalinda Memos, PA-C ?[facility] ? ?

## 2021-11-24 ENCOUNTER — Encounter: Payer: Self-pay | Admitting: Nurse Practitioner

## 2021-11-24 ENCOUNTER — Ambulatory Visit (INDEPENDENT_AMBULATORY_CARE_PROVIDER_SITE_OTHER): Payer: Medicaid Other | Admitting: Nurse Practitioner

## 2021-11-24 VITALS — BP 103/70 | HR 64 | Ht 65.0 in | Wt 146.0 lb

## 2021-11-24 DIAGNOSIS — F419 Anxiety disorder, unspecified: Secondary | ICD-10-CM

## 2021-11-24 DIAGNOSIS — F321 Major depressive disorder, single episode, moderate: Secondary | ICD-10-CM | POA: Diagnosis not present

## 2021-11-24 DIAGNOSIS — Z139 Encounter for screening, unspecified: Secondary | ICD-10-CM

## 2021-11-24 DIAGNOSIS — N1831 Chronic kidney disease, stage 3a: Secondary | ICD-10-CM | POA: Diagnosis not present

## 2021-11-24 DIAGNOSIS — E7800 Pure hypercholesterolemia, unspecified: Secondary | ICD-10-CM | POA: Diagnosis not present

## 2021-11-24 DIAGNOSIS — N183 Chronic kidney disease, stage 3 unspecified: Secondary | ICD-10-CM

## 2021-11-24 DIAGNOSIS — I10 Essential (primary) hypertension: Secondary | ICD-10-CM | POA: Diagnosis not present

## 2021-11-24 DIAGNOSIS — R079 Chest pain, unspecified: Secondary | ICD-10-CM | POA: Diagnosis not present

## 2021-11-24 HISTORY — DX: Chronic kidney disease, stage 3 unspecified: N18.30

## 2021-11-24 NOTE — Patient Instructions (Signed)
Please get your shingles vaccine at the vaccine at your pharmacy . ? ?It is important that you exercise regularly at least 30 minutes 5 times a week.  ?Think about what you will eat, plan ahead. ?Choose " clean, green, fresh or frozen" over canned, processed or packaged foods which are more sugary, salty and fatty. ?70 to 75% of food eaten should be vegetables and fruit. ?Three meals at set times with snacks allowed between meals, but they must be fruit or vegetables. ?Aim to eat over a 12 hour period , example 7 am to 7 pm, and STOP after  your last meal of the day. ?Drink water,generally about 64 ounces per day, no other drink is as healthy. Fruit juice is best enjoyed in a healthy way, by EATING the fruit. ? ?Thanks for choosing [facility], we consider it a privelige to serve you.  ?

## 2021-11-24 NOTE — Assessment & Plan Note (Signed)
Chronic condition well-controlled continue sertraline 25 mg daily ?

## 2021-11-24 NOTE — Assessment & Plan Note (Addendum)
BP Readings from Last 3 Encounters:  ?11/24/21 103/70  ?11/04/21 120/78  ?09/08/21 121/78  ?Chronic condition well-controlled on amlodipine 5 mg daily, I discussed switching to losartan 25mg  daily due to pt's CKD once labs are back,  patient had cough with lisinopril in the past.  Plan discussed with patient she verbalized understanding. ?Continue amlodipine 5 mg for now ?Continue current medication DASH diet advised continue to exercise daily ?CMP ordered ?

## 2021-11-24 NOTE — Assessment & Plan Note (Deleted)
I do no suspect cardiac origin based on history provided. ?Symptom  muscular skeletal in nature, most likely due to pulling and pushing of her 55 pound dog. ?We will monitor for now ?Take OTC  Tylenol as needed ? ?

## 2021-11-24 NOTE — Progress Notes (Addendum)
? ?Complete physical exam ? ?[name]   DOB: 04/17/1961   61 y.o. Female  MRN: [id number] ? ?Subjective:  ?  ?Chief Complaint  ?Patient presents with  ? Annual Exam  ?  cpe  ? ? ?Kathy Perry is a 61 y.o. female with past medical history of primary hypertension, GERD, dysphagia, depression, anxiety who presents today for follow up for her chronic medical conditions and labs . Last annual physical with PAP was done by OGYN in Feb 2023. She reports consuming a low sodium, salt diet. She walk about 3 times daily ,  She generally feels well. She reports sleeping fairly well. ? ?Wears prescription glasses, has upcoming eye exam in November 2023,  she will be due for mammogram in June, states she is moving to [place] and will find a place to do her mammogram over there.  ? ?Recently had all positive fecal occult test ,due for colon cancer screening (cologuard) in August. ? ? ?Pt states that she sometimes feels soreness in her chest , '' feels her chest freezes up sometime', does not feel bad before and after having the episodes, soreness last only few seconds states that she has a 55lb dog that she walks everyday , she has to do some pulling and pushing when walking the dog. Pt denies SOB, syncope. ? ? ? ? ?Most recent fall risk assessment: ? ?  11/24/2021  ?  1:13 PM  ?Fall Risk   ?Falls in the past year? 1  ?Number falls in past yr: 0  ?Injury with Fall? 0  ?Risk for fall due to : History of fall(s)  ?Follow up Falls evaluation completed  ? ?  ?Most recent depression screenings: ? ?  11/24/2021  ?  1:14 PM 09/08/2021  ?  1:41 PM  ?PHQ 2/9 Scores  ?PHQ - 2 Score 0 4  ?PHQ- 9 Score  10  ? ? ? ? ? ? ?Patient Care Team: ?Renee Rival, FNP as PCP - General (Nurse Practitioner) ?Herminio Commons, MD (Inactive) as PCP - Cardiology (Cardiology) ?Fields, Marga Melnick, MD (Inactive) as Consulting Physician (Gastroenterology) ?Eloise Harman, DO as Consulting Physician (Internal Medicine)   ? ?Outpatient Medications Prior to Visit  ?Medication Sig Note  ? amLODipine (NORVASC) 5 MG tablet TAKE 1 TABLET BY MOUTH ONCE A DAY.   ? Aspirin-Acetaminophen-Caffeine (GOODYS EXTRA STRENGTH) 500-325-65 MG PACK Take 1 Package by mouth daily as needed (pain).   ? atorvastatin (LIPITOR) 40 MG tablet TAKE 1 TABLET BY MOUTH ONCE A DAY.   ? pantoprazole (PROTONIX) 40 MG tablet Take 1 tablet (40 mg total) by mouth 2 (two) times daily before a meal. 11/24/2021: Once a day   ? sertraline (ZOLOFT) 25 MG tablet Take 25 mg by mouth daily.   ? VITAMIN D, ERGOCALCIFEROL, PO Take 125 mcg by mouth daily.   ? cetirizine (ZYRTEC) 10 MG tablet Take 1 tablet (10 mg total) by mouth daily. (Patient not taking: Reported on 09/08/2021)   ? ?No facility-administered medications prior to visit.  ? ? ?Review of Systems  ?Constitutional: Negative.   ?Eyes: Negative.   ?Respiratory: Negative.    ?Musculoskeletal:  Positive for joint pain. Negative for back pain, falls, myalgias and neck pain.  ?Neurological: Negative.   ?Psychiatric/Behavioral: Negative.    ? ? ? ? ?   ?Objective:  ? ?  ?BP 103/70 (BP Location: Left Arm, Patient Position: Sitting, Cuff Size: Large)   Pulse 64   Ht 5'  5" (1.651 m)   Wt 146 lb (66.2 kg)   LMP  (LMP Unknown)   SpO2 95%   BMI 24.30 kg/m?  ? ? ?Physical Exam ?Constitutional:   ?   General: She is not in acute distress. ?   Appearance: Normal appearance. She is not ill-appearing, toxic-appearing or diaphoretic.  ?Cardiovascular:  ?   Rate and Rhythm: Normal rate and regular rhythm.  ?   Pulses: Normal pulses.  ?   Heart sounds: Normal heart sounds. No murmur heard. ?  No friction rub. No gallop.  ?Pulmonary:  ?   Effort: Pulmonary effort is normal. No respiratory distress.  ?   Breath sounds: No stridor. No wheezing, rhonchi or rales.  ?Chest:  ?   Chest wall: No tenderness.  ?Musculoskeletal:     ?   General: No swelling, tenderness, deformity or signs of injury. Normal range of motion.  ?   Right lower leg:  No edema.  ?   Left lower leg: No edema.  ?Neurological:  ?   Mental Status: She is alert and oriented to person, place, and time.  ?Psychiatric:     ?   Mood and Affect: Mood normal.     ?   Behavior: Behavior normal.     ?   Thought Content: Thought content normal.     ?   Judgment: Judgment normal.  ?  ? ?No results found for any visits on 11/24/21. ? ?   ?Assessment & Plan:  ?  ?Routine Health Maintenance and Physical Exam ? ?Immunization History  ?Administered Date(s) Administered  ? Influenza,inj,Quad PF,6+ Mos 07/27/2021  ? Tdap 11/13/2019  ? ? ?Health Maintenance  ?Topic Date Due  ? HIV Screening  Never done  ? Hepatitis C Screening  Never done  ? Zoster Vaccines- Shingrix (1 of 2) Never done  ? INFLUENZA VACCINE  02/16/2022  ? Fecal DNA (Cologuard)  03/06/2022  ? MAMMOGRAM  01/08/2023  ? PAP SMEAR-Modifier  09/08/2024  ? TETANUS/TDAP  11/12/2029  ? HPV VACCINES  Aged Out  ? ? ?Discussed health benefits of physical activity, and encouraged her to engage in regular exercise appropriate for her age and condition. ? ?Problem List Items Addressed This Visit   ? ?  ? Cardiovascular and Mediastinum  ? Primary hypertension - Primary  ?  BP Readings from Last 3 Encounters:  ?11/24/21 103/70  ?11/04/21 120/78  ?09/08/21 121/78  ?Chronic condition well-controlled on amlodipine 5 mg daily, I discussed switching to losartan 32m daily due to pt's CKD once labs are back,  patient had cough with lisinopril in the past.  Plan discussed with patient she verbalized understanding. ?Continue amlodipine 5 mg for now ?Continue current medication DASH diet advised continue to exercise daily ?CMP ordered ?  ?  ? Relevant Orders  ? CMP14+EGFR  ? CBC with Differential  ?  ? Genitourinary  ? CKD (chronic kidney disease) stage 3, GFR 30-59 ml/min (HCC)  ?  Lab Results  ?Component Value Date  ? NA 143 07/27/2021  ? K 4.9 07/27/2021  ? CO2 25 07/27/2021  ? BUN 23 07/27/2021  ? CREATININE 1.30 (H) 07/27/2021  ? CALCIUM 9.7 07/27/2021  ?  GLUCOSE 94 07/27/2021  ?last EGFR 47.  On amlodipine 5 mg daily.  Lisinopril was discontinued in the past due to cough, I discussed starting patient on losartan 25 mg daily, once lab is back, patient verbalized understanding ?Avoid ibuprofen and other nephrotoxic agent ?Drink at least 64 ounces  of water daily ? ? ?  ?  ?  ? Other  ? Depression, major, single episode, moderate ([place])  ?  Chronic condition well-controlled on sertraline 25 mg daily ?Continue current medication ?PHQ-9 score 0 ? ?  ?  ? Relevant Medications  ? sertraline (ZOLOFT) 25 MG tablet  ? Anxiety  ?  Chronic condition well-controlled continue sertraline 25 mg daily ? ?  ?  ? Relevant Medications  ? sertraline (ZOLOFT) 25 MG tablet  ? Hyperlipidemia  ?  On atorvastatin 40 mg daily lipid panel ordered avoid fried fatty foods ? ?  ?  ? Relevant Orders  ? Lipid Profile  ? Chest pain  ?  I do no suspect cardiac origin based on history provided. ?Symptom  muscular skeletal in nature, most likely due to pulling and pushing of her 55 pound dog. ?We will monitor for now ?Take OTC  Tylenol as needed ? ? ?  ?  ? ?Other Visit Diagnoses   ? ? Screening due      ? Relevant Orders  ? TSH  ? Hepatitis C Antibody  ? HIV antibody (with reflex)  ? HgB A1c  ? ?  ? ?Return in about 5 months (around 04/26/2022) for HTN/.HLD. ? ?  ? ?Renee Rival, FNP ? ? ?

## 2021-11-24 NOTE — Assessment & Plan Note (Signed)
Chronic condition well-controlled on sertraline 25 mg daily ?Continue current medication ?PHQ-9 score 0 ?

## 2021-11-24 NOTE — Assessment & Plan Note (Signed)
I do no suspect cardiac origin based on history provided. ?Symptom  muscular skeletal in nature, most likely due to pulling and pushing of her 55 pound dog. ?We will monitor for now ?Take OTC  Tylenol as needed ? ?

## 2021-11-24 NOTE — Assessment & Plan Note (Signed)
On atorvastatin 40 mg daily lipid panel ordered avoid fried fatty foods ?

## 2021-11-24 NOTE — Assessment & Plan Note (Addendum)
Lab Results  ?Component Value Date  ? NA 143 07/27/2021  ? K 4.9 07/27/2021  ? CO2 25 07/27/2021  ? BUN 23 07/27/2021  ? CREATININE 1.30 (H) 07/27/2021  ? CALCIUM 9.7 07/27/2021  ? GLUCOSE 94 07/27/2021  ?last EGFR 47.  On amlodipine 5 mg daily.  Lisinopril was discontinued in the past due to cough, I discussed starting patient on losartan 25 mg daily, once lab is back, patient verbalized understanding ?Avoid ibuprofen and other nephrotoxic agent ?Drink at least 64 ounces of water daily ? ? ?

## 2021-11-25 ENCOUNTER — Telehealth: Payer: Self-pay

## 2021-11-25 DIAGNOSIS — E7800 Pure hypercholesterolemia, unspecified: Secondary | ICD-10-CM | POA: Diagnosis not present

## 2021-11-25 DIAGNOSIS — Z139 Encounter for screening, unspecified: Secondary | ICD-10-CM | POA: Diagnosis not present

## 2021-11-25 DIAGNOSIS — I10 Essential (primary) hypertension: Secondary | ICD-10-CM | POA: Diagnosis not present

## 2021-11-25 NOTE — Telephone Encounter (Signed)
Pt is moving from [place] to [place] on Sat 11/28/21 and needs refills on these meds.  I have called [facility] and spoke with Damon and he is working on scheduling them appointments for June for Tashika and Leonette Most.  Please send the following refills to [facility]: ?amLODipine (NORVASC) 5 MG tablet ?pantoprazole (PROTONIX) 40 MG tablet ?atorvastatin (LIPITOR) 40 MG tablet ?

## 2021-11-26 ENCOUNTER — Other Ambulatory Visit: Payer: Self-pay | Admitting: Nurse Practitioner

## 2021-11-26 ENCOUNTER — Other Ambulatory Visit: Payer: Self-pay

## 2021-11-26 DIAGNOSIS — K219 Gastro-esophageal reflux disease without esophagitis: Secondary | ICD-10-CM

## 2021-11-26 DIAGNOSIS — I10 Essential (primary) hypertension: Secondary | ICD-10-CM

## 2021-11-26 LAB — CMP14+EGFR
ALT: 21 IU/L (ref 0–32)
AST: 20 IU/L (ref 0–40)
Albumin/Globulin Ratio: 2.1 (ref 1.2–2.2)
Albumin: 4.7 g/dL (ref 3.8–4.9)
Alkaline Phosphatase: 84 IU/L (ref 44–121)
BUN/Creatinine Ratio: 13 (ref 12–28)
BUN: 17 mg/dL (ref 8–27)
Bilirubin Total: 0.4 mg/dL (ref 0.0–1.2)
CO2: 25 mmol/L (ref 20–29)
Calcium: 9.9 mg/dL (ref 8.7–10.3)
Chloride: 103 mmol/L (ref 96–106)
Creatinine, Ser: 1.27 mg/dL — ABNORMAL HIGH (ref 0.57–1.00)
Globulin, Total: 2.2 g/dL (ref 1.5–4.5)
Glucose: 94 mg/dL (ref 70–99)
Potassium: 4.8 mmol/L (ref 3.5–5.2)
Sodium: 142 mmol/L (ref 134–144)
Total Protein: 6.9 g/dL (ref 6.0–8.5)
eGFR: 48 mL/min/1.73 — ABNORMAL LOW

## 2021-11-26 LAB — CBC WITH DIFFERENTIAL/PLATELET
Basophils Absolute: 0.1 x10E3/uL (ref 0.0–0.2)
Basos: 2 %
EOS (ABSOLUTE): 0.2 x10E3/uL (ref 0.0–0.4)
Eos: 5 %
Hematocrit: 41 % (ref 34.0–46.6)
Hemoglobin: 13.9 g/dL (ref 11.1–15.9)
Immature Grans (Abs): 0 x10E3/uL (ref 0.0–0.1)
Immature Granulocytes: 0 %
Lymphocytes Absolute: 1.5 x10E3/uL (ref 0.7–3.1)
Lymphs: 37 %
MCH: 29 pg (ref 26.6–33.0)
MCHC: 33.9 g/dL (ref 31.5–35.7)
MCV: 86 fL (ref 79–97)
Monocytes Absolute: 0.3 x10E3/uL (ref 0.1–0.9)
Monocytes: 8 %
Neutrophils Absolute: 2 x10E3/uL (ref 1.4–7.0)
Neutrophils: 48 %
Platelets: 152 x10E3/uL (ref 150–450)
RBC: 4.79 x10E6/uL (ref 3.77–5.28)
RDW: 12.4 % (ref 11.7–15.4)
WBC: 4.1 x10E3/uL (ref 3.4–10.8)

## 2021-11-26 LAB — LIPID PANEL
Chol/HDL Ratio: 2.3 ratio (ref 0.0–4.4)
Cholesterol, Total: 148 mg/dL (ref 100–199)
HDL: 64 mg/dL (ref >39)
LDL Chol Calc (NIH): 66 mg/dL (ref 0–99)
Triglycerides: 100 mg/dL (ref 0–149)
VLDL Cholesterol Cal: 18 mg/dL (ref 5–40)

## 2021-11-26 LAB — TSH: TSH: 1.01 u[IU]/mL (ref 0.450–4.500)

## 2021-11-26 LAB — HIV ANTIBODY (ROUTINE TESTING W REFLEX): HIV Screen 4th Generation wRfx: NONREACTIVE

## 2021-11-26 LAB — HEPATITIS C ANTIBODY: Hep C Virus Ab: NONREACTIVE

## 2021-11-26 LAB — HEMOGLOBIN A1C
Est. average glucose Bld gHb Est-mCnc: 108 mg/dL
Hgb A1c MFr Bld: 5.4 % (ref 4.8–5.6)

## 2021-11-26 MED ORDER — LOSARTAN POTASSIUM 25 MG PO TABS: 25.0000 mg | ORAL_TABLET | Freq: Every day | ORAL | 0 refills | Status: DC

## 2021-11-26 MED ORDER — PANTOPRAZOLE SODIUM 40 MG PO TBEC: 40.0000 mg | DELAYED_RELEASE_TABLET | Freq: Two times a day (BID) | ORAL | 3 refills | Status: DC

## 2021-11-26 MED ORDER — ATORVASTATIN CALCIUM 40 MG PO TABS: 40.0000 mg | ORAL_TABLET | Freq: Every day | ORAL | 0 refills | Status: DC

## 2021-11-26 NOTE — Telephone Encounter (Signed)
Refills sent  ?Amlodipine not on patients current medication do you want me to refill this ?? I show she's taking losartan ?

## 2021-11-26 NOTE — Progress Notes (Signed)
CKD , drink at least 64 ounces of water daily, avoid ibuprofen Aleve start losartan 25 mg daily and stop amlodipine as discussed ?Order labs are normal ?

## 2021-11-26 NOTE — Progress Notes (Signed)
?  Patient should stop amlodipine 5 mg and start losartan 25 mg tablets daily follow-up in 4 weeks to recheck BP.  Patient should have labs done in 2 weeks to check her kidney function and electrolytes.  Monitor BP at home write down the numbers and bring to next visit in 4 weeks. patient should report cough, lip swelling ?Other labs are stable continue current medications

## 2021-11-27 ENCOUNTER — Other Ambulatory Visit: Payer: Self-pay | Admitting: Nurse Practitioner

## 2021-11-27 ENCOUNTER — Other Ambulatory Visit: Payer: Self-pay

## 2021-11-27 MED ORDER — AMLODIPINE BESYLATE 5 MG PO TABS: 5.0000 mg | ORAL_TABLET | Freq: Every day | ORAL | 0 refills | Status: DC

## 2021-11-27 NOTE — Telephone Encounter (Signed)
Patient aware and amlodipine sent to pharmacy ?

## 2021-11-27 NOTE — Progress Notes (Signed)
Called patient to tell her not to take losartan at this time since she is moving to a new location and she will not be able to follow  up with me at [facility], continue with amlodipine 5mg  daily and follow up with her new PCP. Pt verbalized understanding.  ?

## 2021-12-04 ENCOUNTER — Telehealth: Payer: Self-pay | Admitting: Nurse Practitioner

## 2021-12-04 NOTE — Telephone Encounter (Signed)
Rx sent to pharmacy 5/11 charles aware spoke with him

## 2021-12-04 NOTE — Telephone Encounter (Signed)
Please send refill for atorvastatin (LIPITOR) 40 MG tablet to [facility] this am

## 2021-12-18 DIAGNOSIS — F319 Bipolar disorder, unspecified: Secondary | ICD-10-CM | POA: Diagnosis not present

## 2021-12-25 DIAGNOSIS — F319 Bipolar disorder, unspecified: Secondary | ICD-10-CM | POA: Diagnosis not present

## 2022-01-01 DIAGNOSIS — F319 Bipolar disorder, unspecified: Secondary | ICD-10-CM | POA: Diagnosis not present

## 2022-01-08 DIAGNOSIS — F319 Bipolar disorder, unspecified: Secondary | ICD-10-CM | POA: Diagnosis not present

## 2022-01-15 DIAGNOSIS — F319 Bipolar disorder, unspecified: Secondary | ICD-10-CM | POA: Diagnosis not present

## 2022-01-21 DIAGNOSIS — F319 Bipolar disorder, unspecified: Secondary | ICD-10-CM | POA: Diagnosis not present

## 2022-01-26 ENCOUNTER — Encounter: Payer: Self-pay | Admitting: Nurse Practitioner

## 2022-01-26 ENCOUNTER — Ambulatory Visit: Payer: Medicaid Other | Admitting: Nurse Practitioner

## 2022-01-26 VITALS — BP 102/68 | HR 84 | Temp 97.2°F | Ht 65.0 in | Wt 145.0 lb

## 2022-01-26 DIAGNOSIS — Z803 Family history of malignant neoplasm of breast: Secondary | ICD-10-CM | POA: Diagnosis not present

## 2022-01-26 DIAGNOSIS — Z6824 Body mass index (BMI) 24.0-24.9, adult: Secondary | ICD-10-CM

## 2022-01-26 DIAGNOSIS — Z1231 Encounter for screening mammogram for malignant neoplasm of breast: Secondary | ICD-10-CM | POA: Diagnosis not present

## 2022-01-26 DIAGNOSIS — F321 Major depressive disorder, single episode, moderate: Secondary | ICD-10-CM

## 2022-01-26 DIAGNOSIS — K219 Gastro-esophageal reflux disease without esophagitis: Secondary | ICD-10-CM | POA: Diagnosis not present

## 2022-01-26 DIAGNOSIS — E559 Vitamin D deficiency, unspecified: Secondary | ICD-10-CM | POA: Diagnosis not present

## 2022-01-26 DIAGNOSIS — I10 Essential (primary) hypertension: Secondary | ICD-10-CM | POA: Diagnosis not present

## 2022-01-26 DIAGNOSIS — M1712 Unilateral primary osteoarthritis, left knee: Secondary | ICD-10-CM

## 2022-01-26 DIAGNOSIS — Z806 Family history of leukemia: Secondary | ICD-10-CM

## 2022-01-26 DIAGNOSIS — N1831 Chronic kidney disease, stage 3a: Secondary | ICD-10-CM | POA: Diagnosis not present

## 2022-01-26 DIAGNOSIS — F419 Anxiety disorder, unspecified: Secondary | ICD-10-CM | POA: Diagnosis not present

## 2022-01-26 DIAGNOSIS — Z1211 Encounter for screening for malignant neoplasm of colon: Secondary | ICD-10-CM | POA: Diagnosis not present
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
## 2022-01-26 NOTE — Patient Instructions (Addendum)
Return tomorrow for fasting labs We will call you referral appt for the orthopedic doctor and mammogram Recommend Shingles vaccine, routine eye exam, and stopping Goody Powders Cologuard ordered for August 2023 Follow-up in 50-month     Chronic Knee Pain, Adult Knee pain that lasts longer than 3 months is called chronic knee pain. You may have pain in one or both knees. Symptoms of chronic knee pain may also include swelling and stiffness. The most common cause is age-related wear and tear (osteoarthritis) of your knee joint. Many conditions can cause chronic knee pain. Treatment depends on the cause. The main treatments are physical therapy and weight loss. It may also be treated with medicines, injections, a knee sleeve or brace, and by using crutches. Rest, ice, pressure (compression), and elevation, also known as RICE therapy, may also be recommended. Follow these instructions at home: If you have a knee sleeve or brace:  Wear the knee sleeve or brace as told by your doctor. Take it off only as told by your doctor. Loosen it if your toes: Tingle. Become numb. Turn cold and blue. Keep it clean. If the sleeve or brace is not waterproof: Do not let it get wet. Ask your doctor if you may take it off when you take a bath or shower. If not, cover it with a watertight covering. Managing pain, stiffness, and swelling     If told, put heat on your knee. Do this as often as told by your doctor. Use the heat source that your doctor recommends, such as a moist heat pack or a heating pad. If you have a removable knee sleeve or brace, take it off as told by your doctor. Place a towel between your skin and the heat source. Leave the heat on for 20-30 minutes. Take off the heat if your skin turns bright red. This is very important. If you cannot feel pain, heat, or cold, you have a greater risk of getting burned. If told, put ice on your knee. To do this: If you have a removable knee sleeve or  brace, take it off as told by your doctor. Put ice in a plastic bag. Place a towel between your skin and the bag. Leave the ice on for 20 minutes, 2-3 times a day. Take off the ice if your skin turns bright red. This is very important. If you cannot feel pain, heat, or cold, you have a greater risk of damage to the area. Move your toes often. Raise the injured area above the level of your heart while you are sitting or lying down. Activity Avoid activities where both feet leave the ground at the same time (high-impact activities). Examples are running, jumping rope, and doing jumping jacks. Follow the exercise plan that your doctor makes for you. Your doctor may suggest that you: Avoid activities that make knee pain worse. You may need to change the exercises that you do, the sports that you participate in, or your job duties. Wear shoes with cushioned soles. Avoid sports that require running and sudden changes in direction. Do exercises or physical therapy. This is planned to match your needs and your abilities. Do exercises that increase your balance and strength, such as tai chi and yoga. Do not use your injured knee to support your body weight until your doctor says that you can. Use crutches as told by your doctor. Return to your normal activities when your doctor says that it is safe. General instructions Take over-the-counter and prescription medicines  only as told by your doctor. If you are overweight, work with your doctor and a food expert (dietitian) to set goals to lose weight. Being overweight can make your knee hurt more. Do not smoke or use any products that contain nicotine or tobacco. If you need help quitting, ask your doctor. Keep all follow-up visits. Contact a doctor if: You have knee pain that is not getting better or gets worse. You are not able to do your exercises due to knee pain. Get help right away if: Your knee swells and the swelling gets worse. You cannot  move your knee. You have very bad knee pain. Summary Knee pain that lasts more than 3 months is called chronic knee pain. The main treatments for chronic knee pain are physical therapy and weight loss. You may also need to take medicines, wear a knee sleeve or brace, use crutches, and put ice or heat on your knee. Lose weight if you are overweight. Work with your doctor and a food expert (dietitian) to help you set goals to lose weight. Being overweight can make your knee hurt more. Follow the exercise plan that your doctor makes for you. This information is not intended to replace advice given to you by your health care provider. Make sure you discuss any questions you have with your health care provider. Document Revised: 12/19/2019 Document Reviewed: 12/19/2019 Elsevier Patient Education  [place].

## 2022-01-27 ENCOUNTER — Other Ambulatory Visit: Payer: Medicaid Other

## 2022-01-27 DIAGNOSIS — I10 Essential (primary) hypertension: Secondary | ICD-10-CM

## 2022-01-27 DIAGNOSIS — E559 Vitamin D deficiency, unspecified: Secondary | ICD-10-CM

## 2022-01-28 LAB — COMPREHENSIVE METABOLIC PANEL
ALT: 30 IU/L (ref 0–32)
AST: 24 IU/L (ref 0–40)
Albumin/Globulin Ratio: 1.8 (ref 1.2–2.2)
Albumin: 4.4 g/dL (ref 3.8–4.9)
Alkaline Phosphatase: 85 IU/L (ref 44–121)
BUN/Creatinine Ratio: 15 (ref 12–28)
BUN: 18 mg/dL (ref 8–27)
Bilirubin Total: 0.3 mg/dL (ref 0.0–1.2)
CO2: 24 mmol/L (ref 20–29)
Calcium: 9.4 mg/dL (ref 8.7–10.3)
Chloride: 102 mmol/L (ref 96–106)
Creatinine, Ser: 1.23 mg/dL — ABNORMAL HIGH (ref 0.57–1.00)
Globulin, Total: 2.4 g/dL (ref 1.5–4.5)
Glucose: 95 mg/dL (ref 70–99)
Potassium: 4.8 mmol/L (ref 3.5–5.2)
Sodium: 141 mmol/L (ref 134–144)
Total Protein: 6.8 g/dL (ref 6.0–8.5)
eGFR: 50 mL/min/{1.73_m2} — ABNORMAL LOW (ref >59)

## 2022-01-28 LAB — CARDIOVASCULAR RISK ASSESSMENT

## 2022-01-28 LAB — LIPID PANEL
Chol/HDL Ratio: 2 ratio (ref 0.0–4.4)
Cholesterol, Total: 149 mg/dL (ref 100–199)
HDL: 74 mg/dL (ref >39)
LDL Chol Calc (NIH): 61 mg/dL (ref 0–99)
Triglycerides: 74 mg/dL (ref 0–149)
VLDL Cholesterol Cal: 14 mg/dL (ref 5–40)

## 2022-01-28 LAB — CBC WITH DIFFERENTIAL/PLATELET
Basophils Absolute: 0.1 10*3/uL (ref 0.0–0.2)
Basos: 2 %
EOS (ABSOLUTE): 0.2 10*3/uL (ref 0.0–0.4)
Eos: 4 %
Hematocrit: 41 % (ref 34.0–46.6)
Hemoglobin: 13.3 g/dL (ref 11.1–15.9)
Immature Grans (Abs): 0 10*3/uL (ref 0.0–0.1)
Immature Granulocytes: 0 %
Lymphocytes Absolute: 1.6 10*3/uL (ref 0.7–3.1)
Lymphs: 35 %
MCH: 28.6 pg (ref 26.6–33.0)
MCHC: 32.4 g/dL (ref 31.5–35.7)
MCV: 88 fL (ref 79–97)
Monocytes Absolute: 0.3 10*3/uL (ref 0.1–0.9)
Monocytes: 6 %
Neutrophils Absolute: 2.4 10*3/uL (ref 1.4–7.0)
Neutrophils: 53 %
Platelets: 144 10*3/uL — ABNORMAL LOW (ref 150–450)
RBC: 4.65 x10E6/uL (ref 3.77–5.28)
RDW: 12.7 % (ref 11.7–15.4)
WBC: 4.5 10*3/uL (ref 3.4–10.8)

## 2022-01-28 LAB — VITAMIN D 25 HYDROXY (VIT D DEFICIENCY, FRACTURES): Vit D, 25-Hydroxy: 77.8 ng/mL (ref 30.0–100.0)

## 2022-01-28 LAB — TSH: TSH: 1.13 u[IU]/mL (ref 0.450–4.500)

## 2022-01-29 ENCOUNTER — Telehealth: Payer: Self-pay

## 2022-01-29 NOTE — Telephone Encounter (Signed)
Patient's husband Leonette Most calling for update of patient's lab results. Made him aware they need to be reviewed by provider, when this is done they will receive a call. He VU.   Lorita Officer, CCMA 01/29/22 10:45 AM

## 2022-02-01 DIAGNOSIS — F321 Major depressive disorder, single episode, moderate: Secondary | ICD-10-CM | POA: Diagnosis not present

## 2022-02-01 DIAGNOSIS — M25562 Pain in left knee: Secondary | ICD-10-CM | POA: Diagnosis not present

## 2022-02-01 DIAGNOSIS — F411 Generalized anxiety disorder: Secondary | ICD-10-CM | POA: Diagnosis not present

## 2022-02-01 DIAGNOSIS — G8929 Other chronic pain: Secondary | ICD-10-CM | POA: Diagnosis not present

## 2022-02-04 DIAGNOSIS — F319 Bipolar disorder, unspecified: Secondary | ICD-10-CM | POA: Diagnosis not present

## 2022-02-10 DIAGNOSIS — F319 Bipolar disorder, unspecified: Secondary | ICD-10-CM | POA: Diagnosis not present

## 2022-02-15 ENCOUNTER — Ambulatory Visit
Admission: RE | Admit: 2022-02-15 | Discharge: 2022-02-15 | Disposition: A | Discharge status: Home / Self Care | Payer: Medicaid Other | Source: Ambulatory Visit | Attending: Nurse Practitioner | Admitting: Nurse Practitioner

## 2022-02-15 DIAGNOSIS — Z1231 Encounter for screening mammogram for malignant neoplasm of breast: Secondary | ICD-10-CM | POA: Diagnosis not present

## 2022-02-17 DIAGNOSIS — F319 Bipolar disorder, unspecified: Secondary | ICD-10-CM | POA: Diagnosis not present

## 2022-03-03 DIAGNOSIS — F319 Bipolar disorder, unspecified: Secondary | ICD-10-CM | POA: Diagnosis not present

## 2022-03-08 ENCOUNTER — Other Ambulatory Visit: Payer: Self-pay | Admitting: Nurse Practitioner

## 2022-03-08 MED ORDER — AMLODIPINE BESYLATE 5 MG PO TABS: 5.0000 mg | ORAL_TABLET | Freq: Every day | ORAL | 0 refills | Status: DC

## 2022-03-12 DIAGNOSIS — F319 Bipolar disorder, unspecified: Secondary | ICD-10-CM | POA: Diagnosis not present

## 2022-03-15 DIAGNOSIS — Z1211 Encounter for screening for malignant neoplasm of colon: Secondary | ICD-10-CM | POA: Diagnosis not present

## 2022-03-22 DIAGNOSIS — K047 Periapical abscess without sinus: Secondary | ICD-10-CM | POA: Diagnosis not present

## 2022-03-22 DIAGNOSIS — R220 Localized swelling, mass and lump, head: Secondary | ICD-10-CM | POA: Diagnosis not present

## 2022-03-22 DIAGNOSIS — J019 Acute sinusitis, unspecified: Secondary | ICD-10-CM | POA: Diagnosis not present

## 2022-03-22 DIAGNOSIS — B9689 Other specified bacterial agents as the cause of diseases classified elsewhere: Secondary | ICD-10-CM | POA: Diagnosis not present

## 2022-03-23 LAB — COLOGUARD: COLOGUARD: NEGATIVE

## 2022-04-01 ENCOUNTER — Ambulatory Visit: Payer: Medicaid Other | Admitting: Nurse Practitioner

## 2022-04-01 ENCOUNTER — Encounter: Payer: Self-pay | Admitting: Nurse Practitioner

## 2022-04-01 VITALS — BP 98/64 | HR 87 | Temp 97.0°F | Ht 65.5 in | Wt 147.0 lb

## 2022-04-01 DIAGNOSIS — F410 Panic disorder [episodic paroxysmal anxiety] without agoraphobia: Secondary | ICD-10-CM | POA: Diagnosis not present

## 2022-04-01 MED ORDER — SERTRALINE HCL 50 MG PO TABS: 50.0000 mg | ORAL_TABLET | Freq: Every day | ORAL | 3 refills | Status: DC
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
## 2022-04-01 NOTE — Patient Instructions (Signed)
Increase Zoloft to 50 mg daily Recommend counseling Follow-up in 4 weeks   Managing Anxiety, Adult After being diagnosed with anxiety, you may be relieved to know why you have felt or behaved a certain way. You may also feel overwhelmed about the treatment ahead and what it will mean for your life. With care and support, you can manage this condition. How to manage lifestyle changes Managing stress and anxiety  Stress is your body's reaction to life changes and events, both good and bad. Most stress will last just a few hours, but stress can be ongoing and can lead to more than just stress. Although stress can play a major role in anxiety, it is not the same as anxiety. Stress is usually caused by something external, such as a deadline, test, or competition. Stress normally passes after the triggering event has ended.  Anxiety is caused by something internal, such as imagining a terrible outcome or worrying that something will go wrong that will devastate you. Anxiety often does not go away even after the triggering event is over, and it can become long-term (chronic) worry. It is important to understand the differences between stress and anxiety and to manage your stress effectively so that it does not lead to an anxious response. Talk with your health care provider or a counselor to learn more about reducing anxiety and stress. He or she may suggest tension reduction techniques, such as: Music therapy. Spend time creating or listening to music that you enjoy and that inspires you. Mindfulness-based meditation. Practice being aware of your normal breaths while not trying to control your breathing. It can be done while sitting or walking. Centering prayer. This involves focusing on a word, phrase, or sacred image that means something to you and brings you peace. Deep breathing. To do this, expand your stomach and inhale slowly through your nose. Hold your breath for 3-5 seconds. Then exhale slowly,  letting your stomach muscles relax. Self-talk. Learn to notice and identify thought patterns that lead to anxiety reactions and change those patterns to thoughts that feel peaceful. Muscle relaxation. Taking time to tense muscles and then relax them. Choose a tension reduction technique that fits your lifestyle and personality. These techniques take time and practice. Set aside 5-15 minutes a day to do them. Therapists can offer counseling and training in these techniques. The training to help with anxiety may be covered by some insurance plans. Other things you can do to manage stress and anxiety include: Keeping a stress diary. This can help you learn what triggers your reaction and then learn ways to manage your response. Thinking about how you react to certain situations. You may not be able to control everything, but you can control your response. Making time for activities that help you relax and not feeling guilty about spending your time in this way. Doing visual imagery. This involves imagining or creating mental pictures to help you relax. Practicing yoga. Through yoga poses, you can lower tension and promote relaxation.  Medicines Medicines can help ease symptoms. Medicines for anxiety include: Antidepressant medicines. These are usually prescribed for long-term daily control. Anti-anxiety medicines. These may be added in severe cases, especially when panic attacks occur. Medicines will be prescribed by a health care provider. When used together, medicines, psychotherapy, and tension reduction techniques may be the most effective treatment. Relationships Relationships can play a big part in helping you recover. Try to spend more time connecting with trusted friends and family members. Consider going to  couples counseling if you have a partner, taking family education classes, or going to family therapy. Therapy can help you and others better understand your condition. How to recognize  changes in your anxiety Everyone responds differently to treatment for anxiety. Recovery from anxiety happens when symptoms decrease and stop interfering with your daily activities at home or work. This may mean that you will start to: Have better concentration and focus. Worry will interfere less in your daily thinking. Sleep better. Be less irritable. Have more energy. Have improved memory. It is also important to recognize when your condition is getting worse. Contact your health care provider if your symptoms interfere with home or work and you feel like your condition is not improving. Follow these instructions at home: Activity Exercise. Adults should do the following: Exercise for at least 150 minutes each week. The exercise should increase your heart rate and make you sweat (moderate-intensity exercise). Strengthening exercises at least twice a week. Get the right amount and quality of sleep. Most adults need 7-9 hours of sleep each night. Lifestyle  Eat a healthy diet that includes plenty of vegetables, fruits, whole grains, low-fat dairy products, and lean protein. Do not eat a lot of foods that are high in fats, added sugars, or salt (sodium). Make choices that simplify your life. Do not use any products that contain nicotine or tobacco. These products include cigarettes, chewing tobacco, and vaping devices, such as e-cigarettes. If you need help quitting, ask your health care provider. Avoid caffeine, alcohol, and certain over-the-counter cold medicines. These may make you feel worse. Ask your pharmacist which medicines to avoid. General instructions Take over-the-counter and prescription medicines only as told by your health care provider. Keep all follow-up visits. This is important. Where to find support You can get help and support from these sources: Self-help groups. Online and [employer]. A trusted spiritual leader. Couples counseling. Family education  classes. Family therapy. Where to find more information You may find that joining a support group helps you deal with your anxiety. The following sources can help you locate counselors or support groups near you: Mental Health America: www.mentalhealthamerica.net Anxiety and Depression Association of [place] (ADAA): [employer]de [employer] Mental Illness (NAMI): www.nami.org Contact a health care provider if: You have a hard time staying focused or finishing daily tasks. You spend many hours a day feeling worried about everyday life. You become exhausted by worry. You start to have headaches or frequently feel tense. You develop chronic nausea or diarrhea. Get help right away if: You have a racing heart and shortness of breath. You have thoughts of hurting yourself or others. If you ever feel like you may hurt yourself or others, or have thoughts about taking your own life, get help right away. Go to your nearest emergency department or: Call your local emergency services (911 in the [place] Call a suicide crisis helpline, such as the National Suicide Prevention Lifeline at 9857157416 or 988 in the [place] This is open 24 hours a day in the [place] Text the Crisis Text Line at (573)166-1941 (in the [place] Summary Taking steps to learn and use tension reduction techniques can help calm you and help prevent triggering an anxiety reaction. When used together, medicines, psychotherapy, and tension reduction techniques may be the most effective treatment. Family, friends, and partners can play a big part in supporting you. This information is not intended to replace advice given to you by your health care provider. Make sure you discuss any questions  you have with your health care provider. Document Revised: 01/28/2021 Document Reviewed: 10/26/2020 Elsevier Patient Education  2023 [employer].

## 2022-04-08 ENCOUNTER — Encounter: Payer: Self-pay | Admitting: Nurse Practitioner

## 2022-04-08 ENCOUNTER — Ambulatory Visit: Payer: Medicaid Other | Admitting: Nurse Practitioner

## 2022-04-08 VITALS — BP 116/64 | HR 96 | Temp 97.2°F | Ht 65.5 in | Wt 143.0 lb

## 2022-04-08 DIAGNOSIS — F419 Anxiety disorder, unspecified: Secondary | ICD-10-CM

## 2022-04-08 DIAGNOSIS — F319 Bipolar disorder, unspecified: Secondary | ICD-10-CM | POA: Diagnosis not present

## 2022-04-08 DIAGNOSIS — R634 Abnormal weight loss: Secondary | ICD-10-CM | POA: Diagnosis not present

## 2022-04-08 DIAGNOSIS — R112 Nausea with vomiting, unspecified: Secondary | ICD-10-CM

## 2022-04-08 DIAGNOSIS — K219 Gastro-esophageal reflux disease without esophagitis: Secondary | ICD-10-CM

## 2022-04-08 DIAGNOSIS — N3000 Acute cystitis without hematuria: Secondary | ICD-10-CM

## 2022-04-08 LAB — POCT URINALYSIS DIP (CLINITEK)
Blood, UA: NEGATIVE
Glucose, UA: NEGATIVE mg/dL
Nitrite, UA: POSITIVE — AB
POC PROTEIN,UA: 30 — AB
Spec Grav, UA: 1.025 (ref 1.010–1.025)
Urobilinogen, UA: 1 E.U./dL
pH, UA: 5 (ref 5.0–8.0)

## 2022-04-08 MED ORDER — ONDANSETRON HCL 4 MG PO TABS: 4.0000 mg | ORAL_TABLET | Freq: Three times a day (TID) | ORAL | 3 refills | Status: DC | PRN

## 2022-04-08 MED ORDER — NITROFURANTOIN MONOHYD MACRO 100 MG PO CAPS: 100.0000 mg | ORAL_CAPSULE | Freq: Two times a day (BID) | ORAL | 0 refills | Status: DC

## 2022-04-08 MED ORDER — SERTRALINE HCL 25 MG PO TABS: 25.0000 mg | ORAL_TABLET | Freq: Every day | ORAL | 3 refills | Status: DC

## 2022-04-08 NOTE — Patient Instructions (Addendum)
Stop Goody Powders Eat small frequent meals Take Zofran as needed for nausea Take Zoloft 25 mg daily Recommend counseling Follow-up as needed  Managing Anxiety, Adult After being diagnosed with anxiety, you may be relieved to know why you have felt or behaved a certain way. You may also feel overwhelmed about the treatment ahead and what it will mean for your life. With care and support, you can manage this condition. How to manage lifestyle changes Managing stress and anxiety  Stress is your body's reaction to life changes and events, both good and bad. Most stress will last just a few hours, but stress can be ongoing and can lead to more than just stress. Although stress can play a major role in anxiety, it is not the same as anxiety. Stress is usually caused by something external, such as a deadline, test, or competition. Stress normally passes after the triggering event has ended.  Anxiety is caused by something internal, such as imagining a terrible outcome or worrying that something will go wrong that will devastate you. Anxiety often does not go away even after the triggering event is over, and it can become long-term (chronic) worry. It is important to understand the differences between stress and anxiety and to manage your stress effectively so that it does not lead to an anxious response. Talk with your health care provider or a counselor to learn more about reducing anxiety and stress. He or she may suggest tension reduction techniques, such as: Music therapy. Spend time creating or listening to music that you enjoy and that inspires you. Mindfulness-based meditation. Practice being aware of your normal breaths while not trying to control your breathing. It can be done while sitting or walking. Centering prayer. This involves focusing on a word, phrase, or sacred image that means something to you and brings you peace. Deep breathing. To do this, expand your stomach and inhale slowly  through your nose. Hold your breath for 3-5 seconds. Then exhale slowly, letting your stomach muscles relax. Self-talk. Learn to notice and identify thought patterns that lead to anxiety reactions and change those patterns to thoughts that feel peaceful. Muscle relaxation. Taking time to tense muscles and then relax them. Choose a tension reduction technique that fits your lifestyle and personality. These techniques take time and practice. Set aside 5-15 minutes a day to do them. Therapists can offer counseling and training in these techniques. The training to help with anxiety may be covered by some insurance plans. Other things you can do to manage stress and anxiety include: Keeping a stress diary. This can help you learn what triggers your reaction and then learn ways to manage your response. Thinking about how you react to certain situations. You may not be able to control everything, but you can control your response. Making time for activities that help you relax and not feeling guilty about spending your time in this way. Doing visual imagery. This involves imagining or creating mental pictures to help you relax. Practicing yoga. Through yoga poses, you can lower tension and promote relaxation.  Medicines Medicines can help ease symptoms. Medicines for anxiety include: Antidepressant medicines. These are usually prescribed for long-term daily control. Anti-anxiety medicines. These may be added in severe cases, especially when panic attacks occur. Medicines will be prescribed by a health care provider. When used together, medicines, psychotherapy, and tension reduction techniques may be the most effective treatment. Relationships Relationships can play a big part in helping you recover. Try to spend more time  connecting with trusted friends and family members. Consider going to couples counseling if you have a partner, taking family education classes, or going to family therapy. Therapy can  help you and others better understand your condition. How to recognize changes in your anxiety Everyone responds differently to treatment for anxiety. Recovery from anxiety happens when symptoms decrease and stop interfering with your daily activities at home or work. This may mean that you will start to: Have better concentration and focus. Worry will interfere less in your daily thinking. Sleep better. Be less irritable. Have more energy. Have improved memory. It is also important to recognize when your condition is getting worse. Contact your health care provider if your symptoms interfere with home or work and you feel like your condition is not improving. Follow these instructions at home: Activity Exercise. Adults should do the following: Exercise for at least 150 minutes each week. The exercise should increase your heart rate and make you sweat (moderate-intensity exercise). Strengthening exercises at least twice a week. Get the right amount and quality of sleep. Most adults need 7-9 hours of sleep each night. Lifestyle  Eat a healthy diet that includes plenty of vegetables, fruits, whole grains, low-fat dairy products, and lean protein. Do not eat a lot of foods that are high in fats, added sugars, or salt (sodium). Make choices that simplify your life. Do not use any products that contain nicotine or tobacco. These products include cigarettes, chewing tobacco, and vaping devices, such as e-cigarettes. If you need help quitting, ask your health care provider. Avoid caffeine, alcohol, and certain over-the-counter cold medicines. These may make you feel worse. Ask your pharmacist which medicines to avoid. General instructions Take over-the-counter and prescription medicines only as told by your health care provider. Keep all follow-up visits. This is important. Where to find support You can get help and support from these sources: Self-help groups. Online and [employer] A trusted spiritual leader. Couples counseling. Family education classes. Family therapy. Where to find more information You may find that joining a support group helps you deal with your anxiety. The following sources can help you locate counselors or support groups near you: Mental Health America: www.mentalhealthamerica.net Anxiety and Depression Association of [place] (ADAA): [employer] Mental Illness (NAMI): www.nami.org Contact a health care provider if: You have a hard time staying focused or finishing daily tasks. You spend many hours a day feeling worried about everyday life. You become exhausted by worry. You start to have headaches or frequently feel tense. You develop chronic nausea or diarrhea. Get help right away if: You have a racing heart and shortness of breath. You have thoughts of hurting yourself or others. If you ever feel like you may hurt yourself or others, or have thoughts about taking your own life, get help right away. Go to your nearest emergency department or: Call your local emergency services (911 in the [place] Call a suicide crisis helpline, such as the National Suicide Prevention Lifeline at (713)798-3178 or 988 in the [place] This is open 24 hours a day in the [place] Text the Crisis Text Line at 587-656-9546 (in the [place] Summary Taking steps to learn and use tension reduction techniques can help calm you and help prevent triggering an anxiety reaction. When used together, medicines, psychotherapy, and tension reduction techniques may be the most effective treatment. Family, friends, and partners can play a big part in supporting you. This information is not intended to replace advice given to you by  your health care provider. Make sure you discuss any questions you have with your health care provider. Document Revised: 01/28/2021 Document Reviewed: 10/26/2020 Elsevier Patient Education  2023 [employer]   Food Choices  for Gastroesophageal Reflux Disease, Adult When you have gastroesophageal reflux disease (GERD), the foods you eat and your eating habits are very important. Choosing the right foods can help ease your discomfort. Think about working with a food expert (dietitian) to help you make good choices. What are tips for following this plan? Reading food labels Look for foods that are low in saturated fat. Foods that may help with your symptoms include: Foods that have less than 5% of daily value (DV) of fat. Foods that have 0 grams of trans fat. Cooking Do not fry your food. Cook your food by baking, steaming, grilling, or broiling. These are all methods that do not need a lot of fat for cooking. To add flavor, try to use herbs that are low in spice and acidity. Meal planning  Choose healthy foods that are low in fat, such as: Fruits and vegetables. Whole grains. Low-fat dairy products. Lean meats, fish, and poultry. Eat small meals often instead of eating 3 large meals each day. Eat your meals slowly in a place where you are relaxed. Avoid bending over or lying down until 2-3 hours after eating. Limit high-fat foods such as fatty meats or fried foods. Limit your intake of fatty foods, such as oils, butter, and shortening. Avoid the following as told by your doctor: Foods that cause symptoms. These may be different for different people. Keep a food diary to keep track of foods that cause symptoms. Alcohol. Drinking a lot of liquid with meals. Eating meals during the 2-3 hours before bed. Lifestyle Stay at a healthy weight. Ask your doctor what weight is healthy for you. If you need to lose weight, work with your doctor to do so safely. Exercise for at least 30 minutes on 5 or more days each week, or as told by your doctor. Wear loose-fitting clothes. Do not smoke or use any products that contain nicotine or tobacco. If you need help quitting, ask your doctor. Sleep with the head of your bed  higher than your feet. Use a wedge under the mattress or blocks under the bed frame to raise the head of the bed. Chew sugar-free gum after meals. What foods should eat?  Eat a healthy, well-balanced diet of fruits, vegetables, whole grains, low-fat dairy products, lean meats, fish, and poultry. Each person is different. Foods that may cause symptoms in one person may not cause any symptoms in another person. Work with your doctor to find foods that are safe for you. The items listed above may not be a complete list of what you can eat and drink. Contact a food expert for more options. What foods should I avoid? Limiting some of these foods may help in managing the symptoms of GERD. Everyone is different. Talk with a food expert or your doctor to help you find the exact foods to avoid, if any. Fruits Any fruits prepared with added fat. Any fruits that cause symptoms. For some people, this may include citrus fruits, such as oranges, grapefruit, pineapple, and lemons. Vegetables Deep-fried vegetables. [place] fries. Any vegetables prepared with added fat. Any vegetables that cause symptoms. For some people, this may include tomatoes and tomato products, chili peppers, onions and garlic, and horseradish. Grains Pastries or quick breads with added fat. Meats and other proteins High-fat meats,  such as fatty beef or pork, hot dogs, ribs, ham, sausage, salami, and bacon. Fried meat or protein, including fried fish and fried chicken. Nuts and nut butters, in large amounts. Dairy Whole milk and chocolate milk. Sour cream. Cream. Ice cream. Cream cheese. Milkshakes. Fats and oils Butter. Margarine. Shortening. Ghee. Beverages Coffee and tea, with or without caffeine. Carbonated beverages. Sodas. Energy drinks. Fruit juice made with acidic fruits, such as orange or grapefruit. Tomato juice. Alcoholic drinks. Sweets and desserts Chocolate and cocoa. Donuts. Seasonings and condiments Pepper. Peppermint  and spearmint. Added salt. Any condiments, herbs, or seasonings that cause symptoms. For some people, this may include curry, hot sauce, or vinegar-based salad dressings. The items listed above may not be a complete list of what you should not eat and drink. Contact a food expert for more options. Questions to ask your doctor Diet and lifestyle changes are often the first steps that are taken to manage symptoms of GERD. If diet and lifestyle changes do not help, talk with your doctor about taking medicines. Where to find more information International Foundation for Gastrointestinal Disorders: aboutgerd.org Summary When you have GERD, food and lifestyle choices are very important in easing your symptoms. Eat small meals often instead of 3 large meals a day. Eat your meals slowly and in a place where you are relaxed. Avoid bending over or lying down until 2-3 hours after eating. Limit high-fat foods such as fatty meats or fried foods. This information is not intended to replace advice given to you by your health care provider. Make sure you discuss any questions you have with your health care provider. Document Revised: 01/14/2020 Document Reviewed: 01/14/2020 Elsevier Patient Education  2023 [employer].
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
## 2022-04-13 ENCOUNTER — Other Ambulatory Visit: Payer: Self-pay | Admitting: Nurse Practitioner

## 2022-04-13 DIAGNOSIS — K089 Disorder of teeth and supporting structures, unspecified: Secondary | ICD-10-CM

## 2022-04-13 LAB — URINE CULTURE

## 2022-04-15 ENCOUNTER — Other Ambulatory Visit: Payer: Self-pay | Admitting: Nurse Practitioner

## 2022-04-15 DIAGNOSIS — K219 Gastro-esophageal reflux disease without esophagitis: Secondary | ICD-10-CM

## 2022-04-29 ENCOUNTER — Ambulatory Visit: Payer: Medicaid Other | Admitting: Nurse Practitioner

## 2022-05-03 ENCOUNTER — Ambulatory Visit: Payer: Medicaid Other | Admitting: Nurse Practitioner

## 2022-05-03 ENCOUNTER — Encounter: Payer: Self-pay | Admitting: Nurse Practitioner

## 2022-05-03 VITALS — BP 132/70 | HR 76 | Temp 97.1°F | Ht 65.5 in | Wt 144.0 lb

## 2022-05-03 DIAGNOSIS — Z23 Encounter for immunization: Secondary | ICD-10-CM

## 2022-05-03 DIAGNOSIS — F410 Panic disorder [episodic paroxysmal anxiety] without agoraphobia: Secondary | ICD-10-CM

## 2022-05-03 DIAGNOSIS — I10 Essential (primary) hypertension: Secondary | ICD-10-CM | POA: Diagnosis not present

## 2022-05-03 DIAGNOSIS — K219 Gastro-esophageal reflux disease without esophagitis: Secondary | ICD-10-CM | POA: Diagnosis not present

## 2022-05-03 NOTE — Progress Notes (Signed)
Subjective:  Patient ID: Kathy Perry, female    DOB: Aug 27, 1960  Age: 60 y.o. MRN: [id number]  CC: HTN GERD  HPI   Kathy Perry presents to establish care. She has chronic medical problems of GERD, HTN, anxiety. Sister-in-law has leukemia, spouse recently diagnosed with colon cancer, daughter has breast cancer. Increased stress. Pt sees psychiatry (dr Ky Barban) every 3 months,see's a therapist also at [facility]. Currently prescribed Zoloft 25 mg QD. States her youngest daughter is drug addict, see's a therapist, Ferdinand Lango.  Hypertension, follow-up: She was last seen for hypertension 3 months ago.  BP at that visit was 102/68. Management includes Norvasc.  She reports excellent compliance with treatment. She is not having side effects.  She is following a Regular diet. She is not exercising. She does not smoke.  Use of agents associated with hypertension: NSAIDS.    Pertinent labs: Lab Results  Component Value Date   CHOL 149 01/27/2022   HDL 74 01/27/2022   LDLCALC 61 01/27/2022   TRIG 74 01/27/2022   CHOLHDL 2.0 01/27/2022   Lab Results  Component Value Date   NA 141 01/27/2022   K 4.8 01/27/2022   CREATININE 1.23 (H) 01/27/2022   EGFR 50 (L) 01/27/2022   GFRNONAA 56 (L) 04/18/2020   GLUCOSE 95 01/27/2022     GERD, Follow up:  Current treatment consist of: Protonix         She reports excellent compliance with treatment. She is not having side effects. .  Lipid/Cholesterol, Follow-up  Last lipid panel Other pertinent labs  Lab Results  Component Value Date   CHOL 149 01/27/2022   HDL 74 01/27/2022   LDLCALC 61 01/27/2022   TRIG 74 01/27/2022   CHOLHDL 2.0 01/27/2022   Lab Results  Component Value Date   ALT 30 01/27/2022   AST 24 01/27/2022   PLT 144 (L) 01/27/2022   TSH 1.130 01/27/2022     Management includes lipitor. She reports excellent compliance with treatment. She is not having side effects.   Current diet: in general, a "healthy" diet      Depression, Follow-up  Current treatment includes Sertraline and see's a Therapist in [place].  She reports excellent compliance with treatment. She is not having side effects.  She reports good tolerance of treatment. Current symptoms include: difficulty concentrating She feels she is Unchanged since last visit. Patient is concerned about her husband upcoming colonoscopy and endoscopy confirming he may have colon CA. Also concerned that daughters biopsy may confirm a dx of breast cancer again (has already had a double mastectomy.)     05/03/2022   11:02 AM 01/26/2022    3:22 PM 01/26/2022    3:07 PM  Depression screen PHQ 2/9  Decreased Interest 1 1 0  Down, Depressed, Hopeless 1 1 0  PHQ - 2 Score 2 2 0  Altered sleeping 1 1   Tired, decreased energy 3 3   Change in appetite 0 0   Feeling bad or failure about yourself  0 0   Trouble concentrating 0 0   Moving slowly or fidgety/restless 0 0   Suicidal thoughts 0 0   PHQ-9 Score 6 6   Difficult doing work/chores Not difficult at all Somewhat difficult        Current Outpatient Medications on File Prior to Visit  Medication Sig Dispense Refill   amLODipine (NORVASC) 5 MG tablet Take 1 tablet (5 mg total) by mouth daily. 90 tablet  0   Aspirin-Acetaminophen-Caffeine (GOODYS EXTRA STRENGTH) 500-325-65 MG PACK Take 1 Package by mouth daily as needed (pain).     atorvastatin (LIPITOR) 40 MG tablet Take 1 tablet (40 mg total) by mouth daily. 90 tablet 0   nitrofurantoin, macrocrystal-monohydrate, (MACROBID) 100 MG capsule Take 1 capsule (100 mg total) by mouth 2 (two) times daily. 14 capsule 0   ondansetron (ZOFRAN) 4 MG tablet Take 1 tablet (4 mg total) by mouth every 8 (eight) hours as needed for nausea or vomiting. 30 tablet 3   pantoprazole (PROTONIX) 40 MG tablet TAKE ONE TABLET BY MOUTH TWICE DAILY before a meal 180 tablet 0   sertraline (ZOLOFT) 25 MG tablet Take 1 tablet  (25 mg total) by mouth daily. 30 tablet 3   VITAMIN D, ERGOCALCIFEROL, PO Take 125 mcg by mouth daily.     No current facility-administered medications on file prior to visit.   Past Medical History:  Diagnosis Date   Anxiety    Arthritis    GERD (gastroesophageal reflux disease)    IBS (irritable bowel syndrome)    Need for Tdap vaccination 11/13/2019   Past Surgical History:  Procedure Laterality Date   BALLOON DILATION N/A 06/23/2021   Procedure: BALLOON DILATION;  Surgeon: Eloise Harman, DO;  [facility];  Service: Endoscopy;  Laterality: N/A;   BIOPSY  06/23/2021   Procedure: BIOPSY;  Surgeon: Eloise Harman, DO;  [facility];  Service: Endoscopy;;   BREAST EXCISIONAL BIOPSY     BREAST LUMPECTOMY WITH RADIOACTIVE SEED LOCALIZATION Left 02/28/2020   Procedure: LEFT BREAST LUMPECTOMY WITH RADIOACTIVE SEED LOCALIZATION;  Surgeon: Donnie Mesa, MD;  Location: [place];  Service: General;  Laterality: Left;  LMA   ESOPHAGOGASTRODUODENOSCOPY (EGD) WITH PROPOFOL N/A 06/23/2021   Procedure: ESOPHAGOGASTRODUODENOSCOPY (EGD) WITH PROPOFOL;  Surgeon: Eloise Harman, DO;  [facility];  Service: Endoscopy;  Laterality: N/A;   TUBAL LIGATION     WISDOM TOOTH EXTRACTION      Family History  Problem Relation Age of Onset   Breast cancer Paternal Grandmother    Diabetes Father    Cancer Father        not sure primary   Memory loss Mother    Cancer Brother        thyroid   COPD Brother    Breast cancer Daughter    Breast cancer Paternal Aunt    Lung cancer Paternal Aunt    Colon cancer Neg Hx    Esophageal cancer Neg Hx    Gastric cancer Neg Hx    Social History   Socioeconomic History   Marital status: Married    Spouse name: Psychologist, prison and probation services   Number of children: 3   Years of education: Not on file   Highest education level: 9th grade  Occupational History   Occupation: homemaker  Tobacco Use   Smoking status: Never    Smokeless tobacco: Never  Vaping Use   Vaping Use: Never used  Substance and Sexual Activity   Alcohol use: No   Drug use: Never   Sexual activity: Not Currently    Birth control/protection: Surgical, Post-menopausal    Comment: tubal  Other Topics Concern   Not on file  Social History Narrative   Lives with [place] [address] 41 years   1 son, 2 daughters   4 grandchildren       Cat: Lucy   Dog: Roofus       Enjoys: walking  with dog, flowers, sewing, iron       Diet: eats all food groups-limited meat    Caffeine: some coke    Water: 2-4 cups of water      Wears seat belt   Smoke detectors at home    Social Determinants of Health   Financial Resource Strain: Low Risk  (09/08/2021)   Overall Financial Resource Strain (CARDIA)    Difficulty of Paying Living Expenses: Not very hard  Food Insecurity: No Food Insecurity (09/08/2021)   Hunger Vital Sign    Worried About Running Out of Food in the Last Year: Never true    Ran Out of Food in the Last Year: Never true  Transportation Needs: No Transportation Needs (09/08/2021)   PRAPARE - Hydrologist (Medical): No    Lack of Transportation (Non-Medical): No  Physical Activity: Insufficiently Active (09/08/2021)   Exercise Vital Sign    Days of Exercise per Week: 7 days    Minutes of Exercise per Session: 20 min  Stress: Stress Concern Present (09/08/2021)   [place]    Feeling of Stress : To some extent  Social Connections: Moderately Isolated (09/08/2021)   Social Connection and Isolation Panel [NHANES]    Frequency of Communication with Friends and Family: More than three times a week    Frequency of Social Gatherings with Friends and Family: Once a week    Attends Religious Services: Never    Marine scientist or Organizations: No    Attends Archivist Meetings: Never    Marital Status: Married    Review of Systems   Constitutional:  Negative for chills, fatigue and fever.  HENT:  Negative for congestion, ear pain, rhinorrhea and sore throat.   Respiratory:  Negative for cough and shortness of breath.   Cardiovascular:  Negative for chest pain.  Gastrointestinal:  Negative for abdominal pain, constipation, diarrhea, nausea and vomiting.  Genitourinary:  Negative for dysuria and urgency.  Musculoskeletal:  Positive for arthralgias. Negative for back pain and myalgias.  Neurological:  Positive for headaches. Negative for dizziness, weakness and light-headedness.  Psychiatric/Behavioral:  Negative for dysphoric mood. The patient is not nervous/anxious.      Objective:  BP 132/70   Pulse 76   Temp (!) 97.1 F (36.2 C)   Ht 5' 5.5" (1.664 m)   Wt 144 lb (65.3 kg)   LMP  (LMP Unknown)   SpO2 99%   BMI 23.60 kg/m       04/08/2022    9:46 AM 04/01/2022   10:49 AM 01/26/2022    3:06 PM  BP/Weight  Systolic BP 741 98 638  Diastolic BP 64 64 68  Wt. (Lbs) 143 147 145  BMI 23.43 kg/m2 24.09 kg/m2 24.13 kg/m2    Physical Exam Vitals reviewed.  Constitutional:      Appearance: Normal appearance.  HENT:     Head: Normocephalic.     Right Ear: Tympanic membrane normal.     Left Ear: Tympanic membrane normal.     Nose: Nose normal.     Mouth/Throat:     Mouth: Mucous membranes are moist.     Dentition: Abnormal dentition.  Eyes:     Pupils: Pupils are equal, round, and reactive to light.  Cardiovascular:     Rate and Rhythm: Normal rate and regular rhythm.  Pulmonary:     Effort: Pulmonary effort is normal.  Breath sounds: Normal breath sounds.  Abdominal:     General: Bowel sounds are normal.     Palpations: Abdomen is soft.  Musculoskeletal:        General: Normal range of motion.     Cervical back: Neck supple.  Skin:    General: Skin is warm and dry.     Capillary Refill: Capillary refill takes less than 2 seconds.  Neurological:     General: No focal deficit present.      Mental Status: She is alert and oriented to person, place, and time.  Psychiatric:        Mood and Affect: Mood normal.        Behavior: Behavior normal.     Lab Results  Component Value Date   WBC 4.5 01/27/2022   HGB 13.3 01/27/2022   HCT 41.0 01/27/2022   PLT 144 (L) 01/27/2022   GLUCOSE 95 01/27/2022   CHOL 149 01/27/2022   TRIG 74 01/27/2022   HDL 74 01/27/2022   LDLCALC 61 01/27/2022   ALT 30 01/27/2022   AST 24 01/27/2022   NA 141 01/27/2022   K 4.8 01/27/2022   CL 102 01/27/2022   CREATININE 1.23 (H) 01/27/2022   BUN 18 01/27/2022   CO2 24 01/27/2022   TSH 1.130 01/27/2022   HGBA1C 5.4 11/25/2021      Assessment & Plan:   1. Primary hypertension-well controlled - CBC with Differential/Platelet - Comprehensive metabolic panel  2. Gastroesophageal reflux disease, unspecified whether esophagitis present-well controlled - CBC with Differential/Platelet - Comprehensive metabolic panel  3. Encounter for immunization - Flu Vaccine MDCK QUAD PF  4. Panic disorder (episodic paroxysmal anxiety)-not at goal -continue Zoloft 25 mg QD -continue appt with Psychiatrist and therapist    We will call you with lab results Continue medications Stop Goody Powders Follow-up in 52-month, sooner if needed  Follow-up: 348-month An After Visit Summary was printed and given to the patient.  I, ShRip HarbourNP, have reviewed all documentation for this visit. The documentation on 05/03/22 for the exam, diagnosis, procedures, and orders are all accurate and complete.    Signed, ShRip HarbourNP [phone]

## 2022-05-03 NOTE — Patient Instructions (Signed)
We will call you with lab results Continue medications [facility] Follow-up in 44-months, sooner if needed    Food Choices for Gastroesophageal Reflux Disease, Adult When you have gastroesophageal reflux disease (GERD), the foods you eat and your eating habits are very important. Choosing the right foods can help ease your discomfort. Think about working with a food expert (dietitian) to help you make good choices. What are tips for following this plan? Reading food labels Look for foods that are low in saturated fat. Foods that may help with your symptoms include: Foods that have less than 5% of daily value (DV) of fat. Foods that have 0 grams of trans fat. Cooking Do not fry your food. Cook your food by baking, steaming, grilling, or broiling. These are all methods that do not need a lot of fat for cooking. To add flavor, try to use herbs that are low in spice and acidity. Meal planning  Choose healthy foods that are low in fat, such as: Fruits and vegetables. Whole grains. Low-fat dairy products. Lean meats, fish, and poultry. Eat small meals often instead of eating 3 large meals each day. Eat your meals slowly in a place where you are relaxed. Avoid bending over or lying down until 2-3 hours after eating. Limit high-fat foods such as fatty meats or fried foods. Limit your intake of fatty foods, such as oils, butter, and shortening. Avoid the following as told by your doctor: Foods that cause symptoms. These may be different for different people. Keep a food diary to keep track of foods that cause symptoms. Alcohol. Drinking a lot of liquid with meals. Eating meals during the 2-3 hours before bed. Lifestyle Stay at a healthy weight. Ask your doctor what weight is healthy for you. If you need to lose weight, work with your doctor to do so safely. Exercise for at least 30 minutes on 5 or more days each week, or as told by your doctor. Wear loose-fitting clothes. Do not  smoke or use any products that contain nicotine or tobacco. If you need help quitting, ask your doctor. Sleep with the head of your bed higher than your feet. Use a wedge under the mattress or blocks under the bed frame to raise the head of the bed. Chew sugar-free gum after meals. What foods should eat?  Eat a healthy, well-balanced diet of fruits, vegetables, whole grains, low-fat dairy products, lean meats, fish, and poultry. Each person is different. Foods that may cause symptoms in one person may not cause any symptoms in another person. Work with your doctor to find foods that are safe for you. The items listed above may not be a complete list of what you can eat and drink. Contact a food expert for more options. What foods should I avoid? Limiting some of these foods may help in managing the symptoms of GERD. Everyone is different. Talk with a food expert or your doctor to help you find the exact foods to avoid, if any. Fruits Any fruits prepared with added fat. Any fruits that cause symptoms. For some people, this may include citrus fruits, such as oranges, grapefruit, pineapple, and lemons. Vegetables Deep-fried vegetables. [place] fries. Any vegetables prepared with added fat. Any vegetables that cause symptoms. For some people, this may include tomatoes and tomato products, chili peppers, onions and garlic, and horseradish. Grains Pastries or quick breads with added fat. Meats and other proteins High-fat meats, such as fatty beef or pork, hot dogs, ribs, ham, sausage, salami,  and bacon. Fried meat or protein, including fried fish and fried chicken. Nuts and nut butters, in large amounts. Dairy Whole milk and chocolate milk. Sour cream. Cream. Ice cream. Cream cheese. Milkshakes. Fats and oils Butter. Margarine. Shortening. Ghee. Beverages Coffee and tea, with or without caffeine. Carbonated beverages. Sodas. Energy drinks. Fruit juice made with acidic fruits, such as orange or  grapefruit. Tomato juice. Alcoholic drinks. Sweets and desserts Chocolate and cocoa. Donuts. Seasonings and condiments Pepper. Peppermint and spearmint. Added salt. Any condiments, herbs, or seasonings that cause symptoms. For some people, this may include curry, hot sauce, or vinegar-based salad dressings. The items listed above may not be a complete list of what you should not eat and drink. Contact a food expert for more options. Questions to ask your doctor Diet and lifestyle changes are often the first steps that are taken to manage symptoms of GERD. If diet and lifestyle changes do not help, talk with your doctor about taking medicines. Where to find more information International Foundation for Gastrointestinal Disorders: aboutgerd.org Summary When you have GERD, food and lifestyle choices are very important in easing your symptoms. Eat small meals often instead of 3 large meals a day. Eat your meals slowly and in a place where you are relaxed. Avoid bending over or lying down until 2-3 hours after eating. Limit high-fat foods such as fatty meats or fried foods. This information is not intended to replace advice given to you by your health care provider. Make sure you discuss any questions you have with your health care provider. Document Revised: 01/14/2020 Document Reviewed: 01/14/2020 Elsevier Patient Education  [place].

## 2022-05-04 LAB — CBC WITH DIFFERENTIAL/PLATELET
Basophils Absolute: 0.1 10*3/uL (ref 0.0–0.2)
Basos: 3 %
EOS (ABSOLUTE): 0.3 10*3/uL (ref 0.0–0.4)
Eos: 7 %
Hematocrit: 41.5 % (ref 34.0–46.6)
Hemoglobin: 13.3 g/dL (ref 11.1–15.9)
Immature Grans (Abs): 0 10*3/uL (ref 0.0–0.1)
Immature Granulocytes: 0 %
Lymphocytes Absolute: 1.5 10*3/uL (ref 0.7–3.1)
Lymphs: 36 %
MCH: 28.5 pg (ref 26.6–33.0)
MCHC: 32 g/dL (ref 31.5–35.7)
MCV: 89 fL (ref 79–97)
Monocytes Absolute: 0.3 10*3/uL (ref 0.1–0.9)
Monocytes: 7 %
Neutrophils Absolute: 2.1 10*3/uL (ref 1.4–7.0)
Neutrophils: 47 %
Platelets: 178 10*3/uL (ref 150–450)
RBC: 4.67 x10E6/uL (ref 3.77–5.28)
RDW: 13.1 % (ref 11.7–15.4)
WBC: 4.3 10*3/uL (ref 3.4–10.8)

## 2022-05-04 LAB — COMPREHENSIVE METABOLIC PANEL
ALT: 29 IU/L (ref 0–32)
AST: 34 IU/L (ref 0–40)
Albumin/Globulin Ratio: 1.8 (ref 1.2–2.2)
Albumin: 4.5 g/dL (ref 3.9–4.9)
Alkaline Phosphatase: 198 IU/L — ABNORMAL HIGH (ref 44–121)
BUN/Creatinine Ratio: 13 (ref 12–28)
BUN: 16 mg/dL (ref 8–27)
Bilirubin Total: 0.4 mg/dL (ref 0.0–1.2)
CO2: 25 mmol/L (ref 20–29)
Calcium: 9.6 mg/dL (ref 8.7–10.3)
Chloride: 103 mmol/L (ref 96–106)
Creatinine, Ser: 1.22 mg/dL — ABNORMAL HIGH (ref 0.57–1.00)
Globulin, Total: 2.5 g/dL (ref 1.5–4.5)
Glucose: 88 mg/dL (ref 70–99)
Potassium: 4.2 mmol/L (ref 3.5–5.2)
Sodium: 141 mmol/L (ref 134–144)
Total Protein: 7 g/dL (ref 6.0–8.5)
eGFR: 50 mL/min/{1.73_m2} — ABNORMAL LOW (ref >59)

## 2022-05-06 ENCOUNTER — Other Ambulatory Visit: Payer: Self-pay

## 2022-05-06 DIAGNOSIS — R799 Abnormal finding of blood chemistry, unspecified: Secondary | ICD-10-CM

## 2022-05-13 ENCOUNTER — Other Ambulatory Visit: Payer: Medicaid Other

## 2022-05-13 DIAGNOSIS — R799 Abnormal finding of blood chemistry, unspecified: Secondary | ICD-10-CM

## 2022-05-13 LAB — COMPREHENSIVE METABOLIC PANEL
ALT: 19 IU/L (ref 0–32)
AST: 16 IU/L (ref 0–40)
Albumin/Globulin Ratio: 2 (ref 1.2–2.2)
Albumin: 4.5 g/dL (ref 3.9–4.9)
Alkaline Phosphatase: 141 IU/L — ABNORMAL HIGH (ref 44–121)
BUN/Creatinine Ratio: 15 (ref 12–28)
BUN: 17 mg/dL (ref 8–27)
Bilirubin Total: 0.5 mg/dL (ref 0.0–1.2)
CO2: 25 mmol/L (ref 20–29)
Calcium: 9.5 mg/dL (ref 8.7–10.3)
Chloride: 103 mmol/L (ref 96–106)
Creatinine, Ser: 1.14 mg/dL — ABNORMAL HIGH (ref 0.57–1.00)
Globulin, Total: 2.3 g/dL (ref 1.5–4.5)
Glucose: 103 mg/dL — ABNORMAL HIGH (ref 70–99)
Potassium: 4.8 mmol/L (ref 3.5–5.2)
Sodium: 142 mmol/L (ref 134–144)
Total Protein: 6.8 g/dL (ref 6.0–8.5)
eGFR: 55 mL/min/{1.73_m2} — ABNORMAL LOW (ref >59)

## 2022-05-20 DIAGNOSIS — F319 Bipolar disorder, unspecified: Secondary | ICD-10-CM | POA: Diagnosis not present

## 2022-06-08 ENCOUNTER — Other Ambulatory Visit: Payer: Self-pay | Admitting: Nurse Practitioner

## 2022-06-15 DIAGNOSIS — H5213 Myopia, bilateral: Secondary | ICD-10-CM | POA: Diagnosis not present

## 2022-07-05 DIAGNOSIS — F321 Major depressive disorder, single episode, moderate: Secondary | ICD-10-CM | POA: Diagnosis not present

## 2022-07-05 DIAGNOSIS — F411 Generalized anxiety disorder: Secondary | ICD-10-CM | POA: Diagnosis not present

## 2022-07-26 ENCOUNTER — Other Ambulatory Visit: Payer: Self-pay | Admitting: Nurse Practitioner

## 2022-07-26 DIAGNOSIS — K219 Gastro-esophageal reflux disease without esophagitis: Secondary | ICD-10-CM

## 2022-08-06 DIAGNOSIS — M1712 Unilateral primary osteoarthritis, left knee: Secondary | ICD-10-CM | POA: Diagnosis not present

## 2022-08-19 DIAGNOSIS — F321 Major depressive disorder, single episode, moderate: Secondary | ICD-10-CM | POA: Diagnosis not present

## 2022-08-19 DIAGNOSIS — F411 Generalized anxiety disorder: Secondary | ICD-10-CM | POA: Diagnosis not present

## 2022-08-23 ENCOUNTER — Telehealth: Payer: Self-pay

## 2022-08-23 NOTE — Telephone Encounter (Signed)
Mr. Walen called with questions about his wife.  She has a cough and he wants to know what medication she could try.  I suggested mucinex, robitussin dm or delsym OTC.  He was encouarged to have her make an appointment to come in if symptoms worsen or do not improve.

## 2022-09-07 ENCOUNTER — Other Ambulatory Visit: Payer: Self-pay

## 2022-09-07 MED ORDER — ATORVASTATIN CALCIUM 40 MG PO TABS: 40.0000 mg | ORAL_TABLET | Freq: Every day | ORAL | 0 refills | Status: DC

## 2022-09-10 ENCOUNTER — Other Ambulatory Visit: Payer: Self-pay

## 2022-09-10 MED ORDER — AMLODIPINE BESYLATE 5 MG PO TABS: 5.0000 mg | ORAL_TABLET | Freq: Every day | ORAL | 0 refills | Status: DC

## 2022-09-10 NOTE — Telephone Encounter (Signed)
Patient called requesting refill of Amlodipine be sent to [facility].  Last refilled 06/08/22 #[id number]

## 2022-09-16 ENCOUNTER — Encounter: Payer: Self-pay | Admitting: Radiology

## 2022-09-16 DIAGNOSIS — F321 Major depressive disorder, single episode, moderate: Secondary | ICD-10-CM | POA: Diagnosis not present

## 2022-09-16 DIAGNOSIS — F411 Generalized anxiety disorder: Secondary | ICD-10-CM | POA: Diagnosis not present

## 2022-09-28 ENCOUNTER — Encounter: Payer: Self-pay | Admitting: Gastroenterology

## 2022-10-14 DIAGNOSIS — F321 Major depressive disorder, single episode, moderate: Secondary | ICD-10-CM | POA: Diagnosis not present

## 2022-10-14 DIAGNOSIS — F411 Generalized anxiety disorder: Secondary | ICD-10-CM | POA: Diagnosis not present

## 2022-11-03 DIAGNOSIS — M25561 Pain in right knee: Secondary | ICD-10-CM | POA: Diagnosis not present

## 2022-11-03 DIAGNOSIS — G8929 Other chronic pain: Secondary | ICD-10-CM | POA: Diagnosis not present

## 2022-11-08 ENCOUNTER — Ambulatory Visit: Payer: Medicaid Other | Admitting: Nurse Practitioner

## 2022-11-08 ENCOUNTER — Other Ambulatory Visit: Payer: Self-pay

## 2022-11-08 DIAGNOSIS — K219 Gastro-esophageal reflux disease without esophagitis: Secondary | ICD-10-CM

## 2022-11-08 MED ORDER — AMLODIPINE BESYLATE 5 MG PO TABS: 5.0000 mg | ORAL_TABLET | Freq: Every day | ORAL | 0 refills | Status: DC

## 2022-11-08 MED ORDER — PANTOPRAZOLE SODIUM 40 MG PO TBEC: DELAYED_RELEASE_TABLET | ORAL | 0 refills | Status: DC

## 2022-11-11 DIAGNOSIS — F321 Major depressive disorder, single episode, moderate: Secondary | ICD-10-CM | POA: Diagnosis not present

## 2022-11-11 DIAGNOSIS — F411 Generalized anxiety disorder: Secondary | ICD-10-CM | POA: Diagnosis not present

## 2022-11-15 ENCOUNTER — Encounter: Payer: Self-pay | Admitting: Family Medicine

## 2022-11-15 ENCOUNTER — Ambulatory Visit: Payer: Medicaid Other | Admitting: Family Medicine

## 2022-11-15 VITALS — BP 120/64 | HR 84 | Temp 97.2°F | Resp 16 | Ht 65.5 in | Wt 150.4 lb

## 2022-11-15 DIAGNOSIS — E782 Mixed hyperlipidemia: Secondary | ICD-10-CM | POA: Diagnosis not present

## 2022-11-15 DIAGNOSIS — K219 Gastro-esophageal reflux disease without esophagitis: Secondary | ICD-10-CM

## 2022-11-15 DIAGNOSIS — R82998 Other abnormal findings in urine: Secondary | ICD-10-CM | POA: Insufficient documentation

## 2022-11-15 DIAGNOSIS — I10 Essential (primary) hypertension: Secondary | ICD-10-CM

## 2022-11-15 DIAGNOSIS — Z01818 Encounter for other preprocedural examination: Secondary | ICD-10-CM | POA: Insufficient documentation

## 2022-11-15 DIAGNOSIS — N1831 Chronic kidney disease, stage 3a: Secondary | ICD-10-CM

## 2022-11-15 DIAGNOSIS — N3000 Acute cystitis without hematuria: Secondary | ICD-10-CM | POA: Diagnosis not present

## 2022-11-15 LAB — POCT URINALYSIS DIPSTICK
Bilirubin, UA: NEGATIVE
Blood, UA: NEGATIVE
Glucose, UA: NEGATIVE
Ketones, UA: NEGATIVE
Nitrite, UA: NEGATIVE
Protein, UA: NEGATIVE
Spec Grav, UA: 1.015 (ref 1.010–1.025)
Urobilinogen, UA: 0.2 E.U./dL
pH, UA: 6 (ref 5.0–8.0)

## 2022-11-15 NOTE — Assessment & Plan Note (Signed)
The current medical regimen is effective;  continue present plan and medications. Continue amlodipine 5 mg daily.

## 2022-11-15 NOTE — Assessment & Plan Note (Signed)
Well controlled.  No changes to medicines. Continue atorvastatin 40 mg before bed.  Continue to work on eating a healthy diet and exercise.  Labs drawn today.   

## 2022-11-15 NOTE — Assessment & Plan Note (Signed)
Check EKG, labs. Will need cxr also.

## 2022-11-15 NOTE — Assessment & Plan Note (Signed)
The current medical regimen is effective;  continue present plan and medications. Continue pantoprazole 40 mg twice daily.  

## 2022-11-15 NOTE — Assessment & Plan Note (Signed)
Stable. Check cmp  

## 2022-11-15 NOTE — Progress Notes (Signed)
Subjective:  Patient ID: Kathy Perry, female    DOB: Dec 04, 1960  Age: 62 y.o. MRN: [id number]  Chief Complaint  Patient presents with   Hyperlipidemia    HPI Preop clearance: for TKR.   Hyperlipidemia: Current medications: atorvastatin 40 mg one before bed.   Hypertension: Complications: CKD STAGE 3b Current medications: amlodipine 5 mg daily.   GERD: on pantoprazole 40 mg twice daily.   Depression: on zoloft 25 mg daily. Sees Zorita Pang and gets her medicines from her.   VITAMIN D DF: Vitamin D 125 mcg daily .  Complains of palpitations and heart racing. Patient was seen in 2019 and had an echo normal and holtor monitor normal.   Diet: poor. Stressed out and so is not eating and if does eats sweets.  Exercise: walking 4 times per day.       11/15/2022   11:13 AM 05/03/2022   11:02 AM 01/26/2022    3:22 PM 01/26/2022    3:07 PM 11/24/2021    1:14 PM  Depression screen PHQ 2/9  Decreased Interest 0 1 1 0 0  Down, Depressed, Hopeless 1 1 1  0 0  PHQ - 2 Score 1 2 2  0 0  Altered sleeping 0 1 1    Tired, decreased energy 3 3 3     Change in appetite 0 0 0    Feeling bad or failure about yourself  1 0 0    Trouble concentrating 0 0 0    Moving slowly or fidgety/restless 1 0 0    Suicidal thoughts 0 0 0    PHQ-9 Score 6 6 6     Difficult doing work/chores Somewhat difficult Not difficult at all Somewhat difficult           09/08/2021    1:35 PM 11/24/2021    1:13 PM 01/26/2022    3:07 PM 05/03/2022   11:02 AM 11/15/2022   12:03 PM  Fall Risk  Falls in the past year? 1 1 0 0 0  Was there an injury with Fall? 1 0 0 0 0  Fall Risk Category Calculator 3 1 0 0 0  Fall Risk Category (Retired) High Low Low Low   (RETIRED) Patient Fall Risk Level High fall risk Low fall risk Low fall risk Low fall risk   Patient at Risk for Falls Due to  History of fall(s) No Fall Risks No Fall Risks No Fall Risks  Fall risk Follow up  Falls evaluation completed Falls evaluation  completed Falls evaluation completed Falls evaluation completed      Review of Systems  Constitutional:  Positive for fatigue. Negative for chills and fever.  HENT:  Negative for congestion, rhinorrhea and sore throat.   Eyes:  Positive for pain (right eye- had recent eye exam w/[facility]).  Respiratory:  Negative for cough and shortness of breath.   Cardiovascular:  Negative for chest pain.  Gastrointestinal:  Negative for abdominal pain, constipation, diarrhea, nausea and vomiting.  Genitourinary:  Negative for dysuria and urgency.  Musculoskeletal:  Positive for back pain. Negative for myalgias.  Neurological:  Positive for light-headedness. Negative for dizziness, weakness and headaches.  Psychiatric/Behavioral:  Positive for dysphoric mood (follows with a counselor). The patient is not nervous/anxious.     Current Outpatient Medications on File Prior to Visit  Medication Sig Dispense Refill   amLODipine (NORVASC) 5 MG tablet Take 1 tablet (5 mg total) by mouth daily. 90 tablet 0   Aspirin-Acetaminophen-Caffeine (GOODYS EXTRA STRENGTH)  938-182-99 MG PACK Take 1 Package by mouth daily as needed (pain).     atorvastatin (LIPITOR) 40 MG tablet Take 1 tablet (40 mg total) by mouth daily. 90 tablet 0   ondansetron (ZOFRAN) 4 MG tablet Take 1 tablet (4 mg total) by mouth every 8 (eight) hours as needed for nausea or vomiting. 30 tablet 3   pantoprazole (PROTONIX) 40 MG tablet TAKE ONE TABLET BY MOUTH TWICE DAILY before a meal 180 tablet 0   sertraline (ZOLOFT) 25 MG tablet Take 1 tablet (25 mg total) by mouth daily. 30 tablet 3   VITAMIN D, ERGOCALCIFEROL, PO Take 125 mcg by mouth daily.     No current facility-administered medications on file prior to visit.   Past Medical History:  Diagnosis Date   Anxiety    Arthritis    GERD (gastroesophageal reflux disease)    IBS (irritable bowel syndrome)    Need for Tdap vaccination 11/13/2019   Past Surgical History:  Procedure  Laterality Date   BALLOON DILATION N/A 06/23/2021   Procedure: BALLOON DILATION;  Surgeon: Lanelle Bal, DO;  Location: [facility];  Service: Endoscopy;  Laterality: N/A;   BIOPSY  06/23/2021   Procedure: BIOPSY;  Surgeon: Lanelle Bal, DO;  Location: [facility];  Service: Endoscopy;;   BREAST EXCISIONAL BIOPSY     BREAST LUMPECTOMY WITH RADIOACTIVE SEED LOCALIZATION Left 02/28/2020   Procedure: LEFT BREAST LUMPECTOMY WITH RADIOACTIVE SEED LOCALIZATION;  Surgeon: Manus Rudd, MD;  Location: [facility];  Service: General;  Laterality: Left;  LMA   ESOPHAGOGASTRODUODENOSCOPY (EGD) WITH PROPOFOL N/A 06/23/2021   Procedure: ESOPHAGOGASTRODUODENOSCOPY (EGD) WITH PROPOFOL;  Surgeon: Lanelle Bal, DO;  Location: [facility];  Service: Endoscopy;  Laterality: N/A;   TUBAL LIGATION     WISDOM TOOTH EXTRACTION      Family History  Problem Relation Age of Onset   Breast cancer Paternal Grandmother    Diabetes Father    Cancer Father        not sure primary   Memory loss Mother    Cancer Brother        thyroid   COPD Brother    Breast cancer Daughter    Breast cancer Paternal Aunt    Lung cancer Paternal Aunt    Colon cancer Neg Hx    Esophageal cancer Neg Hx    Gastric cancer Neg Hx    Social History   Socioeconomic History   Marital status: Married    Spouse name: Actor   Number of children: 3   Years of education: Not on file   Highest education level: 9th grade  Occupational History   Occupation: homemaker  Tobacco Use   Smoking status: Never   Smokeless tobacco: Never  Vaping Use   Vaping Use: Never used  Substance and Sexual Activity   Alcohol use: No   Drug use: Never   Sexual activity: Not Currently    Birth control/protection: Surgical, Post-menopausal    Comment: tubal  Other Topics Concern   Not on file  Social History Narrative   Lives with [place] years   1 son, 2 daughters   4 grandchildren       Cat: Lucy    Dog: Roofus       Enjoys: walking with dog, flowers, sewing, iron       Diet: eats all food groups-limited meat    Caffeine: some coke    Water: 2-4 cups of  water      Wears seat belt   Smoke detectors at home    Social Determinants of Health   Financial Resource Strain: Low Risk  (09/08/2021)   Overall Financial Resource Strain (CARDIA)    Difficulty of Paying Living Expenses: Not very hard  Food Insecurity: No Food Insecurity (09/08/2021)   Hunger Vital Sign    Worried About Running Out of Food in the Last Year: Never true    Ran Out of Food in the Last Year: Never true  Transportation Needs: No Transportation Needs (09/08/2021)   PRAPARE - Administrator, Civil Service (Medical): No    Lack of Transportation (Non-Medical): No  Physical Activity: Insufficiently Active (09/08/2021)   Exercise Vital Sign    Days of Exercise per Week: 7 days    Minutes of Exercise per Session: 20 min  Stress: Stress Concern Present (09/08/2021)   [employer] of Occupational Health - Occupational Stress Questionnaire    Feeling of Stress : To some extent  Social Connections: Moderately Isolated (09/08/2021)   Social Connection and Isolation Panel [NHANES]    Frequency of Communication with Friends and Family: More than three times a week    Frequency of Social Gatherings with Friends and Family: Once a week    Attends Religious Services: Never    Diplomatic Services operational officer: No    Attends Engineer, structural: Never    Marital Status: Married    Objective:  BP 120/64   Pulse 84   Temp (!) 97.2 F (36.2 C)   Resp 16   Ht 5' 5.5" (1.664 m)   Wt 150 lb 6.4 oz (68.2 kg)   LMP  (LMP Unknown)   BMI 24.65 kg/m      11/15/2022   11:07 AM 05/03/2022   11:01 AM 04/08/2022    9:46 AM  BP/Weight  Systolic BP 120 132 116  Diastolic BP 64 70 64  Wt. (Lbs) 150.4 144 143  BMI 24.65 kg/m2 23.6 kg/m2 23.43 kg/m2    Physical Exam Vitals reviewed.   Constitutional:      Appearance: Normal appearance. She is normal weight.  HENT:     Mouth/Throat:     Dentition: Dental caries present.     Pharynx: No oropharyngeal exudate or posterior oropharyngeal erythema.  Neck:     Vascular: No carotid bruit.  Cardiovascular:     Rate and Rhythm: Normal rate and regular rhythm.     Heart sounds: Normal heart sounds.  Pulmonary:     Effort: Pulmonary effort is normal. No respiratory distress.     Breath sounds: Normal breath sounds.  Abdominal:     General: Abdomen is flat. Bowel sounds are normal.     Palpations: Abdomen is soft.     Tenderness: There is no abdominal tenderness.  Neurological:     Mental Status: She is alert and oriented to person, place, and time.  Psychiatric:        Mood and Affect: Mood normal.        Behavior: Behavior normal.     Diabetic Foot Exam - Simple   No data filed      Lab Results  Component Value Date   WBC 4.3 05/03/2022   HGB 13.3 05/03/2022   HCT 41.5 05/03/2022   PLT 178 05/03/2022   GLUCOSE 103 (H) 05/13/2022   CHOL 149 01/27/2022   TRIG 74 01/27/2022   HDL 74 01/27/2022   LDLCALC 61 01/27/2022  ALT 19 05/13/2022   AST 16 05/13/2022   NA 142 05/13/2022   K 4.8 05/13/2022   CL 103 05/13/2022   CREATININE 1.14 (H) 05/13/2022   BUN 17 05/13/2022   CO2 25 05/13/2022   TSH 1.130 01/27/2022   HGBA1C 5.4 11/25/2021      Assessment & Plan:    Preoperative clearance Assessment & Plan: Check EKG, labs. Will need cxr also.   Orders: -     CBC with Differential/Platelet -     Comprehensive metabolic panel -     TSH -     Protime-INR -     Lipid panel -     APTT -     MRSA culture -     EKG 12-Lead -     DG Chest 2 View; Future  Leukocytes in urine Assessment & Plan: Check urine culture.  Orders: -     POCT urinalysis dipstick -     Urine Culture  Primary hypertension Assessment & Plan: The current medical regimen is effective;  continue present plan and  medications. Continue amlodipine 5 mg daily.   Orders: -     TSH -     Lipid panel  Gastroesophageal reflux disease, unspecified whether esophagitis present Assessment & Plan: The current medical regimen is effective;  continue present plan and medications. Continue pantoprazole 40 mg twice daily.    Stage 3a chronic kidney disease (HCC) Assessment & Plan: Stable. Check cmp.   Mixed hyperlipidemia Assessment & Plan: Well controlled.  No changes to medicines. Continue atorvastatin 40 mg before bed.  Continue to work on eating a healthy diet and exercise.  Labs drawn today.        No orders of the defined types were placed in this encounter.   Orders Placed This Encounter  Procedures   Urine Culture   MRSA culture   DG Chest 2 View   CBC with Differential/Platelet   Comprehensive metabolic panel   TSH   Protime-INR   Lipid Panel   APTT   POCT urinalysis dipstick   EKG 12-Lead     Follow-up: No follow-ups on file.   I,Carolyn M Morrison,acting as a Neurosurgeon for Blane Ohara, MD.,have documented all relevant documentation on the behalf of Blane Ohara, MD,as directed by  Blane Ohara, MD while in the presence of Blane Ohara, MD.   An After Visit Summary was printed and given to the patient.  Blane Ohara, MD [facility] [phone]

## 2022-11-15 NOTE — Assessment & Plan Note (Signed)
Check urine culture  

## 2022-11-16 LAB — COMPREHENSIVE METABOLIC PANEL
ALT: 17 IU/L (ref 0–32)
AST: 17 IU/L (ref 0–40)
Albumin/Globulin Ratio: 2.2 (ref 1.2–2.2)
Albumin: 4.7 g/dL (ref 3.9–4.9)
Alkaline Phosphatase: 84 IU/L (ref 44–121)
BUN/Creatinine Ratio: 12 (ref 12–28)
BUN: 14 mg/dL (ref 8–27)
Bilirubin Total: 0.3 mg/dL (ref 0.0–1.2)
CO2: 24 mmol/L (ref 20–29)
Calcium: 10.1 mg/dL (ref 8.7–10.3)
Chloride: 103 mmol/L (ref 96–106)
Creatinine, Ser: 1.18 mg/dL — ABNORMAL HIGH (ref 0.57–1.00)
Globulin, Total: 2.1 g/dL (ref 1.5–4.5)
Glucose: 85 mg/dL (ref 70–99)
Potassium: 5.3 mmol/L — ABNORMAL HIGH (ref 3.5–5.2)
Sodium: 143 mmol/L (ref 134–144)
Total Protein: 6.8 g/dL (ref 6.0–8.5)
eGFR: 53 mL/min/{1.73_m2} — ABNORMAL LOW (ref >59)

## 2022-11-16 LAB — CBC WITH DIFFERENTIAL/PLATELET
Basophils Absolute: 0.1 10*3/uL (ref 0.0–0.2)
Basos: 2 %
EOS (ABSOLUTE): 0.1 10*3/uL (ref 0.0–0.4)
Eos: 3 %
Hematocrit: 41.7 % (ref 34.0–46.6)
Hemoglobin: 13.3 g/dL (ref 11.1–15.9)
Immature Grans (Abs): 0 10*3/uL (ref 0.0–0.1)
Immature Granulocytes: 0 %
Lymphocytes Absolute: 1.8 10*3/uL (ref 0.7–3.1)
Lymphs: 35 %
MCH: 28.7 pg (ref 26.6–33.0)
MCHC: 31.9 g/dL (ref 31.5–35.7)
MCV: 90 fL (ref 79–97)
Monocytes Absolute: 0.4 10*3/uL (ref 0.1–0.9)
Monocytes: 7 %
Neutrophils Absolute: 2.8 10*3/uL (ref 1.4–7.0)
Neutrophils: 53 %
Platelets: 153 10*3/uL (ref 150–450)
RBC: 4.63 x10E6/uL (ref 3.77–5.28)
RDW: 13.5 % (ref 11.7–15.4)
WBC: 5.3 10*3/uL (ref 3.4–10.8)

## 2022-11-16 LAB — PROTIME-INR
INR: 0.9 (ref 0.9–1.2)
Prothrombin Time: 9.8 s (ref 9.1–12.0)

## 2022-11-16 LAB — APTT: aPTT: 27 s (ref 24–33)

## 2022-11-16 LAB — LIPID PANEL
Chol/HDL Ratio: 2.2 ratio (ref 0.0–4.4)
Cholesterol, Total: 171 mg/dL (ref 100–199)
HDL: 77 mg/dL (ref >39)
LDL Chol Calc (NIH): 76 mg/dL (ref 0–99)
Triglycerides: 98 mg/dL (ref 0–149)
VLDL Cholesterol Cal: 18 mg/dL (ref 5–40)

## 2022-11-16 LAB — CARDIOVASCULAR RISK ASSESSMENT

## 2022-11-16 LAB — TSH: TSH: 0.765 u[IU]/mL (ref 0.450–4.500)

## 2022-11-17 DIAGNOSIS — Z01818 Encounter for other preprocedural examination: Secondary | ICD-10-CM | POA: Diagnosis not present

## 2022-11-17 LAB — MRSA CULTURE: MRSA Screen: NEGATIVE

## 2022-11-18 LAB — URINE CULTURE

## 2022-11-19 ENCOUNTER — Other Ambulatory Visit: Payer: Self-pay

## 2022-11-19 MED ORDER — NITROFURANTOIN MONOHYD MACRO 100 MG PO CAPS: 100.0000 mg | ORAL_CAPSULE | Freq: Two times a day (BID) | ORAL | 0 refills | Status: DC

## 2022-11-24 ENCOUNTER — Other Ambulatory Visit: Payer: Self-pay

## 2022-11-24 DIAGNOSIS — Z01818 Encounter for other preprocedural examination: Secondary | ICD-10-CM

## 2022-12-01 IMAGING — CR DG KNEE COMPLETE 4+V*L*
4 series · 4 of 4 positions shown · non-contrast
Comparison: None.

CLINICAL DATA: Swelling and pain after fall 4-5 months ago.

EXAM:
LEFT KNEE - COMPLETE 4+ VIEW

[t  ap left]
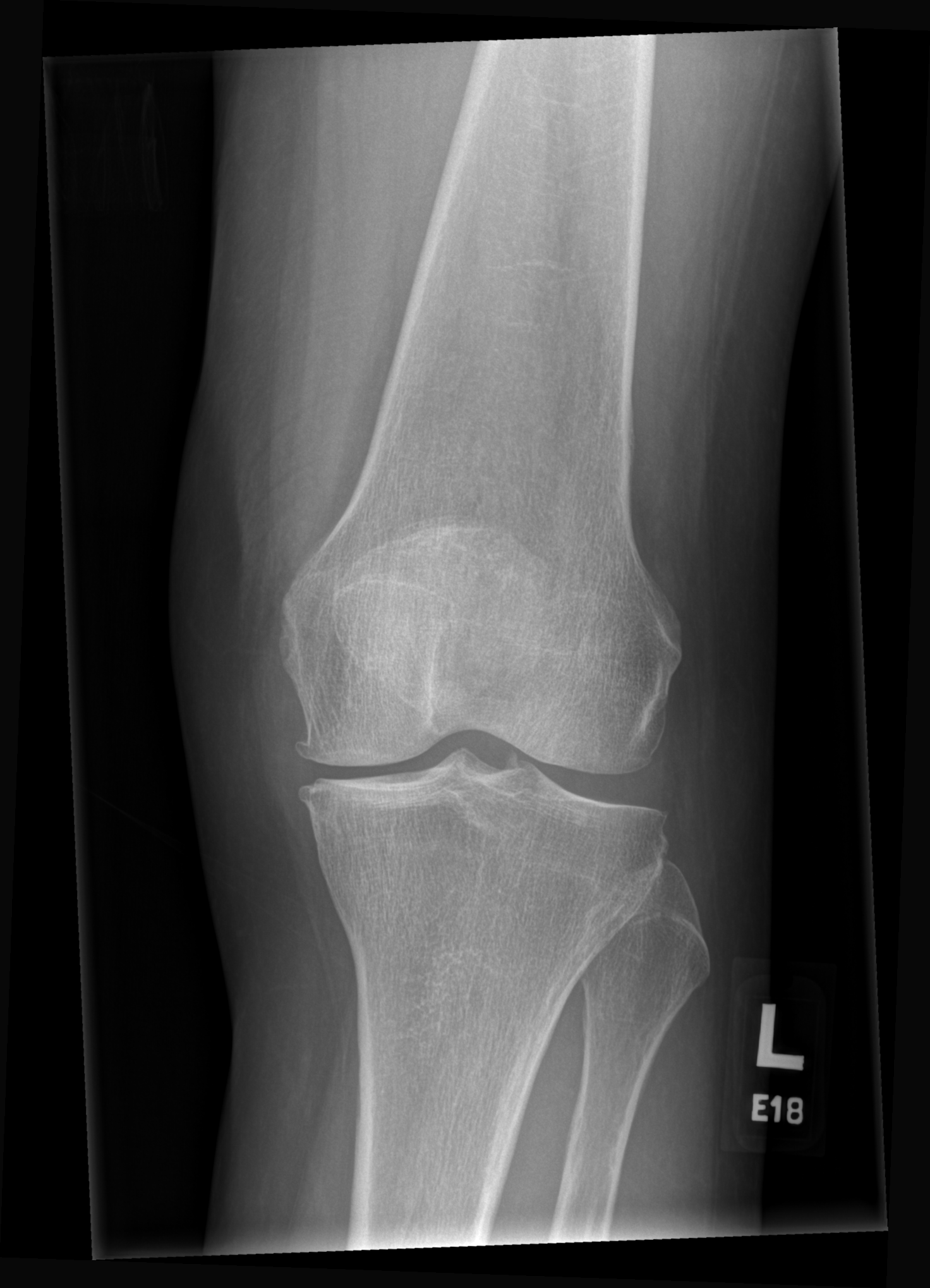

[t medial obique left (1 of 2)]
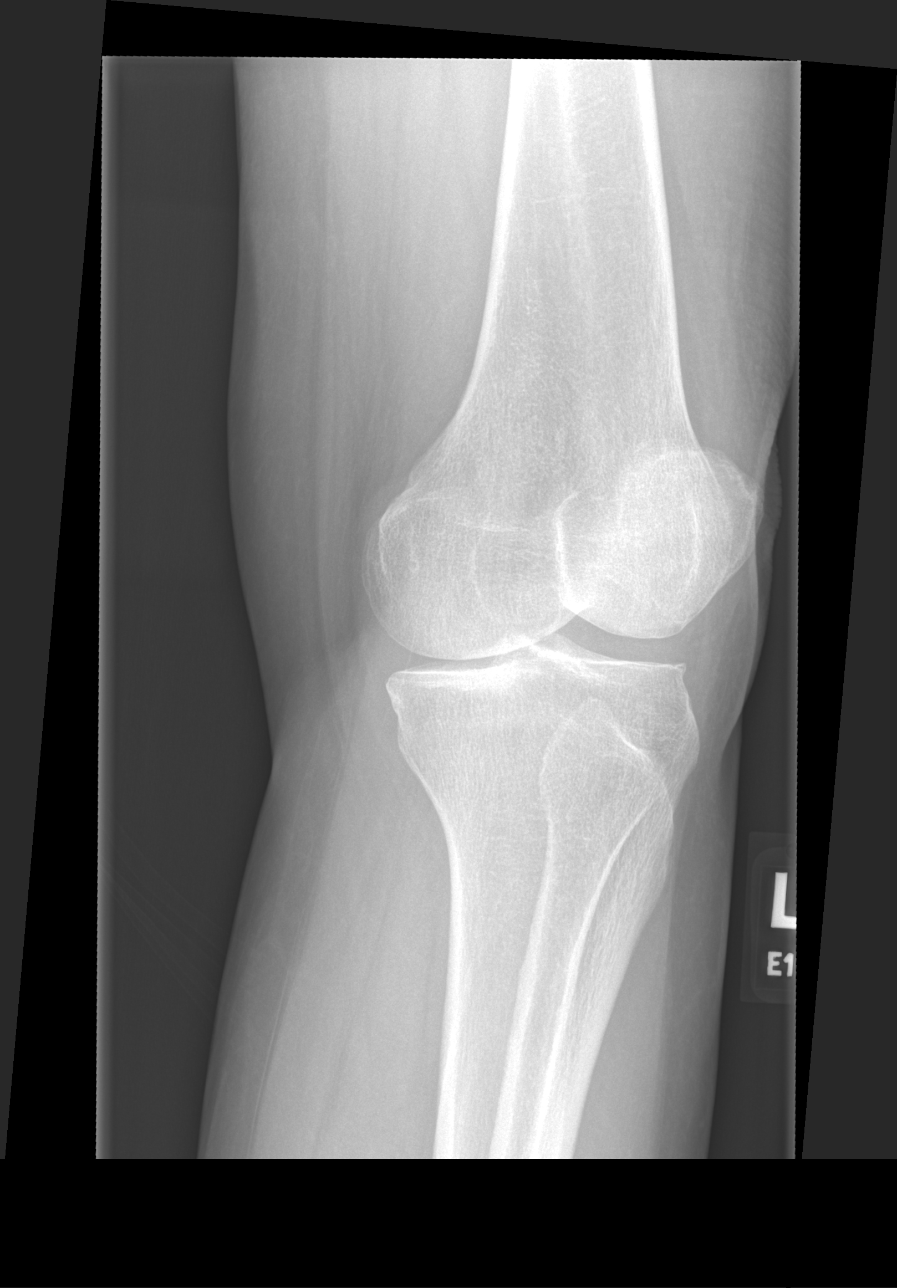

[t medial obique left (2 of 2)]
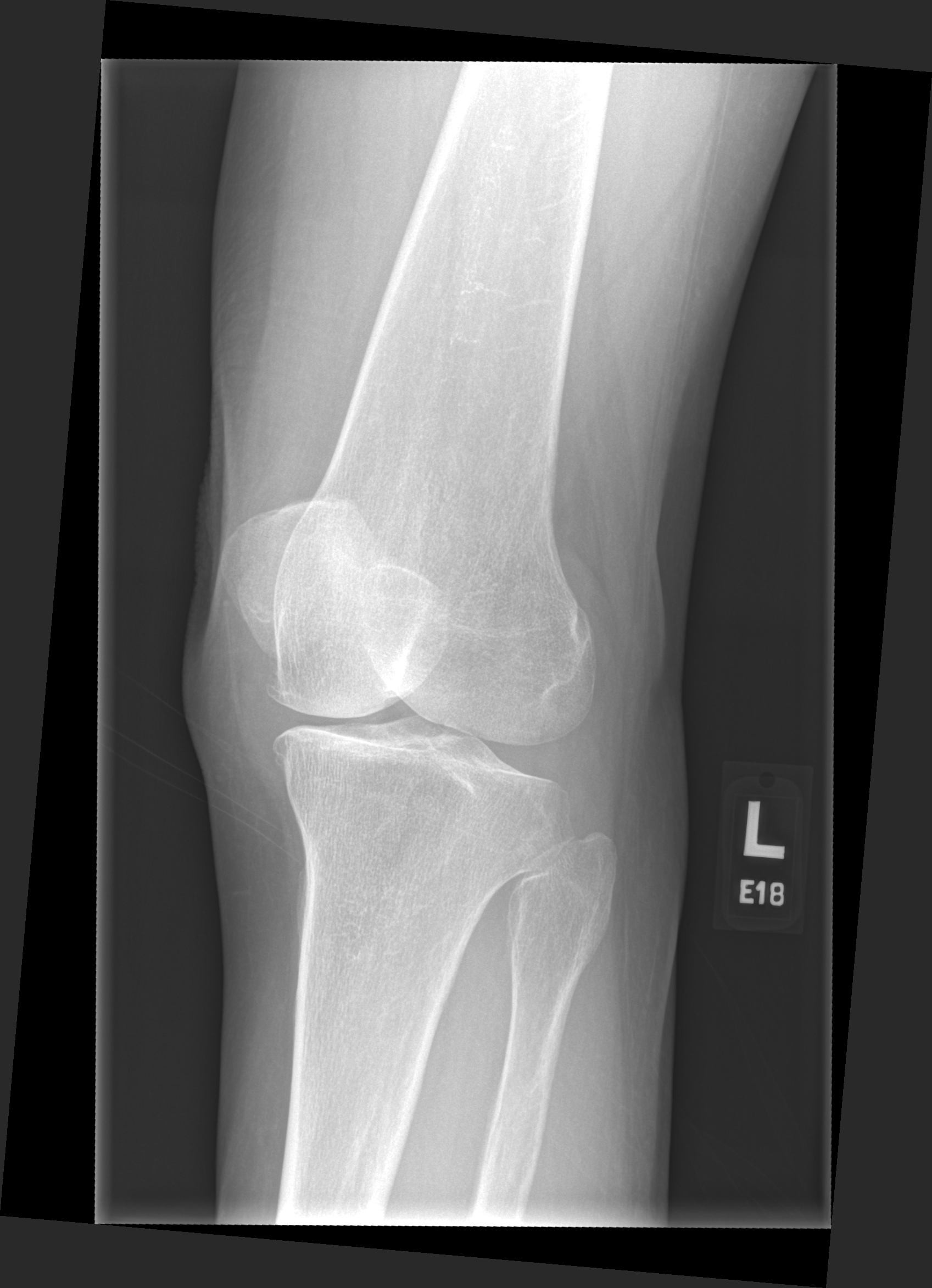

[t  lateral left]
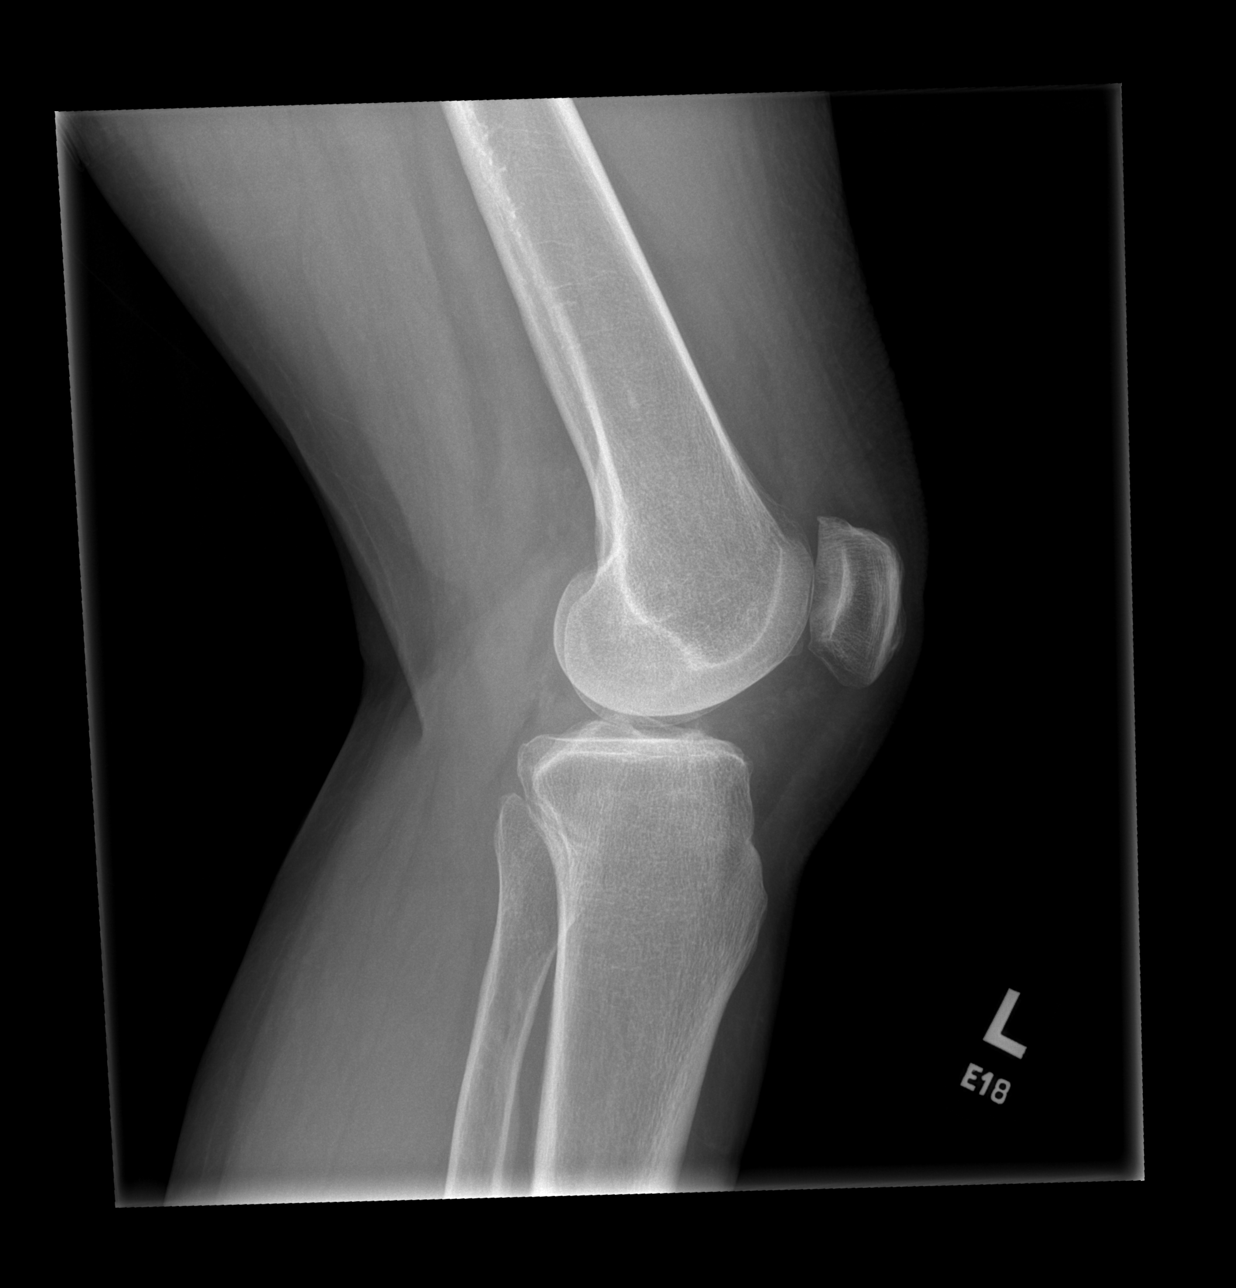

[4 of 4 positions shown; findings below may reference images not displayed]

FINDINGS: No evidence of fracture, dislocation, or joint effusion. Tiny
osteophytes are seen in the the lateral and patellofemoral
compartments. There is mild joint space narrowing in the medial
compartment. Soft tissues are unremarkable.
IMPRESSION: No acute findings. Mild degenerative changes.

## 2022-12-06 ENCOUNTER — Other Ambulatory Visit: Payer: Self-pay | Admitting: Family Medicine

## 2022-12-06 DIAGNOSIS — Z01818 Encounter for other preprocedural examination: Secondary | ICD-10-CM | POA: Diagnosis not present

## 2022-12-20 DIAGNOSIS — M1712 Unilateral primary osteoarthritis, left knee: Secondary | ICD-10-CM | POA: Diagnosis not present

## 2022-12-21 ENCOUNTER — Encounter: Payer: Self-pay | Admitting: Orthopedic Surgery

## 2022-12-21 DIAGNOSIS — Z96652 Presence of left artificial knee joint: Secondary | ICD-10-CM | POA: Diagnosis not present

## 2022-12-21 DIAGNOSIS — M1712 Unilateral primary osteoarthritis, left knee: Secondary | ICD-10-CM | POA: Diagnosis not present

## 2022-12-23 DIAGNOSIS — M1712 Unilateral primary osteoarthritis, left knee: Secondary | ICD-10-CM | POA: Diagnosis not present

## 2022-12-23 DIAGNOSIS — G8918 Other acute postprocedural pain: Secondary | ICD-10-CM | POA: Diagnosis not present

## 2022-12-23 DIAGNOSIS — Z96652 Presence of left artificial knee joint: Secondary | ICD-10-CM | POA: Diagnosis not present

## 2022-12-23 DIAGNOSIS — Z471 Aftercare following joint replacement surgery: Secondary | ICD-10-CM | POA: Diagnosis not present

## 2022-12-24 DIAGNOSIS — E785 Hyperlipidemia, unspecified: Secondary | ICD-10-CM | POA: Diagnosis not present

## 2022-12-24 DIAGNOSIS — I959 Hypotension, unspecified: Secondary | ICD-10-CM | POA: Diagnosis not present

## 2022-12-24 DIAGNOSIS — I10 Essential (primary) hypertension: Secondary | ICD-10-CM | POA: Diagnosis not present

## 2022-12-24 DIAGNOSIS — M25562 Pain in left knee: Secondary | ICD-10-CM | POA: Diagnosis not present

## 2022-12-24 HISTORY — PX: KNEE ARTHROSCOPY: SHX127

## 2022-12-25 DIAGNOSIS — I959 Hypotension, unspecified: Secondary | ICD-10-CM | POA: Diagnosis not present

## 2022-12-25 DIAGNOSIS — I10 Essential (primary) hypertension: Secondary | ICD-10-CM | POA: Diagnosis not present

## 2022-12-25 DIAGNOSIS — E785 Hyperlipidemia, unspecified: Secondary | ICD-10-CM | POA: Diagnosis not present

## 2022-12-26 DIAGNOSIS — I959 Hypotension, unspecified: Secondary | ICD-10-CM | POA: Diagnosis not present

## 2022-12-26 DIAGNOSIS — E785 Hyperlipidemia, unspecified: Secondary | ICD-10-CM | POA: Diagnosis not present

## 2022-12-26 DIAGNOSIS — I10 Essential (primary) hypertension: Secondary | ICD-10-CM | POA: Diagnosis not present

## 2022-12-27 DIAGNOSIS — Z471 Aftercare following joint replacement surgery: Secondary | ICD-10-CM | POA: Diagnosis not present

## 2022-12-27 DIAGNOSIS — I10 Essential (primary) hypertension: Secondary | ICD-10-CM | POA: Diagnosis not present

## 2022-12-27 DIAGNOSIS — Z96652 Presence of left artificial knee joint: Secondary | ICD-10-CM | POA: Diagnosis not present

## 2022-12-27 DIAGNOSIS — K219 Gastro-esophageal reflux disease without esophagitis: Secondary | ICD-10-CM | POA: Diagnosis not present

## 2022-12-27 DIAGNOSIS — M1711 Unilateral primary osteoarthritis, right knee: Secondary | ICD-10-CM | POA: Diagnosis not present

## 2022-12-27 DIAGNOSIS — G8929 Other chronic pain: Secondary | ICD-10-CM | POA: Diagnosis not present

## 2022-12-27 DIAGNOSIS — E7800 Pure hypercholesterolemia, unspecified: Secondary | ICD-10-CM | POA: Diagnosis not present

## 2022-12-27 DIAGNOSIS — Z791 Long term (current) use of non-steroidal anti-inflammatories (NSAID): Secondary | ICD-10-CM | POA: Diagnosis not present

## 2022-12-27 DIAGNOSIS — Z7982 Long term (current) use of aspirin: Secondary | ICD-10-CM | POA: Diagnosis not present

## 2022-12-29 DIAGNOSIS — Z471 Aftercare following joint replacement surgery: Secondary | ICD-10-CM | POA: Diagnosis not present

## 2022-12-29 DIAGNOSIS — G8929 Other chronic pain: Secondary | ICD-10-CM | POA: Diagnosis not present

## 2022-12-29 DIAGNOSIS — I10 Essential (primary) hypertension: Secondary | ICD-10-CM | POA: Diagnosis not present

## 2022-12-29 DIAGNOSIS — E7800 Pure hypercholesterolemia, unspecified: Secondary | ICD-10-CM | POA: Diagnosis not present

## 2022-12-29 DIAGNOSIS — M1711 Unilateral primary osteoarthritis, right knee: Secondary | ICD-10-CM | POA: Diagnosis not present

## 2022-12-29 DIAGNOSIS — Z96652 Presence of left artificial knee joint: Secondary | ICD-10-CM | POA: Diagnosis not present

## 2022-12-29 DIAGNOSIS — K219 Gastro-esophageal reflux disease without esophagitis: Secondary | ICD-10-CM | POA: Diagnosis not present

## 2022-12-29 DIAGNOSIS — Z791 Long term (current) use of non-steroidal anti-inflammatories (NSAID): Secondary | ICD-10-CM | POA: Diagnosis not present

## 2022-12-29 DIAGNOSIS — Z7982 Long term (current) use of aspirin: Secondary | ICD-10-CM | POA: Diagnosis not present

## 2022-12-31 DIAGNOSIS — E7800 Pure hypercholesterolemia, unspecified: Secondary | ICD-10-CM | POA: Diagnosis not present

## 2022-12-31 DIAGNOSIS — Z471 Aftercare following joint replacement surgery: Secondary | ICD-10-CM | POA: Diagnosis not present

## 2022-12-31 DIAGNOSIS — G8929 Other chronic pain: Secondary | ICD-10-CM | POA: Diagnosis not present

## 2022-12-31 DIAGNOSIS — K219 Gastro-esophageal reflux disease without esophagitis: Secondary | ICD-10-CM | POA: Diagnosis not present

## 2022-12-31 DIAGNOSIS — Z791 Long term (current) use of non-steroidal anti-inflammatories (NSAID): Secondary | ICD-10-CM | POA: Diagnosis not present

## 2022-12-31 DIAGNOSIS — Z7982 Long term (current) use of aspirin: Secondary | ICD-10-CM | POA: Diagnosis not present

## 2022-12-31 DIAGNOSIS — I10 Essential (primary) hypertension: Secondary | ICD-10-CM | POA: Diagnosis not present

## 2022-12-31 DIAGNOSIS — M1711 Unilateral primary osteoarthritis, right knee: Secondary | ICD-10-CM | POA: Diagnosis not present

## 2022-12-31 DIAGNOSIS — Z96652 Presence of left artificial knee joint: Secondary | ICD-10-CM | POA: Diagnosis not present

## 2023-01-03 ENCOUNTER — Other Ambulatory Visit: Payer: Self-pay | Admitting: Family Medicine

## 2023-01-03 DIAGNOSIS — Z0000 Encounter for general adult medical examination without abnormal findings: Secondary | ICD-10-CM

## 2023-01-04 DIAGNOSIS — Z471 Aftercare following joint replacement surgery: Secondary | ICD-10-CM | POA: Diagnosis not present

## 2023-01-04 DIAGNOSIS — K219 Gastro-esophageal reflux disease without esophagitis: Secondary | ICD-10-CM | POA: Diagnosis not present

## 2023-01-04 DIAGNOSIS — I10 Essential (primary) hypertension: Secondary | ICD-10-CM | POA: Diagnosis not present

## 2023-01-04 DIAGNOSIS — Z791 Long term (current) use of non-steroidal anti-inflammatories (NSAID): Secondary | ICD-10-CM | POA: Diagnosis not present

## 2023-01-04 DIAGNOSIS — M1711 Unilateral primary osteoarthritis, right knee: Secondary | ICD-10-CM | POA: Diagnosis not present

## 2023-01-04 DIAGNOSIS — E7800 Pure hypercholesterolemia, unspecified: Secondary | ICD-10-CM | POA: Diagnosis not present

## 2023-01-04 DIAGNOSIS — Z7982 Long term (current) use of aspirin: Secondary | ICD-10-CM | POA: Diagnosis not present

## 2023-01-04 DIAGNOSIS — Z96652 Presence of left artificial knee joint: Secondary | ICD-10-CM | POA: Diagnosis not present

## 2023-01-04 DIAGNOSIS — G8929 Other chronic pain: Secondary | ICD-10-CM | POA: Diagnosis not present

## 2023-01-06 DIAGNOSIS — M25662 Stiffness of left knee, not elsewhere classified: Secondary | ICD-10-CM | POA: Diagnosis not present

## 2023-01-06 DIAGNOSIS — M25462 Effusion, left knee: Secondary | ICD-10-CM | POA: Diagnosis not present

## 2023-01-06 DIAGNOSIS — R2689 Other abnormalities of gait and mobility: Secondary | ICD-10-CM | POA: Diagnosis not present

## 2023-01-06 DIAGNOSIS — M25562 Pain in left knee: Secondary | ICD-10-CM | POA: Diagnosis not present

## 2023-01-12 DIAGNOSIS — M25462 Effusion, left knee: Secondary | ICD-10-CM | POA: Diagnosis not present

## 2023-01-12 DIAGNOSIS — M25562 Pain in left knee: Secondary | ICD-10-CM | POA: Diagnosis not present

## 2023-01-12 DIAGNOSIS — R2689 Other abnormalities of gait and mobility: Secondary | ICD-10-CM | POA: Diagnosis not present

## 2023-01-12 DIAGNOSIS — M25662 Stiffness of left knee, not elsewhere classified: Secondary | ICD-10-CM | POA: Diagnosis not present

## 2023-01-14 DIAGNOSIS — M25662 Stiffness of left knee, not elsewhere classified: Secondary | ICD-10-CM | POA: Diagnosis not present

## 2023-01-14 DIAGNOSIS — R2689 Other abnormalities of gait and mobility: Secondary | ICD-10-CM | POA: Diagnosis not present

## 2023-01-14 DIAGNOSIS — M25562 Pain in left knee: Secondary | ICD-10-CM | POA: Diagnosis not present

## 2023-01-14 DIAGNOSIS — M25462 Effusion, left knee: Secondary | ICD-10-CM | POA: Diagnosis not present

## 2023-01-18 DIAGNOSIS — M25662 Stiffness of left knee, not elsewhere classified: Secondary | ICD-10-CM | POA: Diagnosis not present

## 2023-01-18 DIAGNOSIS — M25462 Effusion, left knee: Secondary | ICD-10-CM | POA: Diagnosis not present

## 2023-01-18 DIAGNOSIS — M25562 Pain in left knee: Secondary | ICD-10-CM | POA: Diagnosis not present

## 2023-01-18 DIAGNOSIS — R2689 Other abnormalities of gait and mobility: Secondary | ICD-10-CM | POA: Diagnosis not present

## 2023-01-25 DIAGNOSIS — M25562 Pain in left knee: Secondary | ICD-10-CM | POA: Diagnosis not present

## 2023-01-25 DIAGNOSIS — R2689 Other abnormalities of gait and mobility: Secondary | ICD-10-CM | POA: Diagnosis not present

## 2023-01-25 DIAGNOSIS — M25662 Stiffness of left knee, not elsewhere classified: Secondary | ICD-10-CM | POA: Diagnosis not present

## 2023-01-25 DIAGNOSIS — M25462 Effusion, left knee: Secondary | ICD-10-CM | POA: Diagnosis not present

## 2023-01-27 DIAGNOSIS — M25662 Stiffness of left knee, not elsewhere classified: Secondary | ICD-10-CM | POA: Diagnosis not present

## 2023-01-27 DIAGNOSIS — M25462 Effusion, left knee: Secondary | ICD-10-CM | POA: Diagnosis not present

## 2023-01-27 DIAGNOSIS — R2689 Other abnormalities of gait and mobility: Secondary | ICD-10-CM | POA: Diagnosis not present

## 2023-01-27 DIAGNOSIS — M25562 Pain in left knee: Secondary | ICD-10-CM | POA: Diagnosis not present

## 2023-02-02 DIAGNOSIS — R2689 Other abnormalities of gait and mobility: Secondary | ICD-10-CM | POA: Diagnosis not present

## 2023-02-02 DIAGNOSIS — M25662 Stiffness of left knee, not elsewhere classified: Secondary | ICD-10-CM | POA: Diagnosis not present

## 2023-02-02 DIAGNOSIS — M25562 Pain in left knee: Secondary | ICD-10-CM | POA: Diagnosis not present

## 2023-02-02 DIAGNOSIS — M1712 Unilateral primary osteoarthritis, left knee: Secondary | ICD-10-CM | POA: Diagnosis not present

## 2023-02-02 DIAGNOSIS — M25462 Effusion, left knee: Secondary | ICD-10-CM | POA: Diagnosis not present

## 2023-02-04 DIAGNOSIS — R2689 Other abnormalities of gait and mobility: Secondary | ICD-10-CM | POA: Diagnosis not present

## 2023-02-04 DIAGNOSIS — M25562 Pain in left knee: Secondary | ICD-10-CM | POA: Diagnosis not present

## 2023-02-04 DIAGNOSIS — M25662 Stiffness of left knee, not elsewhere classified: Secondary | ICD-10-CM | POA: Diagnosis not present

## 2023-02-04 DIAGNOSIS — M25462 Effusion, left knee: Secondary | ICD-10-CM | POA: Diagnosis not present

## 2023-02-08 DIAGNOSIS — M25662 Stiffness of left knee, not elsewhere classified: Secondary | ICD-10-CM | POA: Diagnosis not present

## 2023-02-08 DIAGNOSIS — R2689 Other abnormalities of gait and mobility: Secondary | ICD-10-CM | POA: Diagnosis not present

## 2023-02-08 DIAGNOSIS — M25562 Pain in left knee: Secondary | ICD-10-CM | POA: Diagnosis not present

## 2023-02-08 DIAGNOSIS — M25462 Effusion, left knee: Secondary | ICD-10-CM | POA: Diagnosis not present

## 2023-02-14 DIAGNOSIS — R2689 Other abnormalities of gait and mobility: Secondary | ICD-10-CM | POA: Diagnosis not present

## 2023-02-14 DIAGNOSIS — M25562 Pain in left knee: Secondary | ICD-10-CM | POA: Diagnosis not present

## 2023-02-14 DIAGNOSIS — M25462 Effusion, left knee: Secondary | ICD-10-CM | POA: Diagnosis not present

## 2023-02-14 DIAGNOSIS — M25662 Stiffness of left knee, not elsewhere classified: Secondary | ICD-10-CM | POA: Diagnosis not present

## 2023-02-16 ENCOUNTER — Other Ambulatory Visit: Payer: Self-pay | Admitting: Family Medicine

## 2023-02-16 DIAGNOSIS — K219 Gastro-esophageal reflux disease without esophagitis: Secondary | ICD-10-CM

## 2023-02-17 DIAGNOSIS — R2689 Other abnormalities of gait and mobility: Secondary | ICD-10-CM | POA: Diagnosis not present

## 2023-02-17 DIAGNOSIS — M25662 Stiffness of left knee, not elsewhere classified: Secondary | ICD-10-CM | POA: Diagnosis not present

## 2023-02-17 DIAGNOSIS — M25562 Pain in left knee: Secondary | ICD-10-CM | POA: Diagnosis not present

## 2023-02-17 DIAGNOSIS — M25462 Effusion, left knee: Secondary | ICD-10-CM | POA: Diagnosis not present

## 2023-02-22 DIAGNOSIS — R2689 Other abnormalities of gait and mobility: Secondary | ICD-10-CM | POA: Diagnosis not present

## 2023-02-22 DIAGNOSIS — M25462 Effusion, left knee: Secondary | ICD-10-CM | POA: Diagnosis not present

## 2023-02-22 DIAGNOSIS — M25562 Pain in left knee: Secondary | ICD-10-CM | POA: Diagnosis not present

## 2023-02-22 DIAGNOSIS — M25662 Stiffness of left knee, not elsewhere classified: Secondary | ICD-10-CM | POA: Diagnosis not present

## 2023-02-23 ENCOUNTER — Ambulatory Visit
Admission: RE | Admit: 2023-02-23 | Discharge: 2023-02-23 | Disposition: A | Discharge status: Home / Self Care | Payer: Medicaid Other | Source: Ambulatory Visit | Attending: Family Medicine | Admitting: Family Medicine

## 2023-02-23 DIAGNOSIS — Z1231 Encounter for screening mammogram for malignant neoplasm of breast: Secondary | ICD-10-CM | POA: Diagnosis not present

## 2023-02-23 DIAGNOSIS — Z0000 Encounter for general adult medical examination without abnormal findings: Secondary | ICD-10-CM

## 2023-02-24 DIAGNOSIS — M25462 Effusion, left knee: Secondary | ICD-10-CM | POA: Diagnosis not present

## 2023-02-24 DIAGNOSIS — R2689 Other abnormalities of gait and mobility: Secondary | ICD-10-CM | POA: Diagnosis not present

## 2023-02-24 DIAGNOSIS — M25562 Pain in left knee: Secondary | ICD-10-CM | POA: Diagnosis not present

## 2023-02-24 DIAGNOSIS — M25662 Stiffness of left knee, not elsewhere classified: Secondary | ICD-10-CM | POA: Diagnosis not present

## 2023-03-02 DIAGNOSIS — M25662 Stiffness of left knee, not elsewhere classified: Secondary | ICD-10-CM | POA: Diagnosis not present

## 2023-03-02 DIAGNOSIS — R2689 Other abnormalities of gait and mobility: Secondary | ICD-10-CM | POA: Diagnosis not present

## 2023-03-02 DIAGNOSIS — M25562 Pain in left knee: Secondary | ICD-10-CM | POA: Diagnosis not present

## 2023-03-02 DIAGNOSIS — M25462 Effusion, left knee: Secondary | ICD-10-CM | POA: Diagnosis not present

## 2023-03-04 DIAGNOSIS — R2689 Other abnormalities of gait and mobility: Secondary | ICD-10-CM | POA: Diagnosis not present

## 2023-03-04 DIAGNOSIS — M25462 Effusion, left knee: Secondary | ICD-10-CM | POA: Diagnosis not present

## 2023-03-04 DIAGNOSIS — M25662 Stiffness of left knee, not elsewhere classified: Secondary | ICD-10-CM | POA: Diagnosis not present

## 2023-03-04 DIAGNOSIS — M25562 Pain in left knee: Secondary | ICD-10-CM | POA: Diagnosis not present

## 2023-03-07 ENCOUNTER — Ambulatory Visit: Payer: Medicaid Other

## 2023-03-07 VITALS — BP 104/68 | HR 78 | Temp 97.7°F | Ht 65.5 in | Wt 149.6 lb

## 2023-03-07 DIAGNOSIS — M419 Scoliosis, unspecified: Secondary | ICD-10-CM | POA: Diagnosis not present

## 2023-03-07 DIAGNOSIS — M47816 Spondylosis without myelopathy or radiculopathy, lumbar region: Secondary | ICD-10-CM | POA: Diagnosis not present

## 2023-03-07 DIAGNOSIS — G8929 Other chronic pain: Secondary | ICD-10-CM | POA: Diagnosis not present

## 2023-03-07 DIAGNOSIS — M545 Low back pain, unspecified: Secondary | ICD-10-CM

## 2023-03-07 DIAGNOSIS — M47814 Spondylosis without myelopathy or radiculopathy, thoracic region: Secondary | ICD-10-CM | POA: Diagnosis not present

## 2023-03-07 DIAGNOSIS — M438X6 Other specified deforming dorsopathies, lumbar region: Secondary | ICD-10-CM | POA: Diagnosis not present

## 2023-03-07 DIAGNOSIS — M549 Dorsalgia, unspecified: Secondary | ICD-10-CM

## 2023-03-07 DIAGNOSIS — M546 Pain in thoracic spine: Secondary | ICD-10-CM | POA: Diagnosis not present

## 2023-03-07 HISTORY — DX: Dorsalgia, unspecified: M54.9

## 2023-03-07 MED ORDER — METHOCARBAMOL 750 MG PO TABS: 750.0000 mg | ORAL_TABLET | Freq: Two times a day (BID) | ORAL | 0 refills | Status: AC | PRN

## 2023-03-07 NOTE — Patient Instructions (Addendum)
Ordered X rays of your mid back and lower back Sent a script for a muscle relaxant to your pharmacy to only take as needed Continue taking tylenol Will refer for physical therapy

## 2023-03-07 NOTE — Assessment & Plan Note (Signed)
chronic mid back pain and chronic low back pain without radiation. Most likely secondary to osteoarthritis versus degenerative disc disease No prior imaging  Plan: Will order x-rays of her lumbar spine and thoracic spine, as there is a small suspicion for scoliosis of her spine -Will send methocarbamol to her pharmacy to only take it as needed for severe back pain or stiffness She could continue taking Tylenol -Will place referral to physical therapy at the same place that she is doing physical therapy for her left knee status post TKR -Recommend yoga for low back pain. Report back if worsening symptoms
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
## 2023-03-09 DIAGNOSIS — M25462 Effusion, left knee: Secondary | ICD-10-CM | POA: Diagnosis not present

## 2023-03-09 DIAGNOSIS — M25662 Stiffness of left knee, not elsewhere classified: Secondary | ICD-10-CM | POA: Diagnosis not present

## 2023-03-09 DIAGNOSIS — R2689 Other abnormalities of gait and mobility: Secondary | ICD-10-CM | POA: Diagnosis not present

## 2023-03-09 DIAGNOSIS — M25562 Pain in left knee: Secondary | ICD-10-CM | POA: Diagnosis not present

## 2023-03-14 ENCOUNTER — Other Ambulatory Visit: Payer: Self-pay | Admitting: Family Medicine

## 2023-03-14 DIAGNOSIS — M25662 Stiffness of left knee, not elsewhere classified: Secondary | ICD-10-CM | POA: Diagnosis not present

## 2023-03-14 DIAGNOSIS — R2689 Other abnormalities of gait and mobility: Secondary | ICD-10-CM | POA: Diagnosis not present

## 2023-03-14 DIAGNOSIS — M25462 Effusion, left knee: Secondary | ICD-10-CM | POA: Diagnosis not present

## 2023-03-14 DIAGNOSIS — M25562 Pain in left knee: Secondary | ICD-10-CM | POA: Diagnosis not present

## 2023-03-23 DIAGNOSIS — R2689 Other abnormalities of gait and mobility: Secondary | ICD-10-CM | POA: Diagnosis not present

## 2023-03-23 DIAGNOSIS — M25562 Pain in left knee: Secondary | ICD-10-CM | POA: Diagnosis not present

## 2023-03-23 DIAGNOSIS — M25462 Effusion, left knee: Secondary | ICD-10-CM | POA: Diagnosis not present

## 2023-03-23 DIAGNOSIS — M25662 Stiffness of left knee, not elsewhere classified: Secondary | ICD-10-CM | POA: Diagnosis not present

## 2023-04-05 DIAGNOSIS — R2689 Other abnormalities of gait and mobility: Secondary | ICD-10-CM | POA: Diagnosis not present

## 2023-04-05 DIAGNOSIS — M25562 Pain in left knee: Secondary | ICD-10-CM | POA: Diagnosis not present

## 2023-04-05 DIAGNOSIS — M25662 Stiffness of left knee, not elsewhere classified: Secondary | ICD-10-CM | POA: Diagnosis not present

## 2023-04-05 DIAGNOSIS — M25462 Effusion, left knee: Secondary | ICD-10-CM | POA: Diagnosis not present

## 2023-04-22 DIAGNOSIS — M545 Low back pain, unspecified: Secondary | ICD-10-CM | POA: Diagnosis not present

## 2023-04-22 DIAGNOSIS — M2560 Stiffness of unspecified joint, not elsewhere classified: Secondary | ICD-10-CM | POA: Diagnosis not present

## 2023-04-27 DIAGNOSIS — M545 Low back pain, unspecified: Secondary | ICD-10-CM | POA: Diagnosis not present

## 2023-04-27 DIAGNOSIS — M2560 Stiffness of unspecified joint, not elsewhere classified: Secondary | ICD-10-CM | POA: Diagnosis not present

## 2023-05-04 DIAGNOSIS — M545 Low back pain, unspecified: Secondary | ICD-10-CM | POA: Diagnosis not present

## 2023-05-04 DIAGNOSIS — M2560 Stiffness of unspecified joint, not elsewhere classified: Secondary | ICD-10-CM | POA: Diagnosis not present

## 2023-05-16 DIAGNOSIS — M2560 Stiffness of unspecified joint, not elsewhere classified: Secondary | ICD-10-CM | POA: Diagnosis not present

## 2023-05-16 DIAGNOSIS — M545 Low back pain, unspecified: Secondary | ICD-10-CM | POA: Diagnosis not present

## 2023-05-21 ENCOUNTER — Other Ambulatory Visit: Payer: Self-pay | Admitting: Family Medicine

## 2023-05-24 ENCOUNTER — Other Ambulatory Visit: Payer: Self-pay | Admitting: Family Medicine

## 2023-05-24 DIAGNOSIS — K219 Gastro-esophageal reflux disease without esophagitis: Secondary | ICD-10-CM

## 2023-06-06 ENCOUNTER — Other Ambulatory Visit: Payer: Self-pay

## 2023-06-14 ENCOUNTER — Telehealth: Payer: Self-pay | Admitting: Family Medicine

## 2023-06-14 ENCOUNTER — Other Ambulatory Visit: Payer: Self-pay

## 2023-06-14 DIAGNOSIS — F419 Anxiety disorder, unspecified: Secondary | ICD-10-CM

## 2023-06-14 MED ORDER — SERTRALINE HCL 25 MG PO TABS
25.0000 mg | ORAL_TABLET | Freq: Every day | ORAL | 3 refills | Status: DC
Start: 2023-06-14 — End: 2023-09-22

## 2023-06-14 NOTE — Telephone Encounter (Signed)
Prescription Request  06/14/2023  LOV: 11/15/2022  What is the name of the medication or equipment? sertraline (ZOLOFT) 25 MG tablet     Which pharmacy would you like this sent to?  [facility] - [place], [place] - [address] [place] [address] Phone: [phone] Fax: [phone]    Patient notified that their request is being sent to the clinical staff for review and that they should receive a response within 2 business days.   Please advise at Mobile [phone] (mobile)

## 2023-06-22 DIAGNOSIS — M1712 Unilateral primary osteoarthritis, left knee: Secondary | ICD-10-CM | POA: Diagnosis not present

## 2023-07-04 DIAGNOSIS — M1711 Unilateral primary osteoarthritis, right knee: Secondary | ICD-10-CM | POA: Diagnosis not present

## 2023-07-04 DIAGNOSIS — M25461 Effusion, right knee: Secondary | ICD-10-CM | POA: Diagnosis not present

## 2023-07-04 DIAGNOSIS — M25561 Pain in right knee: Secondary | ICD-10-CM | POA: Diagnosis not present

## 2023-07-04 DIAGNOSIS — G8929 Other chronic pain: Secondary | ICD-10-CM | POA: Diagnosis not present

## 2023-07-28 DIAGNOSIS — M25561 Pain in right knee: Secondary | ICD-10-CM | POA: Diagnosis not present

## 2023-08-03 DIAGNOSIS — M23321 Other meniscus derangements, posterior horn of medial meniscus, right knee: Secondary | ICD-10-CM | POA: Diagnosis not present

## 2023-08-08 DIAGNOSIS — M1711 Unilateral primary osteoarthritis, right knee: Secondary | ICD-10-CM | POA: Diagnosis not present

## 2023-08-30 ENCOUNTER — Encounter: Payer: Self-pay | Admitting: Family Medicine

## 2023-08-30 ENCOUNTER — Ambulatory Visit: Payer: Medicaid Other | Admitting: Family Medicine

## 2023-08-30 VITALS — BP 110/72 | HR 77 | Temp 98.2°F | Ht 65.5 in | Wt 157.0 lb

## 2023-08-30 DIAGNOSIS — M25461 Effusion, right knee: Secondary | ICD-10-CM | POA: Diagnosis not present

## 2023-08-30 DIAGNOSIS — G8929 Other chronic pain: Secondary | ICD-10-CM | POA: Diagnosis not present

## 2023-08-30 DIAGNOSIS — M1711 Unilateral primary osteoarthritis, right knee: Secondary | ICD-10-CM | POA: Diagnosis not present

## 2023-08-30 DIAGNOSIS — R1319 Other dysphagia: Secondary | ICD-10-CM | POA: Diagnosis not present

## 2023-08-30 DIAGNOSIS — M25561 Pain in right knee: Secondary | ICD-10-CM | POA: Diagnosis not present

## 2023-08-30 NOTE — Progress Notes (Signed)
Acute Office Visit  Subjective:    Patient ID: Kathy Perry, female    DOB: [birth year], 63 y.o.   MRN: [id number]  Chief Complaint  Patient presents with   Dysphagia    Discussed the use of AI scribe software for clinical note transcription with the patient, who gave verbal consent to proceed.  HPI: The patient, with a history of esophageal dilation, presents with intermittent difficulty swallowing that has been slowly worsening. She describes the sensation as food 'getting stuck' in the mid chest. She denies weight loss, dyspnea, and regurgitation. She has a history of choking on saliva, which she attributes to a familial tendency. She has not seen a gastroenterologist in this area and it has been years since her last esophageal dilation.  The patient also reports a 'funny smell' upon waking, which has been present for months. She denies sinus tenderness, rhinorrhea, and sneezing. She also reports a persistent headache following a recent cortisone injection for knee pain. She has a history of knee replacement and is scheduled to see orthopedics for further evaluation.  Past Medical History:  Diagnosis Date   Anxiety    Arthritis    GERD (gastroesophageal reflux disease)    IBS (irritable bowel syndrome)    Need for Tdap vaccination 11/13/2019    Past Surgical History:  Procedure Laterality Date   BALLOON DILATION N/A 06/23/2021   Procedure: BALLOON DILATION;  Surgeon: Lanelle Bal, DO;  [facility];  Service: Endoscopy;  Laterality: N/A;   BIOPSY  06/23/2021   Procedure: BIOPSY;  Surgeon: Lanelle Bal, DO;  [facility];  Service: Endoscopy;;   BREAST EXCISIONAL BIOPSY     BREAST LUMPECTOMY WITH RADIOACTIVE SEED LOCALIZATION Left 02/28/2020   Procedure: LEFT BREAST LUMPECTOMY WITH RADIOACTIVE SEED LOCALIZATION;  Surgeon: Manus Rudd, MD;  Location: [facility];  Service: General;  Laterality: Left;  LMA    ESOPHAGOGASTRODUODENOSCOPY (EGD) WITH PROPOFOL N/A 06/23/2021   Procedure: ESOPHAGOGASTRODUODENOSCOPY (EGD) WITH PROPOFOL;  Surgeon: Lanelle Bal, DO;  [facility];  Service: Endoscopy;  Laterality: N/A;   TUBAL LIGATION     WISDOM TOOTH EXTRACTION      Family History  Problem Relation Age of Onset   Breast cancer Paternal Grandmother    Diabetes Father    Cancer Father        not sure primary   Memory loss Mother    Cancer Brother        thyroid   COPD Brother    Breast cancer Daughter    Breast cancer Paternal Aunt    Lung cancer Paternal Aunt    Colon cancer Neg Hx    Esophageal cancer Neg Hx    Gastric cancer Neg Hx     Social History   Socioeconomic History   Marital status: Married    Spouse name: Actor   Number of children: 3   Years of education: Not on file   Highest education level: 9th grade  Occupational History   Occupation: homemaker  Tobacco Use   Smoking status: Never   Smokeless tobacco: Never  Vaping Use   Vaping status: Never Used  Substance and Sexual Activity   Alcohol use: No   Drug use: Never   Sexual activity: Not Currently    Birth control/protection: Surgical, Post-menopausal    Comment: tubal  Other Topics Concern   Not on file  Social History Narrative   Lives with [facility] years  1 son, 2 daughters   4 grandchildren       Cat: Lucy   Dog: Roofus       Enjoys: walking with dog, flowers, sewing, iron       Diet: eats all food groups-limited meat    Caffeine: some coke    Water: 2-4 cups of water      Wears seat belt   Smoke detectors at home    Social Drivers of Health   Financial Resource Strain: Low Risk  (08/30/2023)   Overall Financial Resource Strain (CARDIA)    Difficulty of Paying Living Expenses: Not hard at all  Food Insecurity: No Food Insecurity (08/30/2023)   Hunger Vital Sign    Worried About Running Out of Food in the Last Year: Never true    Ran Out of Food in the Last Year: Never  true  Transportation Needs: No Transportation Needs (08/30/2023)   PRAPARE - Administrator, Civil Service (Medical): No    Lack of Transportation (Non-Medical): No  Physical Activity: Insufficiently Active (08/30/2023)   Exercise Vital Sign    Days of Exercise per Week: 7 days    Minutes of Exercise per Session: 20 min  Stress: No Stress Concern Present (08/30/2023)   [employer] of Occupational Health - Occupational Stress Questionnaire    Feeling of Stress : Only a little  Social Connections: Moderately Isolated (08/30/2023)   Social Connection and Isolation Panel [NHANES]    Frequency of Communication with Friends and Family: More than three times a week    Frequency of Social Gatherings with Friends and Family: Once a week    Attends Religious Services: Never    Database administrator or Organizations: No    Attends Banker Meetings: Never    Marital Status: Married  Catering manager Violence: Not At Risk (08/30/2023)   Humiliation, Afraid, Rape, and Kick questionnaire    Fear of Current or Ex-Partner: No    Emotionally Abused: No    Physically Abused: No    Sexually Abused: No    Outpatient Medications Prior to Visit  Medication Sig Dispense Refill   amLODipine (NORVASC) 5 MG tablet Take 1 tablet by mouth daily. 90 tablet 0   Aspirin-Acetaminophen-Caffeine (GOODYS EXTRA STRENGTH) 500-325-65 MG PACK Take 1 Package by mouth daily as needed (pain).     atorvastatin (LIPITOR) 40 MG tablet Take 1 tablet (40 mg total) by mouth daily. 90 tablet 1   celecoxib (CELEBREX) 200 MG capsule Take 200 mg by mouth 2 (two) times daily.     ondansetron (ZOFRAN) 4 MG tablet Take 1 tablet (4 mg total) by mouth every 8 (eight) hours as needed for nausea or vomiting. 30 tablet 3   sertraline (ZOLOFT) 25 MG tablet Take 1 tablet (25 mg total) by mouth daily. 30 tablet 3   VITAMIN D, ERGOCALCIFEROL, PO Take 125 mcg by mouth daily.     pantoprazole (PROTONIX) 40 MG tablet  TAKE ONE TABLET BY MOUTH TWICE DAILY before a meal 180 tablet 0   No facility-administered medications prior to visit.    Allergies  Allergen Reactions   Codeine Other (See Comments)    Chest pain   Lisinopril Cough    Review of Systems  Constitutional:  Negative for chills, fatigue and fever.  HENT:  Negative for congestion, ear pain, rhinorrhea and sore throat.   Respiratory:  Negative for cough and shortness of breath.   Cardiovascular:  Negative for chest pain.  Objective:        08/30/2023    8:31 AM 03/07/2023    1:35 PM 11/15/2022   11:07 AM  Vitals with BMI  Height 5' 5.5" 5' 5.5" 5' 5.5"  Weight 157 lbs 149 lbs 10 oz 150 lbs 6 oz  BMI 25.72 24.51 24.64  Systolic 110 104 161  Diastolic 72 68 64  Pulse 77 78 84    No data found.   Physical Exam Vitals reviewed.  Constitutional:      Appearance: Normal appearance. She is normal weight.  Cardiovascular:     Rate and Rhythm: Normal rate and regular rhythm.     Heart sounds: Normal heart sounds.  Pulmonary:     Effort: Pulmonary effort is normal. No respiratory distress.     Breath sounds: Normal breath sounds.  Abdominal:     General: Abdomen is flat. Bowel sounds are normal.     Palpations: Abdomen is soft.     Tenderness: There is no abdominal tenderness.  Lymphadenopathy:     Cervical: No cervical adenopathy.  Neurological:     Mental Status: She is alert and oriented to person, place, and time.  Psychiatric:        Mood and Affect: Mood normal.        Behavior: Behavior normal.     Health Maintenance Due  Topic Date Due   Pneumococcal Vaccine 30-49 Years old (1 of 2 - PCV) Never done   Zoster Vaccines- Shingrix (1 of 2) Never done    There are no preventive care reminders to display for this patient.   Lab Results  Component Value Date   TSH 0.765 11/15/2022   Lab Results  Component Value Date   WBC 5.3 11/15/2022   HGB 13.3 11/15/2022   HCT 41.7 11/15/2022   MCV 90 11/15/2022    PLT 153 11/15/2022   Lab Results  Component Value Date   NA 143 11/15/2022   K 5.3 (H) 11/15/2022   CO2 24 11/15/2022   GLUCOSE 85 11/15/2022   BUN 14 11/15/2022   CREATININE 1.18 (H) 11/15/2022   BILITOT 0.3 11/15/2022   ALKPHOS 84 11/15/2022   AST 17 11/15/2022   ALT 17 11/15/2022   PROT 6.8 11/15/2022   ALBUMIN 4.7 11/15/2022   CALCIUM 10.1 11/15/2022   ANIONGAP 13 12/13/2018   EGFR 53 (L) 11/15/2022   Lab Results  Component Value Date   CHOL 171 11/15/2022   Lab Results  Component Value Date   HDL 77 11/15/2022   Lab Results  Component Value Date   LDLCALC 76 11/15/2022   Lab Results  Component Value Date   TRIG 98 11/15/2022   Lab Results  Component Value Date   CHOLHDL 2.2 11/15/2022   Lab Results  Component Value Date   HGBA1C 5.4 11/25/2021       Assessment & Plan:  Esophageal dysphagia Assessment & Plan: Intermittent difficulty swallowing, more with solids than liquids. History of esophageal dilation years ago. No recent weight loss or food impaction. On Protonix twice daily for GERD. -Refer to Dr. Braulio Conte for gastroenterology consultation and possible esophagogastroduodenoscopy (EGD) with dilation. -Ordering a swallowing study pending gastroenterologist's recommendation.  Orders: -     Ambulatory referral to Gastroenterology -     SLP modified barium swallow; Future     No orders of the defined types were placed in this encounter.   Orders Placed This Encounter  Procedures   Ambulatory referral to Gastroenterology   SLP  modified barium swallow     Follow-up: Return if symptoms worsen or fail to improve.  An After Visit Summary was printed and given to the patient.   Clayborn Bigness I Leal-Borjas,acting as a scribe for Blane Ohara, MD.,have documented all relevant documentation on the behalf of Blane Ohara, MD,as directed by  Blane Ohara, MD while in the presence of Blane Ohara, MD.   I attest that I have reviewed this visit and  agree with the plan scribed by my staff.   Blane Ohara, MD [facility] [phone]

## 2023-08-31 ENCOUNTER — Other Ambulatory Visit: Payer: Self-pay

## 2023-08-31 ENCOUNTER — Encounter: Payer: Self-pay | Admitting: Family Medicine

## 2023-08-31 DIAGNOSIS — K219 Gastro-esophageal reflux disease without esophagitis: Secondary | ICD-10-CM

## 2023-08-31 MED ORDER — PANTOPRAZOLE SODIUM 40 MG PO TBEC: DELAYED_RELEASE_TABLET | ORAL | 0 refills | Status: DC

## 2023-08-31 NOTE — Telephone Encounter (Signed)
Refill sent to pharmacy.

## 2023-09-01 ENCOUNTER — Other Ambulatory Visit (HOSPITAL_COMMUNITY): Payer: Self-pay | Admitting: *Deleted

## 2023-09-01 DIAGNOSIS — R1310 Dysphagia, unspecified: Secondary | ICD-10-CM

## 2023-09-03 NOTE — Assessment & Plan Note (Addendum)
Intermittent difficulty swallowing, more with solids than liquids. History of esophageal dilation years ago. No recent weight loss or food impaction. On Protonix twice daily for GERD. -Refer to Dr. Braulio Conte for gastroenterology consultation and possible esophagogastroduodenoscopy (EGD) with dilation. -Ordering a swallowing study pending gastroenterologist's recommendation.

## 2023-09-05 ENCOUNTER — Ambulatory Visit: Payer: Medicaid Other | Admitting: Family Medicine

## 2023-09-05 ENCOUNTER — Ambulatory Visit (INDEPENDENT_AMBULATORY_CARE_PROVIDER_SITE_OTHER): Payer: Medicaid Other

## 2023-09-05 DIAGNOSIS — Z23 Encounter for immunization: Secondary | ICD-10-CM

## 2023-09-05 NOTE — Progress Notes (Signed)
Patient presented for both flu and pneumonia vaccines. See immunization for additional info.

## 2023-09-08 ENCOUNTER — Other Ambulatory Visit: Payer: Self-pay | Admitting: Family Medicine

## 2023-09-08 ENCOUNTER — Encounter (HOSPITAL_COMMUNITY): Payer: Self-pay

## 2023-09-08 DIAGNOSIS — R1319 Other dysphagia: Secondary | ICD-10-CM

## 2023-09-13 ENCOUNTER — Other Ambulatory Visit: Payer: Self-pay

## 2023-09-19 ENCOUNTER — Encounter (HOSPITAL_COMMUNITY): Payer: Medicaid Other

## 2023-09-21 ENCOUNTER — Other Ambulatory Visit: Payer: Self-pay | Admitting: Family Medicine

## 2023-09-21 DIAGNOSIS — F419 Anxiety disorder, unspecified: Secondary | ICD-10-CM

## 2023-10-04 ENCOUNTER — Other Ambulatory Visit (HOSPITAL_COMMUNITY)

## 2023-10-18 ENCOUNTER — Ambulatory Visit: Payer: Medicaid Other

## 2023-10-18 VITALS — BP 94/60 | HR 76 | Temp 98.6°F | Resp 14 | Ht 65.5 in | Wt 158.0 lb

## 2023-10-18 DIAGNOSIS — E663 Overweight: Secondary | ICD-10-CM

## 2023-10-18 DIAGNOSIS — M1711 Unilateral primary osteoarthritis, right knee: Secondary | ICD-10-CM | POA: Insufficient documentation

## 2023-10-18 DIAGNOSIS — Z01818 Encounter for other preprocedural examination: Secondary | ICD-10-CM | POA: Diagnosis not present

## 2023-10-18 DIAGNOSIS — E782 Mixed hyperlipidemia: Secondary | ICD-10-CM

## 2023-10-18 DIAGNOSIS — K219 Gastro-esophageal reflux disease without esophagitis: Secondary | ICD-10-CM | POA: Diagnosis not present

## 2023-10-18 DIAGNOSIS — N1831 Chronic kidney disease, stage 3a: Secondary | ICD-10-CM

## 2023-10-18 HISTORY — DX: Unilateral primary osteoarthritis, right knee: M17.11

## 2023-10-18 NOTE — Assessment & Plan Note (Signed)
 Severe right knee osteoarthritis confirmed by x-ray with bone-on-bone contact. She is scheduled for right knee replacement surgery. She previously underwent left knee replacement in June 2024, currently sore due to recent physical activity. She is physically active, aiding postoperative recovery. She prefers to avoid opioids postoperatively due to adverse effects on blood pressure. Postoperative management will include non-opioid analgesics and physical therapy. Risk of blood clots post-surgery is acknowledged. - Schedule right knee replacement surgery at the end of April 2025 - Coordinate with Dr. Deberah Castle for surgery at [facility] - Manage postoperative pain with non-opioid analgesics such as acetaminophen or ibuprofen - Emphasize the importance of postoperative physical therapy for recovery - Ensure postoperative blood clot prevention measures are in place

## 2023-10-18 NOTE — Assessment & Plan Note (Addendum)
 Her GUPTA peri operative cardiac risk score is 0.1% which is low.  HER REVISED CARDIAC RISK INDEX score is 0 which is low.   She is at 4 or more METS for activity without cardiopulmonary symptoms  Overall, she is at LOW RISK for complications for this low risk orthopedic procedure.

## 2023-10-18 NOTE — Assessment & Plan Note (Signed)
 Generally active. More activity limited due to right knee pain. Hopefully weight management will improve after knee replacement

## 2023-10-18 NOTE — Progress Notes (Signed)
 Subjective:  Patient ID: Kathy Perry, female    DOB: [birth year]  Age: 63 y.o. MRN: [id number]  Chief Complaint  Patient presents with   Pre-op Exam    Discussed the use of AI scribe software for clinical note transcription with the patient, who gave verbal consent to proceed.    [name] is a 63 year old female who presents for evaluation for RIGHT knee replacement surgery,   She has severe osteoarthritis in the right knee, confirmed by x-ray showing bone-on-bone contact. She previously underwent a left knee replacement on December 24, 2022, and the left knee remains sore, particularly after moving to a new apartment which involved frequent use of stairs. She completed adequate physical therapy post-surgery and has almost full range of motion in the left knee, though it is slightly inflexible due to recent physical activity.  She has a history of experiencing low blood pressure post-operatively with the use of oxycodone and Tylenol, which led to prolonged hospitalization after her previous knee surgery. She did not use the prescribed oxycodone at home, opting for Tylenol and an ice machine instead.  She experiences anxiety and sleep disturbances, particularly over the last several months, exacerbated by family stressors. She takes sertraline 25 mg daily for anxiety but reports difficulty sleeping, often staying up late engaged in activities to avoid going to bed.  Her current medications include amlodipine 5 mg daily for blood pressure, atorvastatin 40 mg daily for cholesterol, Protonix 40 mg daily for acid reflux, and vitamin D. She has allergies to codeine and lisinopril. Her past medical history includes a breast lumpectomy, tubal ligation, mild to moderate mitral valve regurgitation identified five to six years ago, and a history of gestational diabetes. She is physically active, walking her 71-pound dog regularly, and reports no smoking or alcohol use.     08/30/2023     8:35 AM 03/07/2023    1:41 PM 11/15/2022   11:13 AM 05/03/2022   11:02 AM 01/26/2022    3:22 PM  Depression screen PHQ 2/9  Decreased Interest 0 0 0 1 1  Down, Depressed, Hopeless 1 1 1 1 1   PHQ - 2 Score 1 1 1 2 2   Altered sleeping 2 1 0 1 1  Tired, decreased energy 2 3 3 3 3   Change in appetite 2 3 0 0 0  Feeling bad or failure about yourself  0 1 1 0 0  Trouble concentrating 0 0 0 0 0  Moving slowly or fidgety/restless 0 0 1 0 0  Suicidal thoughts 0 0 0 0 0  PHQ-9 Score 7 9 6 6 6   Difficult doing work/chores Not difficult at all Not difficult at all Somewhat difficult Not difficult at all Somewhat difficult        03/07/2023    1:41 PM  Fall Risk   Falls in the past year? 0  Number falls in past yr: 0  Injury with Fall? 0  Risk for fall due to : No Fall Risks  Follow up Falls evaluation completed    Patient Care Team: Blane Ohara, MD as PCP - General (Family Medicine) Laqueta Linden, MD (Inactive) as PCP - Cardiology (Cardiology) West Bali, MD (Inactive) as Consulting Physician (Gastroenterology) Lanelle Bal, DO as Consulting Physician (Internal Medicine)   Review of Systems  Constitutional:  Negative for chills, fatigue and fever.  HENT:  Negative for congestion, ear pain and sore throat.   Respiratory:  Negative for cough and shortness  of breath.   Cardiovascular:  Negative for chest pain and palpitations.  Gastrointestinal:  Negative for abdominal pain, constipation, diarrhea, nausea and vomiting.  Endocrine: Negative for polydipsia, polyphagia and polyuria.  Genitourinary:  Negative for difficulty urinating and dysuria.  Musculoskeletal:  Positive for arthralgias (right knee pain). Negative for back pain and myalgias.  Skin:  Negative for rash.  Neurological:  Negative for headaches.  Psychiatric/Behavioral:  Positive for sleep disturbance. Negative for dysphoric mood. The patient is not nervous/anxious.     Current Outpatient Medications on  File Prior to Visit  Medication Sig Dispense Refill   amLODipine (NORVASC) 5 MG tablet Take 1 tablet by mouth daily. 90 tablet 0   Aspirin-Acetaminophen-Caffeine (GOODYS EXTRA STRENGTH) 500-325-65 MG PACK Take 1 Package by mouth daily as needed (pain).     atorvastatin (LIPITOR) 40 MG tablet Take 1 tablet (40 mg total) by mouth daily. 90 tablet 1   celecoxib (CELEBREX) 200 MG capsule Take 200 mg by mouth 2 (two) times daily.     pantoprazole (PROTONIX) 40 MG tablet TAKE ONE TABLET BY MOUTH TWICE DAILY before a meal 180 tablet 0   sertraline (ZOLOFT) 25 MG tablet Take 1 tablet (25 mg total) by mouth daily. 30 tablet 3   VITAMIN D, ERGOCALCIFEROL, PO Take 125 mcg by mouth daily.     No current facility-administered medications on file prior to visit.   Past Medical History:  Diagnosis Date   Anxiety    Arthritis    GERD (gastroesophageal reflux disease)    IBS (irritable bowel syndrome)    Need for Tdap vaccination 11/13/2019   Past Surgical History:  Procedure Laterality Date   BALLOON DILATION N/A 06/23/2021   Procedure: BALLOON DILATION;  Surgeon: Lanelle Bal, DO;  Location: [facility];  Service: Endoscopy;  Laterality: N/A;   BIOPSY  06/23/2021   Procedure: BIOPSY;  Surgeon: Lanelle Bal, DO;  Location: [facility];  Service: Endoscopy;;   BREAST EXCISIONAL BIOPSY     BREAST LUMPECTOMY WITH RADIOACTIVE SEED LOCALIZATION Left 02/28/2020   Procedure: LEFT BREAST LUMPECTOMY WITH RADIOACTIVE SEED LOCALIZATION;  Surgeon: Manus Rudd, MD;  Location: [facility];  Service: General;  Laterality: Left;  LMA   ESOPHAGOGASTRODUODENOSCOPY (EGD) WITH PROPOFOL N/A 06/23/2021   Procedure: ESOPHAGOGASTRODUODENOSCOPY (EGD) WITH PROPOFOL;  Surgeon: Lanelle Bal, DO;  Location: [facility];  Service: Endoscopy;  Laterality: N/A;   KNEE ARTHROSCOPY Left 12/24/2022   TUBAL LIGATION     WISDOM TOOTH EXTRACTION      Family History  Problem Relation Age of Onset    Breast cancer Paternal Grandmother    Diabetes Father    Cancer Father        not sure primary   Memory loss Mother    Cancer Brother        thyroid   COPD Brother    Breast cancer Daughter    Breast cancer Paternal Aunt    Lung cancer Paternal Aunt    Colon cancer Neg Hx    Esophageal cancer Neg Hx    Gastric cancer Neg Hx    Social History   Socioeconomic History   Marital status: Married    Spouse name: Virl Diamond   Number of children: 3   Years of education: Not on file   Highest education level: 9th grade  Occupational History   Occupation: homemaker  Tobacco Use   Smoking status: Never   Smokeless tobacco: Never  Vaping Use  Vaping status: Never Used  Substance and Sexual Activity   Alcohol use: No   Drug use: Never   Sexual activity: Not Currently    Birth control/protection: Surgical, Post-menopausal    Comment: tubal  Other Topics Concern   Not on file  Social History Narrative   Lives with [facility] years   1 son, 2 daughters   4 grandchildren       Cat: Lucy   Dog: Roofus       Enjoys: walking with dog, flowers, sewing, iron       Diet: eats all food groups-limited meat    Caffeine: some coke    Water: 2-4 cups of water      Wears seat belt   Smoke detectors at home    Social Drivers of Health   Financial Resource Strain: Low Risk  (08/30/2023)   Overall Financial Resource Strain (CARDIA)    Difficulty of Paying Living Expenses: Not hard at all  Food Insecurity: No Food Insecurity (08/30/2023)   Hunger Vital Sign    Worried About Running Out of Food in the Last Year: Never true    Ran Out of Food in the Last Year: Never true  Transportation Needs: No Transportation Needs (08/30/2023)   PRAPARE - Administrator, Civil Service (Medical): No    Lack of Transportation (Non-Medical): No  Physical Activity: Insufficiently Active (08/30/2023)   Exercise Vital Sign    Days of Exercise per Week: 7 days    Minutes of Exercise per  Session: 20 min  Stress: No Stress Concern Present (08/30/2023)   [employer] of Occupational Health - Occupational Stress Questionnaire    Feeling of Stress : Only a little  Social Connections: Moderately Isolated (08/30/2023)   Social Connection and Isolation Panel [NHANES]    Frequency of Communication with Friends and Family: More than three times a week    Frequency of Social Gatherings with Friends and Family: Once a week    Attends Religious Services: Never    Diplomatic Services operational officer: No    Attends Engineer, structural: Never    Marital Status: Married    Objective:  BP 94/60   Pulse 76   Temp 98.6 F (37 C)   Resp 14   Ht 5' 5.5" (1.664 m)   Wt 158 lb (71.7 kg)   LMP  (LMP Unknown)   SpO2 97%   BMI 25.89 kg/m      10/18/2023    1:59 PM 08/30/2023    8:31 AM 03/07/2023    1:35 PM  BP/Weight  Systolic BP 94 110 104  Diastolic BP 60 72 68  Wt. (Lbs) 158 157 149.6  BMI 25.89 kg/m2 25.73 kg/m2 24.52 kg/m2    Physical Exam Vitals reviewed.  Constitutional:      Appearance: Normal appearance.  HENT:     Head: Normocephalic and atraumatic.     Nose: Nose normal.  Eyes:     Pupils: Pupils are equal, round, and reactive to light.  Cardiovascular:     Rate and Rhythm: Normal rate and regular rhythm.  Pulmonary:     Effort: Pulmonary effort is normal.     Breath sounds: Normal breath sounds.  Musculoskeletal:        General: Tenderness (mild right knee tenderness) present. No swelling.     Cervical back: Normal range of motion.  Neurological:     General: No focal deficit present.  Mental Status: She is alert.  Psychiatric:        Mood and Affect: Mood normal.     Diabetic Foot Exam - Simple   No data filed      Lab Results  Component Value Date   WBC 5.3 11/15/2022   HGB 13.3 11/15/2022   HCT 41.7 11/15/2022   PLT 153 11/15/2022   GLUCOSE 85 11/15/2022   CHOL 171 11/15/2022   TRIG 98 11/15/2022   HDL 77 11/15/2022    LDLCALC 76 11/15/2022   ALT 17 11/15/2022   AST 17 11/15/2022   NA 143 11/15/2022   K 5.3 (H) 11/15/2022   CL 103 11/15/2022   CREATININE 1.18 (H) 11/15/2022   BUN 14 11/15/2022   CO2 24 11/15/2022   TSH 0.765 11/15/2022   INR 0.9 11/15/2022   HGBA1C 5.4 11/25/2021      Assessment & Plan:  Assessment and Plan       Preoperative clearance Assessment & Plan: Her GUPTA peri operative cardiac risk score is 0.1% which is low.  HER REVISED CARDIAC RISK INDEX score is 0 which is low.   She is at 4 or more METS for activity without cardiopulmonary symptoms  Overall, she is at LOW RISK for complications for this low risk orthopedic procedure.   Orders: -     CBC with Differential/Platelet -     Comprehensive metabolic panel with GFR -     Lipid panel -     Protime-INR  Primary osteoarthritis of right knee Assessment & Plan: Severe right knee osteoarthritis confirmed by x-ray with bone-on-bone contact. She is scheduled for right knee replacement surgery. She previously underwent left knee replacement in June 2024, currently sore due to recent physical activity. She is physically active, aiding postoperative recovery. She prefers to avoid opioids postoperatively due to adverse effects on blood pressure. Postoperative management will include non-opioid analgesics and physical therapy. Risk of blood clots post-surgery is acknowledged. - Schedule right knee replacement surgery at the end of April 2025 - Coordinate with Dr. Deberah Castle for surgery at [facility] - Manage postoperative pain with non-opioid analgesics such as acetaminophen or ibuprofen - Emphasize the importance of postoperative physical therapy for recovery - Ensure postoperative blood clot prevention measures are in place   Overweight (BMI 25.0-29.9) Assessment & Plan: Generally active. More activity limited due to right knee pain. Hopefully weight management will improve after knee replacement   Gastroesophageal  reflux disease, unspecified whether esophagitis present Assessment & Plan: The current medical regimen is effective;  continue present plan and medications. Continue pantoprazole 40 mg twice daily.    Mixed hyperlipidemia Assessment & Plan: Well controlled.  No changes to medicines. Continue atorvastatin 40 mg before bed.  Continue to work on eating a healthy diet and exercise.  Labs drawn today.     Stage 3a chronic kidney disease (HCC) Assessment & Plan: Chronic kidney disease stage 3 with slightly reduced kidney function. No current indication for dialysis, but monitoring is necessary. - Order repeat blood work to check electrolytes and kidney function  Experiences anxiety and insomnia, particularly over the last several months. Currently on sertraline 25 mg daily but does not wish to increase the dose due to previous adverse effects. Reports difficulty initiating and maintaining sleep, often engaging in activities to avoid going to bed. - Recommend melatonin for sleep - Advise on sleep hygiene, including removing electronics from the bedroom and maintaining a dark environment      No orders of the  defined types were placed in this encounter.   Orders Placed This Encounter  Procedures   CBC with Differential/Platelet   Comprehensive metabolic panel with GFR   Lipid panel   Protime-INR     Follow-up: No follow-ups on file. An After Visit Summary was printed and given to the patient.  Windell Moment, MD [facility] [phone]

## 2023-10-18 NOTE — Assessment & Plan Note (Signed)
Well controlled.  No changes to medicines. Continue atorvastatin 40 mg before bed.  Continue to work on eating a healthy diet and exercise.  Labs drawn today.   

## 2023-10-18 NOTE — Assessment & Plan Note (Addendum)
 Chronic kidney disease stage 3 with slightly reduced kidney function. No current indication for dialysis, but monitoring is necessary. - Order repeat blood work to check electrolytes and kidney function  Experiences anxiety and insomnia, particularly over the last several months. Currently on sertraline 25 mg daily but does not wish to increase the dose due to previous adverse effects. Reports difficulty initiating and maintaining sleep, often engaging in activities to avoid going to bed. - Recommend melatonin for sleep - Advise on sleep hygiene, including removing electronics from the bedroom and maintaining a dark environment

## 2023-10-18 NOTE — Assessment & Plan Note (Signed)
The current medical regimen is effective;  continue present plan and medications. Continue pantoprazole 40 mg twice daily.  

## 2023-10-19 LAB — COMPREHENSIVE METABOLIC PANEL WITH GFR
ALT: 11 IU/L (ref 0–32)
AST: 16 IU/L (ref 0–40)
Albumin: 4.5 g/dL (ref 3.9–4.9)
Alkaline Phosphatase: 101 IU/L (ref 44–121)
BUN/Creatinine Ratio: 14 (ref 12–28)
BUN: 17 mg/dL (ref 8–27)
Bilirubin Total: 0.2 mg/dL (ref 0.0–1.2)
CO2: 25 mmol/L (ref 20–29)
Calcium: 9.4 mg/dL (ref 8.7–10.3)
Chloride: 105 mmol/L (ref 96–106)
Creatinine, Ser: 1.18 mg/dL — ABNORMAL HIGH (ref 0.57–1.00)
Globulin, Total: 2.2 g/dL (ref 1.5–4.5)
Glucose: 84 mg/dL (ref 70–99)
Potassium: 4.5 mmol/L (ref 3.5–5.2)
Sodium: 143 mmol/L (ref 134–144)
Total Protein: 6.7 g/dL (ref 6.0–8.5)
eGFR: 52 mL/min/{1.73_m2} — ABNORMAL LOW (ref >59)

## 2023-10-19 LAB — LIPID PANEL
Chol/HDL Ratio: 2.2 ratio (ref 0.0–4.4)
Cholesterol, Total: 143 mg/dL (ref 100–199)
HDL: 65 mg/dL (ref >39)
LDL Chol Calc (NIH): 52 mg/dL (ref 0–99)
Triglycerides: 153 mg/dL — ABNORMAL HIGH (ref 0–149)
VLDL Cholesterol Cal: 26 mg/dL (ref 5–40)

## 2023-10-19 LAB — CBC WITH DIFFERENTIAL/PLATELET
Basophils Absolute: 0.1 10*3/uL (ref 0.0–0.2)
Basos: 2 %
EOS (ABSOLUTE): 0.2 10*3/uL (ref 0.0–0.4)
Eos: 5 %
Hematocrit: 39.3 % (ref 34.0–46.6)
Hemoglobin: 12.5 g/dL (ref 11.1–15.9)
Immature Grans (Abs): 0 10*3/uL (ref 0.0–0.1)
Immature Granulocytes: 0 %
Lymphocytes Absolute: 1.3 10*3/uL (ref 0.7–3.1)
Lymphs: 26 %
MCH: 27.8 pg (ref 26.6–33.0)
MCHC: 31.8 g/dL (ref 31.5–35.7)
MCV: 88 fL (ref 79–97)
Monocytes Absolute: 0.4 10*3/uL (ref 0.1–0.9)
Monocytes: 7 %
Neutrophils Absolute: 3 10*3/uL (ref 1.4–7.0)
Neutrophils: 60 %
Platelets: 179 10*3/uL (ref 150–450)
RBC: 4.49 x10E6/uL (ref 3.77–5.28)
RDW: 12.8 % (ref 11.7–15.4)
WBC: 5 10*3/uL (ref 3.4–10.8)

## 2023-10-19 LAB — PROTIME-INR
INR: 0.9 (ref 0.9–1.2)
Prothrombin Time: 10.4 s (ref 9.1–12.0)

## 2023-10-21 ENCOUNTER — Ambulatory Visit (HOSPITAL_COMMUNITY)
Admission: RE | Admit: 2023-10-21 | Discharge: 2023-10-21 | Disposition: A | Discharge status: Home / Self Care | Source: Ambulatory Visit | Attending: Family Medicine | Admitting: Family Medicine

## 2023-10-21 DIAGNOSIS — K449 Diaphragmatic hernia without obstruction or gangrene: Secondary | ICD-10-CM | POA: Diagnosis not present

## 2023-10-21 DIAGNOSIS — R1319 Other dysphagia: Secondary | ICD-10-CM | POA: Diagnosis not present

## 2023-10-21 DIAGNOSIS — R1310 Dysphagia, unspecified: Secondary | ICD-10-CM | POA: Diagnosis not present

## 2023-10-24 ENCOUNTER — Encounter: Payer: Self-pay | Admitting: Family Medicine

## 2023-10-27 DIAGNOSIS — Z01818 Encounter for other preprocedural examination: Secondary | ICD-10-CM | POA: Diagnosis not present

## 2023-11-09 DIAGNOSIS — M1711 Unilateral primary osteoarthritis, right knee: Secondary | ICD-10-CM | POA: Diagnosis not present

## 2023-11-09 DIAGNOSIS — Z01818 Encounter for other preprocedural examination: Secondary | ICD-10-CM | POA: Diagnosis not present

## 2023-11-14 ENCOUNTER — Ambulatory Visit: Payer: Self-pay

## 2023-11-14 NOTE — Telephone Encounter (Signed)
 Summary: sore throat   Copied From CRM [id number]. Reason for Triage: sore throat no pain . Also would like to know what is safe medication to take for it .     Chief Complaint: sore throat Symptoms: sore throat, redness, dry & raw feeling Frequency: yesterday Pertinent Negatives: Patient denies fever Disposition: [] ED /[] Urgent Care (no appt availability in office) / [x] Appointment(In office/virtual)/ []  [facility]/ [] Home Care/ [] Refused Recommended Disposition /[] [facility]/ []  Follow-up with PCP Additional Notes: gets dry and feels raw: comes and goes.  Pt states sometimes it makes her feel like she may gag.  Reason for Disposition  SEVERE (e.g., excruciating) throat pain  Answer Assessment - Initial Assessment Questions 1. ONSET: "When did the throat start hurting?" (Hours or days ago)      yesterday 2. SEVERITY: "How bad is the sore throat?" (Scale 1-10; mild, moderate or severe)   - MILD (1-3):  Doesn't interfere with eating or normal activities.   - MODERATE (4-7): Interferes with eating some solids and normal activities.   - SEVERE (8-10):  Excruciating pain, interferes with most normal activities.   - SEVERE WITH DYSPHAGIA (10): Can't swallow liquids, drooling.     10/10 3. STREP EXPOSURE: "Has there been any exposure to strep within the past week?" If Yes, ask: "What type of contact occurred?"      no 4.  VIRAL SYMPTOMS: "Are there any symptoms of a cold, such as a runny nose, cough, hoarse voice or red eyes?"      Redness on sides of throat, throat dry, feels raw, stuffy nose 5. FEVER: "Do you have a fever?" If Yes, ask: "What is your temperature, how was it measured, and when did it start?"     no 6. PUS ON THE TONSILS: "Is there pus on the tonsils in the back of your throat?"     no 7. OTHER SYMPTOMS: "Do you have any other symptoms?" (e.g., difficulty breathing, headache, rash)     no 8. PREGNANCY: "Is there any chance you are pregnant?" "When  was your last menstrual period?"     N/a  Protocols used: Sore Throat-A-AH

## 2023-11-15 ENCOUNTER — Ambulatory Visit

## 2023-11-15 VITALS — BP 106/86 | HR 98 | Temp 98.0°F | Ht 65.5 in | Wt 154.8 lb

## 2023-11-15 DIAGNOSIS — R6889 Other general symptoms and signs: Secondary | ICD-10-CM | POA: Diagnosis not present

## 2023-11-15 DIAGNOSIS — J029 Acute pharyngitis, unspecified: Secondary | ICD-10-CM | POA: Diagnosis not present

## 2023-11-15 LAB — POCT RAPID STREP A (OFFICE): Rapid Strep A Screen: POSITIVE — AB

## 2023-11-15 LAB — POCT INFLUENZA A/B
Influenza A, POC: NEGATIVE
Influenza B, POC: NEGATIVE

## 2023-11-15 LAB — POC COVID19 BINAXNOW: SARS Coronavirus 2 Ag: NEGATIVE

## 2023-11-15 MED ORDER — AMOXICILLIN 500 MG PO CAPS: 500.0000 mg | ORAL_CAPSULE | Freq: Three times a day (TID) | ORAL | 0 refills | Status: AC

## 2023-11-15 NOTE — Assessment & Plan Note (Signed)
 Acute streptococcal pharyngitis confirmed by positive strep test. Symptoms include dry throat, cough, and body aches since Saturday. No fever reported. Examination reveals no significant throat redness or lymphadenopathy. Viral infection ruled out due to positive strep test. Scheduled for right knee replacement surgery, necessitating prompt treatment to avoid complications. Amoxicillin chosen due to allergies to codeine and lisinopril . - Prescribe amoxicillin 500 mg three times daily for 10 days (30 tablets). - Advise to take amoxicillin until the night before surgery and resume after surgery. - Instruct to maintain hydration and perform saline gargles.

## 2023-11-15 NOTE — Progress Notes (Signed)
 Acute Office Visit  Subjective:    Patient ID: Kathy Perry, female    DOB: 1961-04-23, 63 y.o.   MRN: [id number]  Chief Complaint  Patient presents with   Sore Throat    HPI: Discussed the use of AI scribe software for clinical note transcription with the patient, who gave verbal consent to proceed.  History of Present Illness   Kathy Perry is a 63 year old female who presents with symptoms of a sore throat, dry throat, flu like symptoms for 3 days, and she has her upcoming right knee replacement surgery in 2 days from now.   Symptoms began on Saturday with a dry throat that worsens with drinking fluids, leading to gagging. She describes her throat as 'prickly' and notes that it was significantly worse yesterday compared to today. She has been experiencing fatigue, 'prickly heats' or hot flashes, and headaches. She is unsure if she has had a fever as she does not have a thermometer.  She reports a cough that started significantly today and a stuffy nose. She has not taken any over-the-counter medications due to concerns about interactions with her upcoming surgery.  No high fever due to lack of a thermometer. Dry throat, cough, stuffy nose, and body aches..         Past Surgical History:  Procedure Laterality Date   BALLOON DILATION N/A 06/23/2021   Procedure: BALLOON DILATION;  Surgeon: Vinetta Greening, DO;  Location: [facility];  Service: Endoscopy;  Laterality: N/A;   BIOPSY  06/23/2021   Procedure: BIOPSY;  Surgeon: Vinetta Greening, DO;  Location: [facility];  Service: Endoscopy;;   BREAST EXCISIONAL BIOPSY     BREAST LUMPECTOMY WITH RADIOACTIVE SEED LOCALIZATION Left 02/28/2020   Procedure: LEFT BREAST LUMPECTOMY WITH RADIOACTIVE SEED LOCALIZATION;  Surgeon: Dareen Ebbing, MD;  Location: [facility];  Service: General;  Laterality: Left;  LMA   ESOPHAGOGASTRODUODENOSCOPY (EGD) WITH PROPOFOL  N/A 06/23/2021   Procedure:  ESOPHAGOGASTRODUODENOSCOPY (EGD) WITH PROPOFOL ;  Surgeon: Vinetta Greening, DO;  Location: [facility];  Service: Endoscopy;  Laterality: N/A;   KNEE ARTHROSCOPY Left 12/24/2022   TUBAL LIGATION     WISDOM TOOTH EXTRACTION      Family History  Problem Relation Age of Onset   Breast cancer Paternal Grandmother    Diabetes Father    Cancer Father        not sure primary   Memory loss Mother    Cancer Brother        thyroid    COPD Brother    Breast cancer Daughter    Breast cancer Paternal Aunt    Lung cancer Paternal Aunt    Colon cancer Neg Hx    Esophageal cancer Neg Hx    Gastric cancer Neg Hx     Social History   Socioeconomic History   Marital status: Married    Spouse name: Actor   Number of children: 3   Years of education: Not on file   Highest education level: 9th grade  Occupational History   Occupation: homemaker  Tobacco Use   Smoking status: Never   Smokeless tobacco: Never  Vaping Use   Vaping status: Never Used  Substance and Sexual Activity   Alcohol use: No   Drug use: Never   Sexual activity: Not Currently    Birth control/protection: Surgical, Post-menopausal    Comment: tubal  Other Topics Concern   Not on file  Social History Narrative  Lives with [facility] years   1 son, 2 daughters   4 grandchildren       Cat: Lucy   Dog: Roofus       Enjoys: walking with dog, flowers, sewing, iron       Diet: eats all food groups-limited meat    Caffeine: some coke    Water: 2-4 cups of water      Wears seat belt   Smoke detectors at home    Social Drivers of Health   Financial Resource Strain: Low Risk  (08/30/2023)   Overall Financial Resource Strain (CARDIA)    Difficulty of Paying Living Expenses: Not hard at all  Food Insecurity: No Food Insecurity (08/30/2023)   Hunger Vital Sign    Worried About Running Out of Food in the Last Year: Never true    Ran Out of Food in the Last Year: Never true  Transportation Needs: No  Transportation Needs (08/30/2023)   PRAPARE - Administrator, Civil Service (Medical): No    Lack of Transportation (Non-Medical): No  Physical Activity: Insufficiently Active (08/30/2023)   Exercise Vital Sign    Days of Exercise per Week: 7 days    Minutes of Exercise per Session: 20 min  Stress: No Stress Concern Present (08/30/2023)   [employer] of Occupational Health - Occupational Stress Questionnaire    Feeling of Stress : Only a little  Social Connections: Moderately Isolated (08/30/2023)   Social Connection and Isolation Panel [NHANES]    Frequency of Communication with Friends and Family: More than three times a week    Frequency of Social Gatherings with Friends and Family: Once a week    Attends Religious Services: Never    Database administrator or Organizations: No    Attends Banker Meetings: Never    Marital Status: Married  Catering manager Violence: Not At Risk (08/30/2023)   Humiliation, Afraid, Rape, and Kick questionnaire    Fear of Current or Ex-Partner: No    Emotionally Abused: No    Physically Abused: No    Sexually Abused: No    Outpatient Medications Prior to Visit  Medication Sig Dispense Refill   amLODipine  (NORVASC ) 5 MG tablet Take 1 tablet by mouth daily. 90 tablet 0   atorvastatin  (LIPITOR) 40 MG tablet Take 1 tablet (40 mg total) by mouth daily. 90 tablet 1   celecoxib (CELEBREX) 200 MG capsule Take 200 mg by mouth 2 (two) times daily.     pantoprazole  (PROTONIX ) 40 MG tablet TAKE ONE TABLET BY MOUTH TWICE DAILY before a meal 180 tablet 0   sertraline  (ZOLOFT ) 25 MG tablet Take 1 tablet (25 mg total) by mouth daily. 30 tablet 3   VITAMIN D , ERGOCALCIFEROL , PO Take 125 mcg by mouth daily.     Aspirin-Acetaminophen -Caffeine (GOODYS EXTRA STRENGTH) 500-325-65 MG PACK Take 1 Package by mouth daily as needed (pain). (Patient not taking: Reported on 11/15/2023)     No facility-administered medications prior to visit.     Allergies  Allergen Reactions   Codeine Other (See Comments)    Chest pain   Lisinopril  Cough    Review of Systems  Constitutional:  Positive for chills. Negative for fatigue and fever.  HENT:  Positive for congestion and sore throat. Negative for ear pain and sinus pain.   Respiratory:  Positive for cough. Negative for shortness of breath.   Cardiovascular:  Negative for chest pain.  Gastrointestinal:  Negative for abdominal pain,  constipation, diarrhea, nausea and vomiting.  Musculoskeletal:  Negative for myalgias.  Neurological:  Negative for headaches.       Objective:        11/15/2023   10:37 AM 10/18/2023    1:59 PM 08/30/2023    8:31 AM  Vitals with BMI  Height 5' 5.5" 5' 5.5" 5' 5.5"  Weight 154 lbs 13 oz 158 lbs 157 lbs  BMI 25.36 25.88 25.72  Systolic 106 94 110  Diastolic 86 60 72  Pulse 98 76 77    No data found.   Physical Exam Vitals and nursing note reviewed.  HENT:     Head: Normocephalic and atraumatic.     Mouth/Throat:     Comments: HEENT: Pupils equal and reactive to light. Right nostril with discharge. Throat without gross redness. NECK: No major cervical lymphadenopathy. Cardiovascular:     Rate and Rhythm: Normal rate and regular rhythm.  Pulmonary:     Effort: Pulmonary effort is normal.     Breath sounds: Normal breath sounds.     Health Maintenance Due  Topic Date Due   Zoster Vaccines- Shingrix (1 of 2) Never done    There are no preventive care reminders to display for this patient.   Lab Results  Component Value Date   TSH 0.765 11/15/2022   Lab Results  Component Value Date   WBC 5.0 10/18/2023   HGB 12.5 10/18/2023   HCT 39.3 10/18/2023   MCV 88 10/18/2023   PLT 179 10/18/2023   Lab Results  Component Value Date   NA 143 10/18/2023   K 4.5 10/18/2023   CO2 25 10/18/2023   GLUCOSE 84 10/18/2023   BUN 17 10/18/2023   CREATININE 1.18 (H) 10/18/2023   BILITOT 0.2 10/18/2023   ALKPHOS 101 10/18/2023   AST  16 10/18/2023   ALT 11 10/18/2023   PROT 6.7 10/18/2023   ALBUMIN 4.5 10/18/2023   CALCIUM  9.4 10/18/2023   ANIONGAP 13 12/13/2018   EGFR 52 (L) 10/18/2023   Lab Results  Component Value Date   CHOL 143 10/18/2023   Lab Results  Component Value Date   HDL 65 10/18/2023   Lab Results  Component Value Date   LDLCALC 52 10/18/2023   Lab Results  Component Value Date   TRIG 153 (H) 10/18/2023   Lab Results  Component Value Date   CHOLHDL 2.2 10/18/2023   Lab Results  Component Value Date   HGBA1C 5.4 11/25/2021       Assessment & Plan:  Flu-like symptoms Assessment & Plan: Negative Flu and covid test today Positive for Strep  Treated with antibiotic Amoxicillin  Orders: -     POCT rapid strep A -     POC COVID-19 BinaxNow -     POCT Influenza A/B  Sore throat Assessment & Plan: Acute streptococcal pharyngitis confirmed by positive strep test. Symptoms include dry throat, cough, and body aches since Saturday. No fever reported. Examination reveals no significant throat redness or lymphadenopathy. Viral infection ruled out due to positive strep test. Scheduled for right knee replacement surgery, necessitating prompt treatment to avoid complications. Amoxicillin chosen due to allergies to codeine and lisinopril . - Prescribe amoxicillin 500 mg three times daily for 10 days (30 tablets). - Advise to take amoxicillin until the night before surgery and resume after surgery. - Instruct to maintain hydration and perform saline gargles.   Other orders -     Amoxicillin; Take 1 capsule (500 mg total) by mouth 3 (  three) times daily for 10 days.  Dispense: 30 capsule; Refill: 0    Assessment and Plan           Meds ordered this encounter  Medications   amoxicillin (AMOXIL) 500 MG capsule    Sig: Take 1 capsule (500 mg total) by mouth 3 (three) times daily for 10 days.    Dispense:  30 capsule    Refill:  0    Orders Placed This Encounter  Procedures   POCT  rapid strep A   POC COVID-19 BinaxNow   POCT Influenza A/B     Follow-up: Return if symptoms worsen or fail to improve.  An After Visit Summary was printed and given to the patient.  Shameika Speelman, MD [facility] [phone]

## 2023-11-15 NOTE — Patient Instructions (Signed)
  VISIT SUMMARY: During your visit, we discussed your recent symptoms of a sore throat, cough, and fatigue, which began a few days ago. After conducting a strep test, it was confirmed that you have streptococcal pharyngitis. We also reviewed your allergies to codeine and lisinopril  to ensure safe treatment options.  YOUR PLAN: -STREPTOCOCCAL PHARYNGITIS: Streptococcal pharyngitis is a bacterial infection that causes a sore throat and other symptoms like cough and body aches. You have been prescribed amoxicillin 500 mg to be taken three times daily for 10 days. Please take the medication until the night before your surgery and resume it after the surgery. Additionally, stay hydrated and perform saline gargles to help soothe your throat.  -ALLERGY TO CODEINE AND LISINOPRIL : You have confirmed allergies to codeine and lisinopril . Amoxicillin is safe for you to use and has been prescribed for your current condition.  INSTRUCTIONS: Please follow the prescribed medication regimen and take amoxicillin 500 mg three times daily for 10 days. Take the medication until the night before your surgery and resume it after the surgery. Stay hydrated and perform saline gargles to help soothe your throat. If you experience any adverse reactions or if your symptoms worsen, please contact our office immediately.                      Contains text generated by Abridge.                                 Contains text generated by Abridge.

## 2023-11-15 NOTE — Assessment & Plan Note (Signed)
 Negative Flu and covid test today Positive for Strep  Treated with antibiotic Amoxicillin

## 2023-11-19 ENCOUNTER — Other Ambulatory Visit: Payer: Self-pay | Admitting: Family Medicine

## 2023-12-05 ENCOUNTER — Other Ambulatory Visit: Payer: Self-pay

## 2023-12-05 ENCOUNTER — Other Ambulatory Visit: Payer: Self-pay | Admitting: Family Medicine

## 2023-12-05 DIAGNOSIS — K219 Gastro-esophageal reflux disease without esophagitis: Secondary | ICD-10-CM

## 2023-12-08 ENCOUNTER — Telehealth: Payer: Self-pay

## 2023-12-08 DIAGNOSIS — M1711 Unilateral primary osteoarthritis, right knee: Secondary | ICD-10-CM | POA: Diagnosis not present

## 2023-12-08 DIAGNOSIS — I10 Essential (primary) hypertension: Secondary | ICD-10-CM | POA: Diagnosis not present

## 2023-12-08 DIAGNOSIS — G8918 Other acute postprocedural pain: Secondary | ICD-10-CM | POA: Diagnosis not present

## 2023-12-08 HISTORY — PX: TOTAL KNEE ARTHROPLASTY: SHX125

## 2023-12-08 NOTE — Telephone Encounter (Signed)
 Spoke with ANN and made her aware that Dr. Reinhold Carbine is the patient primary provider orders can be sent to our office  Copied from CRM [id number]. Topic: Clinical - Medication Question >> Dec 08, 2023 12:28 PM Georgeann Kindred wrote: Reason for CRM: Ann with [facility] calling to verify if patient's PCP [facility] will sign medical orders for the patient if they open home health services for the patient. Please contact Ann at [phone] or anyone at facility is able to assist.

## 2023-12-10 DIAGNOSIS — G8929 Other chronic pain: Secondary | ICD-10-CM | POA: Diagnosis not present

## 2023-12-10 DIAGNOSIS — I10 Essential (primary) hypertension: Secondary | ICD-10-CM | POA: Diagnosis not present

## 2023-12-10 DIAGNOSIS — J020 Streptococcal pharyngitis: Secondary | ICD-10-CM | POA: Diagnosis not present

## 2023-12-10 DIAGNOSIS — Z791 Long term (current) use of non-steroidal anti-inflammatories (NSAID): Secondary | ICD-10-CM | POA: Diagnosis not present

## 2023-12-10 DIAGNOSIS — Z96653 Presence of artificial knee joint, bilateral: Secondary | ICD-10-CM | POA: Diagnosis not present

## 2023-12-10 DIAGNOSIS — Z471 Aftercare following joint replacement surgery: Secondary | ICD-10-CM | POA: Diagnosis not present

## 2023-12-10 DIAGNOSIS — K219 Gastro-esophageal reflux disease without esophagitis: Secondary | ICD-10-CM | POA: Diagnosis not present

## 2023-12-10 DIAGNOSIS — E7800 Pure hypercholesterolemia, unspecified: Secondary | ICD-10-CM | POA: Diagnosis not present

## 2023-12-10 DIAGNOSIS — Z7982 Long term (current) use of aspirin: Secondary | ICD-10-CM | POA: Diagnosis not present

## 2023-12-13 DIAGNOSIS — Z471 Aftercare following joint replacement surgery: Secondary | ICD-10-CM | POA: Diagnosis not present

## 2023-12-13 DIAGNOSIS — I10 Essential (primary) hypertension: Secondary | ICD-10-CM | POA: Diagnosis not present

## 2023-12-13 DIAGNOSIS — Z7982 Long term (current) use of aspirin: Secondary | ICD-10-CM | POA: Diagnosis not present

## 2023-12-13 DIAGNOSIS — Z96653 Presence of artificial knee joint, bilateral: Secondary | ICD-10-CM | POA: Diagnosis not present

## 2023-12-13 DIAGNOSIS — J020 Streptococcal pharyngitis: Secondary | ICD-10-CM | POA: Diagnosis not present

## 2023-12-13 DIAGNOSIS — Z96651 Presence of right artificial knee joint: Secondary | ICD-10-CM | POA: Diagnosis not present

## 2023-12-13 DIAGNOSIS — Z791 Long term (current) use of non-steroidal anti-inflammatories (NSAID): Secondary | ICD-10-CM | POA: Diagnosis not present

## 2023-12-13 DIAGNOSIS — K219 Gastro-esophageal reflux disease without esophagitis: Secondary | ICD-10-CM | POA: Diagnosis not present

## 2023-12-13 DIAGNOSIS — E7800 Pure hypercholesterolemia, unspecified: Secondary | ICD-10-CM | POA: Diagnosis not present

## 2023-12-13 DIAGNOSIS — G8929 Other chronic pain: Secondary | ICD-10-CM | POA: Diagnosis not present

## 2023-12-16 DIAGNOSIS — G8929 Other chronic pain: Secondary | ICD-10-CM | POA: Diagnosis not present

## 2023-12-16 DIAGNOSIS — J020 Streptococcal pharyngitis: Secondary | ICD-10-CM | POA: Diagnosis not present

## 2023-12-16 DIAGNOSIS — K219 Gastro-esophageal reflux disease without esophagitis: Secondary | ICD-10-CM | POA: Diagnosis not present

## 2023-12-16 DIAGNOSIS — Z96653 Presence of artificial knee joint, bilateral: Secondary | ICD-10-CM | POA: Diagnosis not present

## 2023-12-16 DIAGNOSIS — E7800 Pure hypercholesterolemia, unspecified: Secondary | ICD-10-CM | POA: Diagnosis not present

## 2023-12-16 DIAGNOSIS — Z471 Aftercare following joint replacement surgery: Secondary | ICD-10-CM | POA: Diagnosis not present

## 2023-12-16 DIAGNOSIS — I10 Essential (primary) hypertension: Secondary | ICD-10-CM | POA: Diagnosis not present

## 2023-12-16 DIAGNOSIS — Z791 Long term (current) use of non-steroidal anti-inflammatories (NSAID): Secondary | ICD-10-CM | POA: Diagnosis not present

## 2023-12-16 DIAGNOSIS — Z7982 Long term (current) use of aspirin: Secondary | ICD-10-CM | POA: Diagnosis not present

## 2023-12-19 DIAGNOSIS — E7800 Pure hypercholesterolemia, unspecified: Secondary | ICD-10-CM | POA: Diagnosis not present

## 2023-12-19 DIAGNOSIS — I10 Essential (primary) hypertension: Secondary | ICD-10-CM | POA: Diagnosis not present

## 2023-12-19 DIAGNOSIS — Z96653 Presence of artificial knee joint, bilateral: Secondary | ICD-10-CM | POA: Diagnosis not present

## 2023-12-19 DIAGNOSIS — Z791 Long term (current) use of non-steroidal anti-inflammatories (NSAID): Secondary | ICD-10-CM | POA: Diagnosis not present

## 2023-12-19 DIAGNOSIS — K219 Gastro-esophageal reflux disease without esophagitis: Secondary | ICD-10-CM | POA: Diagnosis not present

## 2023-12-19 DIAGNOSIS — Z7982 Long term (current) use of aspirin: Secondary | ICD-10-CM | POA: Diagnosis not present

## 2023-12-19 DIAGNOSIS — Z471 Aftercare following joint replacement surgery: Secondary | ICD-10-CM | POA: Diagnosis not present

## 2023-12-19 DIAGNOSIS — J020 Streptococcal pharyngitis: Secondary | ICD-10-CM | POA: Diagnosis not present

## 2023-12-19 DIAGNOSIS — G8929 Other chronic pain: Secondary | ICD-10-CM | POA: Diagnosis not present

## 2023-12-22 DIAGNOSIS — M25561 Pain in right knee: Secondary | ICD-10-CM | POA: Diagnosis not present

## 2023-12-22 DIAGNOSIS — M25661 Stiffness of right knee, not elsewhere classified: Secondary | ICD-10-CM | POA: Diagnosis not present

## 2023-12-22 DIAGNOSIS — M25461 Effusion, right knee: Secondary | ICD-10-CM | POA: Diagnosis not present

## 2023-12-22 DIAGNOSIS — R2689 Other abnormalities of gait and mobility: Secondary | ICD-10-CM | POA: Diagnosis not present

## 2023-12-22 DIAGNOSIS — M6281 Muscle weakness (generalized): Secondary | ICD-10-CM | POA: Diagnosis not present

## 2023-12-29 DIAGNOSIS — M25561 Pain in right knee: Secondary | ICD-10-CM | POA: Diagnosis not present

## 2023-12-29 DIAGNOSIS — M6281 Muscle weakness (generalized): Secondary | ICD-10-CM | POA: Diagnosis not present

## 2023-12-29 DIAGNOSIS — M25661 Stiffness of right knee, not elsewhere classified: Secondary | ICD-10-CM | POA: Diagnosis not present

## 2023-12-29 DIAGNOSIS — M25461 Effusion, right knee: Secondary | ICD-10-CM | POA: Diagnosis not present

## 2023-12-29 DIAGNOSIS — R2689 Other abnormalities of gait and mobility: Secondary | ICD-10-CM | POA: Diagnosis not present

## 2024-01-03 ENCOUNTER — Ambulatory Visit

## 2024-01-03 VITALS — BP 92/62 | HR 97 | Temp 97.6°F | Ht 65.5 in | Wt 155.0 lb

## 2024-01-03 DIAGNOSIS — R079 Chest pain, unspecified: Secondary | ICD-10-CM | POA: Diagnosis not present

## 2024-01-03 DIAGNOSIS — Z9189 Other specified personal risk factors, not elsewhere classified: Secondary | ICD-10-CM

## 2024-01-03 DIAGNOSIS — N1831 Chronic kidney disease, stage 3a: Secondary | ICD-10-CM | POA: Diagnosis not present

## 2024-01-03 DIAGNOSIS — M1711 Unilateral primary osteoarthritis, right knee: Secondary | ICD-10-CM

## 2024-01-03 DIAGNOSIS — E782 Mixed hyperlipidemia: Secondary | ICD-10-CM

## 2024-01-03 DIAGNOSIS — I10 Essential (primary) hypertension: Secondary | ICD-10-CM | POA: Diagnosis not present

## 2024-01-03 DIAGNOSIS — G8929 Other chronic pain: Secondary | ICD-10-CM | POA: Insufficient documentation

## 2024-01-03 DIAGNOSIS — F419 Anxiety disorder, unspecified: Secondary | ICD-10-CM | POA: Diagnosis not present

## 2024-01-03 HISTORY — DX: Other chronic pain: G89.29

## 2024-01-03 NOTE — Assessment & Plan Note (Signed)
 Postoperative care following right knee replacement Underwent right knee replacement on Dec 08, 2023, with discharge after one night. Recovery is progressing well with weekly physical therapy and compliance with home exercises despite some pain. No surgical complications reported. Spinal anesthesia facilitated easier recovery. - Continue weekly physical therapy - Continue home exercises for knee rehabilitation - Continue Celebrex for pain management - Adjust oxycodone  use based on pain levels

## 2024-01-03 NOTE — Assessment & Plan Note (Signed)
 Feels overwhelmed and anxious, contributing to chest soreness and palpitations.   Currently on low-dose Zoloft ; higher doses not tolerated. Misses regular therapy sessions, impacting anxiety management. Stress from personal life events may exacerbate symptoms. - Encourage resuming therapy sessions, possibly via telehealth - Continue Zoloft  at current dose of 25 mg - Practice relaxation techniques such as deep breathing

## 2024-01-03 NOTE — Assessment & Plan Note (Signed)
 Intermittent chest soreness and palpitations. Not exertional. Not associated with SOB. Previous echocardiogram in July 2019 showed mild to moderate mitral regurgitation but no significant murmur noted today.  Current symptoms may be anxiety-related. Blood pressure is low at 92/62 mmHg without dizziness. She feels overwhelmed and anxious, potentially contributing to symptoms. Heart sounds normal, no significant murmur. Referral to cardiology for further evaluation. - Refer to cardiology for evaluation and possible stress test as per patient request as she states she will feel reassured with this referral.  - Monitor symptoms and correlate with activity or anxiety - Continue aspirin 81 mg, atorvastatin  40 mg, and amlodipine  5 mg - Consider reducing amlodipine  if blood pressure remains low - Instruct to call 911 if severe chest pain occurs

## 2024-01-03 NOTE — Assessment & Plan Note (Signed)
 On Lipitor 40 mg daily  Normal lipid panel last blood work,

## 2024-01-03 NOTE — Assessment & Plan Note (Signed)
 BP well controlled with Amlodipine  5 mg daily

## 2024-01-03 NOTE — Patient Instructions (Signed)
  VISIT SUMMARY: You visited us  today due to chest soreness and palpitations, and we also discussed your recovery from your recent right knee replacement. We addressed your anxiety and its potential impact on your symptoms.  YOUR PLAN: POSTOPERATIVE CARE FOLLOWING RIGHT KNEE REPLACEMENT: You had a right knee replacement on Dec 08, 2023, and your recovery is progressing well with physical therapy and home exercises. -Continue weekly physical therapy. -Continue home exercises for knee rehabilitation. -Continue Celebrex for pain management. -Adjust oxycodone  use based on pain levels.  CHEST PAIN AND PALPITATIONS: You have been experiencing intermittent chest soreness and palpitations, which may be related to anxiety. Your blood pressure is low but not causing dizziness. -Refer to cardiology for evaluation and possible stress test. -Monitor symptoms and correlate with activity or anxiety. -Continue taking aspirin 81 mg, atorvastatin  40 mg, and amlodipine  5 mg. -Consider reducing amlodipine  if blood pressure remains low. -Call 911 if you experience severe chest pain.  ANXIETY: You feel overwhelmed and anxious, which may be contributing to your chest soreness and palpitations. You are currently on a low dose of Zoloft . -Encourage resuming therapy sessions, possibly via telehealth. -Continue Zoloft  at the current dose. -Practice relaxation techniques such as deep breathing.  GENERAL HEALTH MAINTENANCE: You have made significant lifestyle changes, resulting in weight loss. Your recent blood work showed normal cholesterol and blood sugar levels, with slightly low kidney function but no major concerns. -Continue healthy diet and lifestyle changes. -Monitor kidney function as needed.                      Contains text generated by Abridge.                                 Contains text generated by Abridge.

## 2024-01-03 NOTE — Progress Notes (Signed)
 Subjective:  Patient ID: Kathy Perry, female    DOB: [birth year]  Age: 63 y.o. MRN: [id number]  Chief Complaint  Patient presents with   Medical Management of Chronic Issues    HPI: Discussed the use of AI scribe software for clinical note transcription with the patient, who gave verbal consent to proceed.  History of Present Illness   Kathy Perry is a 63 year old female who presents for follow up appointment. She is also concerned about intermittent chest soreness and palpitations. She is accompanied by her husband.   She underwent a right knee replacement on Dec 08, 2023, and was discharged after one night in the hospital due to blood pressure and heart rate concerns. She attends physical therapy once a week and performs exercises at home. She previously had a left knee replacement in June 2024. Pain management includes oxycodone , which she takes occasionally, cutting the tablets in half, with the last dose taken at 3 AM due to knee pain.  She experiences chest soreness and a sensation of her heartbeat in her throat, described as 'fluttering'. These symptoms occur sporadically and are sometimes associated with anxiety. No severe chest pain is reported, but she feels overwhelmed due to personal stressors. She has a history of palpitations and underwent an echocardiogram in July 2019, along with a heart monitor at that time.  She has a history of low blood pressure and feels tired but not dizzy. Her current medications include amlodipine  5 mg for blood pressure, atorvastatin  40 mg, aspirin 81 mg, and occasionally oxycodone  for knee pain. She has a history of a breast cancer scare and feels paranoid about health issues. No significant weight loss is reported, although she initially lost weight after her hospital stay due to a lack of appetite. She has since regained weight, partly due to consuming chocolate and sugar. She quit fried foods and sweet drinks, resulting in a weight loss  of 54 pounds.  No current cough, significant swallowing difficulties, or vertigo. She reports occasional stuffiness in the fall but does not take medication for allergies. She has a history of vertigo, which occurred once during a hospital stay but resolved after a day.  She has a history of anxiety and is on a low dose of Zoloft . She feels overwhelmed by personal and family stressors, including her daughter's issues and her sister's health. She has not been able to see her therapist recently, which she feels contributes to her anxiety.         11/15/2023   10:42 AM 08/30/2023    8:35 AM 03/07/2023    1:41 PM 11/15/2022   11:13 AM 05/03/2022   11:02 AM  Depression screen PHQ 2/9  Decreased Interest 0 0 0 0 1  Down, Depressed, Hopeless 0 1 1 1 1   PHQ - 2 Score 0 1 1 1 2   Altered sleeping  2 1 0 1  Tired, decreased energy  2 3 3 3   Change in appetite  2 3 0 0  Feeling bad or failure about yourself   0 1 1 0  Trouble concentrating  0 0 0 0  Moving slowly or fidgety/restless  0 0 1 0  Suicidal thoughts  0 0 0 0  PHQ-9 Score  7 9 6 6   Difficult doing work/chores  Not difficult at all Not difficult at all Somewhat difficult Not difficult at all        11/15/2023   10:42 AM  Fall Risk  Falls in the past year? 0  Number falls in past yr: 0  Injury with Fall? 0  Risk for fall due to : No Fall Risks    Patient Care Team: Mercy Stall, MD as PCP - General (Family Medicine) Flavia Hughs, MD (Inactive) as PCP - Cardiology (Cardiology) Alyce Jubilee, MD (Inactive) as Consulting Physician (Gastroenterology) Vinetta Greening, DO as Consulting Physician (Internal Medicine)   Review of Systems  Constitutional:  Negative for chills, fatigue and fever.  HENT:  Negative for congestion, ear pain, sinus pressure and sore throat.   Respiratory:  Negative for cough and shortness of breath.   Cardiovascular:  Positive for palpitations. Negative for chest pain (soreness).   Gastrointestinal:  Negative for abdominal pain, constipation, diarrhea, nausea and vomiting.  Genitourinary:  Negative for dysuria and frequency.  Musculoskeletal:  Positive for arthralgias (right knee pain) and back pain. Negative for myalgias.  Neurological:  Negative for dizziness and headaches.  Psychiatric/Behavioral:  Negative for dysphoric mood. The patient is nervous/anxious.     Current Outpatient Medications on File Prior to Visit  Medication Sig Dispense Refill   amLODipine  (NORVASC ) 5 MG tablet Take 1 tablet by mouth daily. 90 tablet 0   ASPIRIN LOW DOSE 81 MG tablet Take 81 mg by mouth 2 (two) times daily.     atorvastatin  (LIPITOR) 40 MG tablet Take 1 tablet (40 mg total) by mouth daily. 90 tablet 1   celecoxib (CELEBREX) 200 MG capsule Take 200 mg by mouth 2 (two) times daily.     GOODSENSE PAIN RELIEF 325 MG tablet Take 975 mg by mouth 3 (three) times daily as needed.     oxyCODONE  (OXY IR/ROXICODONE ) 5 MG immediate release tablet Take 5 mg by mouth every 4 (four) hours as needed.     pantoprazole  (PROTONIX ) 40 MG tablet TAKE ONE TABLET BY MOUTH TWICE DAILY before a meal 180 tablet 0   promethazine  (PHENERGAN ) 25 MG tablet Take 25 mg by mouth every 6 (six) hours as needed.     sertraline  (ZOLOFT ) 25 MG tablet Take 1 tablet (25 mg total) by mouth daily. 30 tablet 3   VITAMIN D , ERGOCALCIFEROL , PO Take 125 mcg by mouth daily.     Aspirin-Acetaminophen -Caffeine (GOODYS EXTRA STRENGTH) 500-325-65 MG PACK Take 1 Package by mouth daily as needed (pain). (Patient not taking: Reported on 01/03/2024)     No current facility-administered medications on file prior to visit.   Past Medical History:  Diagnosis Date   Anxiety    Arthritis    GERD (gastroesophageal reflux disease)    IBS (irritable bowel syndrome)    Need for Tdap vaccination 11/13/2019   Past Surgical History:  Procedure Laterality Date   BALLOON DILATION N/A 06/23/2021   Procedure: BALLOON DILATION;  Surgeon:  Vinetta Greening, DO;  [facility];  Service: Endoscopy;  Laterality: N/A;   BIOPSY  06/23/2021   Procedure: BIOPSY;  Surgeon: Vinetta Greening, DO;  [facility];  Service: Endoscopy;;   BREAST EXCISIONAL BIOPSY     BREAST LUMPECTOMY WITH RADIOACTIVE SEED LOCALIZATION Left 02/28/2020   Procedure: LEFT BREAST LUMPECTOMY WITH RADIOACTIVE SEED LOCALIZATION;  Surgeon: Dareen Ebbing, MD;  Location: [facility];  Service: General;  Laterality: Left;  LMA   ESOPHAGOGASTRODUODENOSCOPY (EGD) WITH PROPOFOL  N/A 06/23/2021   Procedure: ESOPHAGOGASTRODUODENOSCOPY (EGD) WITH PROPOFOL ;  Surgeon: Vinetta Greening, DO;  [facility];  Service: Endoscopy;  Laterality: N/A;   KNEE ARTHROSCOPY Left  12/24/2022   REPLACEMENT TOTAL KNEE Right    TOTAL KNEE ARTHROPLASTY Right 12/08/2023   TUBAL LIGATION     WISDOM TOOTH EXTRACTION      Family History  Problem Relation Age of Onset   Breast cancer Paternal Grandmother    Diabetes Father    Cancer Father        not sure primary   Memory loss Mother    Cancer Brother        thyroid    COPD Brother    Breast cancer Daughter    Breast cancer Paternal Aunt    Lung cancer Paternal Aunt    Colon cancer Neg Hx    Esophageal cancer Neg Hx    Gastric cancer Neg Hx    Social History   Socioeconomic History   Marital status: Married    Spouse name: Actor   Number of children: 3   Years of education: Not on file   Highest education level: 9th grade  Occupational History   Occupation: homemaker  Tobacco Use   Smoking status: Never   Smokeless tobacco: Never  Vaping Use   Vaping status: Never Used  Substance and Sexual Activity   Alcohol use: No   Drug use: Never   Sexual activity: Not Currently    Birth control/protection: Surgical, Post-menopausal    Comment: tubal  Other Topics Concern   Not on file  Social History Narrative   Lives with Kathy Perry 41 years   1 son, 2 daughters   4  grandchildren       Cat: Lucy   Dog: Roofus       Enjoys: walking with dog, flowers, sewing, iron       Diet: eats all food groups-limited meat    Caffeine: some coke    Water: 2-4 cups of water      Wears seat belt   Smoke detectors at home    Social Drivers of Health   Financial Resource Strain: Low Risk  (08/30/2023)   Overall Financial Resource Strain (CARDIA)    Difficulty of Paying Living Expenses: Not hard at all  Food Insecurity: No Food Insecurity (08/30/2023)   Hunger Vital Sign    Worried About Running Out of Food in the Last Year: Never true    Ran Out of Food in the Last Year: Never true  Transportation Needs: No Transportation Needs (08/30/2023)   PRAPARE - Administrator, Civil Service (Medical): No    Lack of Transportation (Non-Medical): No  Physical Activity: Insufficiently Active (08/30/2023)   Exercise Vital Sign    Days of Exercise per Week: 7 days    Minutes of Exercise per Session: 20 min  Stress: No Stress Concern Present (08/30/2023)   [employer] of Occupational Health - Occupational Stress Questionnaire    Feeling of Stress : Only a little  Social Connections: Moderately Isolated (08/30/2023)   Social Connection and Isolation Panel    Frequency of Communication with Friends and Family: More than three times a week    Frequency of Social Gatherings with Friends and Family: Once a week    Attends Religious Services: Never    Database administrator or Organizations: No    Attends Engineer, structural: Never    Marital Status: Married    Objective:  BP 92/62   Pulse 97   Temp 97.6 F (36.4 C)   Ht 5' 5.5 (1.664 m)   Wt 155 lb (70.3 kg)  LMP  (LMP Unknown)   SpO2 99%   BMI 25.40 kg/m      01/03/2024    1:16 PM 11/15/2023   10:37 AM 10/18/2023    1:59 PM  BP/Weight  Systolic BP 92 106 94  Diastolic BP 62 86 60  Wt. (Lbs) 155 154.8 158  BMI 25.4 kg/m2 25.37 kg/m2 25.89 kg/m2    Physical Exam Vitals and nursing  note reviewed.  Constitutional:      Appearance: Normal appearance.  HENT:     Head: Normocephalic and atraumatic.     Mouth/Throat:     Mouth: Mucous membranes are moist.   Eyes:     Pupils: Pupils are equal, round, and reactive to light.    Cardiovascular:     Rate and Rhythm: Normal rate and regular rhythm.     Comments: No chest tenderness on palpation Pulmonary:     Effort: Pulmonary effort is normal.     Breath sounds: Normal breath sounds.   Musculoskeletal:        General: Normal range of motion.     Cervical back: Normal range of motion.     Comments: Right knee TKR scar healing well Decent ROM   Neurological:     General: No focal deficit present.     Mental Status: She is alert.   Psychiatric:        Mood and Affect: Mood normal.         Lab Results  Component Value Date   WBC 5.0 10/18/2023   HGB 12.5 10/18/2023   HCT 39.3 10/18/2023   PLT 179 10/18/2023   GLUCOSE 84 10/18/2023   CHOL 143 10/18/2023   TRIG 153 (H) 10/18/2023   HDL 65 10/18/2023   LDLCALC 52 10/18/2023   ALT 11 10/18/2023   AST 16 10/18/2023   NA 143 10/18/2023   K 4.5 10/18/2023   CL 105 10/18/2023   CREATININE 1.18 (H) 10/18/2023   BUN 17 10/18/2023   CO2 25 10/18/2023   TSH 0.765 11/15/2022   INR 0.9 10/18/2023   HGBA1C 5.4 11/25/2021      Assessment & Plan:  Primary hypertension Assessment & Plan: BP well controlled with Amlodipine  5 mg daily   Orders: -     Ambulatory referral to Cardiology  Mixed hyperlipidemia Assessment & Plan: On Lipitor 40 mg daily  Normal lipid panel last blood work,  Orders: -     Ambulatory referral to Cardiology  Stage 3a chronic kidney disease (HCC) -     Ambulatory referral to Cardiology  Chronic chest pain with low to moderate risk for CAD Assessment & Plan: Intermittent chest soreness and palpitations. Not exertional. Not associated with SOB. Previous echocardiogram in July 2019 showed mild to moderate mitral  regurgitation but no significant murmur noted today.  Current symptoms may be anxiety-related. Blood pressure is low at 92/62 mmHg without dizziness. She feels overwhelmed and anxious, potentially contributing to symptoms. Heart sounds normal, no significant murmur. Referral to cardiology for further evaluation. - Refer to cardiology for evaluation and possible stress test as per patient request as she states she will feel reassured with this referral.  - Monitor symptoms and correlate with activity or anxiety - Continue aspirin 81 mg, atorvastatin  40 mg, and amlodipine  5 mg - Consider reducing amlodipine  if blood pressure remains low - Instruct to call 911 if severe chest pain occurs  Orders: -     Ambulatory referral to Cardiology  Primary osteoarthritis of right knee Assessment &  Plan: Postoperative care following right knee replacement Underwent right knee replacement on Dec 08, 2023, with discharge after one night. Recovery is progressing well with weekly physical therapy and compliance with home exercises despite some pain. No surgical complications reported. Spinal anesthesia facilitated easier recovery. - Continue weekly physical therapy - Continue home exercises for knee rehabilitation - Continue Celebrex for pain management - Adjust oxycodone  use based on pain levels   Anxiety Assessment & Plan: Feels overwhelmed and anxious, contributing to chest soreness and palpitations.   Currently on low-dose Zoloft ; higher doses not tolerated. Misses regular therapy sessions, impacting anxiety management. Stress from personal life events may exacerbate symptoms. - Encourage resuming therapy sessions, possibly via telehealth - Continue Zoloft  at current dose of 25 mg - Practice relaxation techniques such as deep breathing    Assessment and Plan            No orders of the defined types were placed in this encounter.   Orders Placed This Encounter  Procedures   Ambulatory  referral to Cardiology     Follow-up: Return in about 3 months (around 04/04/2024) for chronic disease follow up.     An After Visit Summary was printed and given to the patient.  Jackelyne Sayer, MD [facility] [phone]

## 2024-01-06 DIAGNOSIS — M6281 Muscle weakness (generalized): Secondary | ICD-10-CM | POA: Diagnosis not present

## 2024-01-06 DIAGNOSIS — M25461 Effusion, right knee: Secondary | ICD-10-CM | POA: Diagnosis not present

## 2024-01-06 DIAGNOSIS — M25661 Stiffness of right knee, not elsewhere classified: Secondary | ICD-10-CM | POA: Diagnosis not present

## 2024-01-06 DIAGNOSIS — R2689 Other abnormalities of gait and mobility: Secondary | ICD-10-CM | POA: Diagnosis not present

## 2024-01-06 DIAGNOSIS — M25561 Pain in right knee: Secondary | ICD-10-CM | POA: Diagnosis not present

## 2024-01-13 DIAGNOSIS — R2689 Other abnormalities of gait and mobility: Secondary | ICD-10-CM | POA: Diagnosis not present

## 2024-01-13 DIAGNOSIS — M25661 Stiffness of right knee, not elsewhere classified: Secondary | ICD-10-CM | POA: Diagnosis not present

## 2024-01-13 DIAGNOSIS — M25561 Pain in right knee: Secondary | ICD-10-CM | POA: Diagnosis not present

## 2024-01-13 DIAGNOSIS — M25461 Effusion, right knee: Secondary | ICD-10-CM | POA: Diagnosis not present

## 2024-01-13 DIAGNOSIS — M6281 Muscle weakness (generalized): Secondary | ICD-10-CM | POA: Diagnosis not present

## 2024-01-18 ENCOUNTER — Ambulatory Visit

## 2024-01-20 ENCOUNTER — Other Ambulatory Visit: Payer: Self-pay | Admitting: Family Medicine

## 2024-01-20 DIAGNOSIS — F419 Anxiety disorder, unspecified: Secondary | ICD-10-CM

## 2024-01-24 DIAGNOSIS — M25661 Stiffness of right knee, not elsewhere classified: Secondary | ICD-10-CM | POA: Diagnosis not present

## 2024-01-24 DIAGNOSIS — M25461 Effusion, right knee: Secondary | ICD-10-CM | POA: Diagnosis not present

## 2024-01-24 DIAGNOSIS — M6281 Muscle weakness (generalized): Secondary | ICD-10-CM | POA: Diagnosis not present

## 2024-01-24 DIAGNOSIS — R2689 Other abnormalities of gait and mobility: Secondary | ICD-10-CM | POA: Diagnosis not present

## 2024-01-24 DIAGNOSIS — M25561 Pain in right knee: Secondary | ICD-10-CM | POA: Diagnosis not present

## 2024-01-26 DIAGNOSIS — M6281 Muscle weakness (generalized): Secondary | ICD-10-CM | POA: Diagnosis not present

## 2024-01-26 DIAGNOSIS — M25461 Effusion, right knee: Secondary | ICD-10-CM | POA: Diagnosis not present

## 2024-01-26 DIAGNOSIS — M25561 Pain in right knee: Secondary | ICD-10-CM | POA: Diagnosis not present

## 2024-01-26 DIAGNOSIS — R2689 Other abnormalities of gait and mobility: Secondary | ICD-10-CM | POA: Diagnosis not present

## 2024-01-26 DIAGNOSIS — M25661 Stiffness of right knee, not elsewhere classified: Secondary | ICD-10-CM | POA: Diagnosis not present

## 2024-01-31 DIAGNOSIS — R2689 Other abnormalities of gait and mobility: Secondary | ICD-10-CM | POA: Diagnosis not present

## 2024-01-31 DIAGNOSIS — M25461 Effusion, right knee: Secondary | ICD-10-CM | POA: Diagnosis not present

## 2024-01-31 DIAGNOSIS — M25561 Pain in right knee: Secondary | ICD-10-CM | POA: Diagnosis not present

## 2024-01-31 DIAGNOSIS — M6281 Muscle weakness (generalized): Secondary | ICD-10-CM | POA: Diagnosis not present

## 2024-01-31 DIAGNOSIS — M25661 Stiffness of right knee, not elsewhere classified: Secondary | ICD-10-CM | POA: Diagnosis not present

## 2024-02-01 DIAGNOSIS — M1711 Unilateral primary osteoarthritis, right knee: Secondary | ICD-10-CM | POA: Diagnosis not present

## 2024-02-02 ENCOUNTER — Ambulatory Visit

## 2024-02-02 ENCOUNTER — Other Ambulatory Visit: Payer: Self-pay

## 2024-02-02 ENCOUNTER — Telehealth: Payer: Self-pay

## 2024-02-02 VITALS — BP 110/70 | HR 77 | Ht 65.5 in | Wt 155.0 lb

## 2024-02-02 DIAGNOSIS — I340 Nonrheumatic mitral (valve) insufficiency: Secondary | ICD-10-CM | POA: Insufficient documentation

## 2024-02-02 DIAGNOSIS — Z1231 Encounter for screening mammogram for malignant neoplasm of breast: Secondary | ICD-10-CM

## 2024-02-02 DIAGNOSIS — E782 Mixed hyperlipidemia: Secondary | ICD-10-CM | POA: Insufficient documentation

## 2024-02-02 DIAGNOSIS — I10 Essential (primary) hypertension: Secondary | ICD-10-CM | POA: Diagnosis not present

## 2024-02-02 DIAGNOSIS — R002 Palpitations: Secondary | ICD-10-CM

## 2024-02-02 HISTORY — DX: Nonrheumatic mitral (valve) insufficiency: I34.0

## 2024-02-02 HISTORY — DX: Palpitations: R00.2

## 2024-02-02 MED ORDER — AMLODIPINE BESYLATE 2.5 MG PO TABS: 2.5000 mg | ORAL_TABLET | Freq: Every day | ORAL | 3 refills | Status: AC

## 2024-02-02 NOTE — Assessment & Plan Note (Signed)
 Mild to moderate mitral regurgitation observed on last echocardiogram from July 2019.  Follow-up with repeat echocardiogram for any interval change.

## 2024-02-02 NOTE — Assessment & Plan Note (Signed)
 Symptoms of palpitation short lasting flutter like sensation in the chest without any other dangerous symptoms.  Will obtain a Zio patch for 2 weeks.

## 2024-02-02 NOTE — Progress Notes (Signed)
 Cardiology Consultation:    Date:  02/02/2024   ID:  Kathy Perry, DOB Aug 30, 1960, MRN [id number]  PCP:  Kathy Clapper, MD  Cardiologist:  Alean SAUNDERS Debria Broecker, MD   Referring MD: Perry, Mamatha, MD   No chief complaint on file.    ASSESSMENT AND PLAN:   Ms. Kathy Perry 63 year old woman with history of hypertension, hyperlipidemia, mild to moderate mitral regurgitation on prior echocardiogram from July 2019, recently underwent right knee total arthroplasty 12/08/2023, here for further evaluation of symptoms of palpitations.   Problem List Items Addressed This Visit     Primary hypertension   Well-controlled blood pressures. Relatively soft after recent knee surgery requiring overnight stay in the hospital.  Will cut down on amlodipine  dose to 2.5 mg once daily given well-controlled blood pressures. Target below 130/80 mmHg.       Relevant Medications   amLODipine  (NORVASC ) 2.5 MG tablet   Other Relevant Orders   EKG 12-Lead (Completed)   ECHOCARDIOGRAM COMPLETE   Hyperlipidemia   Recent lipid panel from April 2025 reviewed shows well-controlled levels with total cholesterol 143, HDL 65, LDL 52 and triglycerides 153. Continue current dose of atorvastatin  40 mg once daily.       Relevant Medications   amLODipine  (NORVASC ) 2.5 MG tablet   Other Relevant Orders   ECHOCARDIOGRAM COMPLETE   Mitral regurgitation   Mild to moderate mitral regurgitation observed on last echocardiogram from July 2019.  Follow-up with repeat echocardiogram for any interval change.        Relevant Medications   amLODipine  (NORVASC ) 2.5 MG tablet   Other Relevant Orders   ECHOCARDIOGRAM COMPLETE   Palpitations - Primary   Symptoms of palpitation short lasting flutter like sensation in the chest without any other dangerous symptoms.  Will obtain a Zio patch for 2 weeks.      Relevant Orders   LONG TERM MONITOR XT (3-14 DAYS)   ECHOCARDIOGRAM COMPLETE   Return to clinic  tentatively in 6 months.   History of Present Illness:    Kathy Perry is a 63 y.o. female who is being seen today for the evaluation of chest soreness, hypertension and hyperlipidemia at the request of Perry, Mamatha, MD.   Has history of hypertension, hyperlipidemia, recent right knee replacement 12/08/2023 and was monitored overnight due to low blood pressures. Last echocardiogram from July 2019 reported normal biventricular function EF 60 to 65%, grade 1 diastolic dysfunction and mild to moderate MR.  Here for the visit today accompanied by her husband. Mentions after surgery recovery has been going well.  We have been under stress with her sister-in-law's death recently secondary to lymphoma.  Overall she feels okay but has symptoms where she observes fluttering in the chest that can last for few seconds, this occurs infrequently.  No sustained episodes. No lightheadedness, dizziness or syncopal episodes with this. This she feels well and has slight chest discomfort.  Otherwise she denies any other symptoms of chest pain, shortness of breath, orthopnea paroxysmal nocturnal dyspnea.  No falls. Blood pressures are relatively well-controlled.  EKG in the clinic today shows sinus rhythm heart rate 70/min, PR interval 142 ms, QRS duration 80 ms, QTc 432 ms no ischemic changes.   Past Medical History:  Diagnosis Date   Anxiety    Arthritis    GERD (gastroesophageal reflux disease)    IBS (irritable bowel syndrome)    Need for Tdap vaccination 11/13/2019    Past Surgical History:  Procedure Laterality Date  BALLOON DILATION N/A 06/23/2021   Procedure: BALLOON DILATION;  Surgeon: Kathy Carlin POUR, DO;  [facility];  Service: Endoscopy;  Laterality: N/A;   BIOPSY  06/23/2021   Procedure: BIOPSY;  Surgeon: Kathy Carlin POUR, DO;  [facility];  Service: Endoscopy;;   BREAST EXCISIONAL BIOPSY     BREAST LUMPECTOMY WITH RADIOACTIVE SEED LOCALIZATION  Left 02/28/2020   Procedure: LEFT BREAST LUMPECTOMY WITH RADIOACTIVE SEED LOCALIZATION;  Surgeon: Kathy Cough, MD;  Location: [facility];  Service: General;  Laterality: Left;  LMA   ESOPHAGOGASTRODUODENOSCOPY (EGD) WITH PROPOFOL  N/A 06/23/2021   Procedure: ESOPHAGOGASTRODUODENOSCOPY (EGD) WITH PROPOFOL ;  Surgeon: Kathy Carlin POUR, DO;  [facility];  Service: Endoscopy;  Laterality: N/A;   KNEE ARTHROSCOPY Left 12/24/2022   REPLACEMENT TOTAL KNEE Right    TOTAL KNEE ARTHROPLASTY Right 12/08/2023   TUBAL LIGATION     WISDOM TOOTH EXTRACTION      Current Medications: Current Meds  Medication Sig   amLODipine  (NORVASC ) 2.5 MG tablet Take 1 tablet (2.5 mg total) by mouth daily.   Aspirin-Acetaminophen -Caffeine (GOODYS EXTRA STRENGTH) 500-325-65 MG PACK Take 1 Package by mouth daily as needed (pain).   atorvastatin  (LIPITOR) 40 MG tablet Take 1 tablet (40 mg total) by mouth daily.   celecoxib (CELEBREX) 200 MG capsule Take 200 mg by mouth 2 (two) times daily.   GOODSENSE PAIN RELIEF 325 MG tablet Take 975 mg by mouth 3 (three) times daily as needed.   pantoprazole  (PROTONIX ) 40 MG tablet TAKE ONE TABLET BY MOUTH TWICE DAILY before a meal   sertraline  (ZOLOFT ) 25 MG tablet TAKE ONE TABLET BY MOUTH DAILY   VITAMIN D , ERGOCALCIFEROL , PO Take 125 mcg by mouth daily.   [DISCONTINUED] amLODipine  (NORVASC ) 5 MG tablet Take 1 tablet by mouth daily.     Allergies:   Codeine and Lisinopril    Social History   Socioeconomic History   Marital status: Married    Spouse name: Actor   Number of children: 3   Years of education: Not on file   Highest education level: 9th grade  Occupational History   Occupation: homemaker  Tobacco Use   Smoking status: Never   Smokeless tobacco: Never  Vaping Use   Vaping status: Never Used  Substance and Sexual Activity   Alcohol use: No   Drug use: Never   Sexual activity: Not Currently    Birth control/protection: Surgical,  Post-menopausal    Comment: tubal  Other Topics Concern   Not on file  Social History Narrative   Lives with [place] [facility] 41 years   1 son, 2 daughters   4 grandchildren       Cat: Lucy   Dog: Roofus       Enjoys: walking with dog, flowers, sewing, iron       Diet: eats all food groups-limited meat    Caffeine: some coke    Water: 2-4 cups of water      Wears seat belt   Smoke detectors at home    Social Drivers of Health   Financial Resource Strain: Low Risk  (08/30/2023)   Overall Financial Resource Strain (CARDIA)    Difficulty of Paying Living Expenses: Not hard at all  Food Insecurity: No Food Insecurity (08/30/2023)   Hunger Vital Sign    Worried About Running Out of Food in the Last Year: Never true    Ran Out of Food in the Last Year: Never true  Transportation Needs:  No Transportation Needs (08/30/2023)   PRAPARE - Administrator, Civil Service (Medical): No    Lack of Transportation (Non-Medical): No  Physical Activity: Insufficiently Active (08/30/2023)   Exercise Vital Sign    Days of Exercise per Week: 7 days    Minutes of Exercise per Session: 20 min  Stress: No Stress Concern Present (08/30/2023)   [employer] of Occupational Health - Occupational Stress Questionnaire    Feeling of Stress : Only a little  Social Connections: Moderately Isolated (08/30/2023)   Social Connection and Isolation Panel    Frequency of Communication with Friends and Family: More than three times a week    Frequency of Social Gatherings with Friends and Family: Once a week    Attends Religious Services: Never    Database administrator or Organizations: No    Attends Engineer, structural: Never    Marital Status: Married     Family History: The patient's family history includes Breast cancer in her daughter, paternal aunt, and paternal grandmother; COPD in her brother; Cancer in her brother and father; Diabetes in her father; Lung cancer in her paternal  aunt; Memory loss in her mother. There is no history of Colon cancer, Esophageal cancer, or Gastric cancer. ROS:   Please see the history of present illness.    All 14 point review of systems negative except as described per history of present illness.  EKGs/Labs/Other Studies Reviewed:    The following studies were reviewed today:   EKG:  EKG Interpretation Date/Time:  Thursday February 02 2024 14:39:07 EDT Ventricular Rate:  70 PR Interval:  142 QRS Duration:  80 QT Interval:  400 QTC Calculation: 432 R Axis:   54  Text Interpretation: Normal sinus rhythm Normal ECG When compared with ECG of 13-Dec-2018 10:28, PREVIOUS ECG IS PRESENT Confirmed by Liborio Hai reddy [phone]) on 02/02/2024 3:03:20 PM    Recent Labs: 10/18/2023: ALT 11; BUN 17; Creatinine, Ser 1.18; Hemoglobin 12.5; Platelets 179; Potassium 4.5; Sodium 143  Recent Lipid Panel    Component Value Date/Time   CHOL 143 10/18/2023 1447   TRIG 153 (H) 10/18/2023 1447   HDL 65 10/18/2023 1447   CHOLHDL 2.2 10/18/2023 1447   LDLCALC 52 10/18/2023 1447    Physical Exam:    VS:  BP 110/70   Pulse 77   Ht 5' 5.5 (1.664 m)   Wt 155 lb (70.3 kg)   LMP  (LMP Unknown)   SpO2 99%   BMI 25.40 kg/m     Wt Readings from Last 3 Encounters:  02/02/24 155 lb (70.3 kg)  01/03/24 155 lb (70.3 kg)  11/15/23 154 lb 12.8 oz (70.2 kg)     GENERAL:  Well nourished, well developed in no acute distress NECK: No JVD; No carotid bruits CARDIAC: RRR, S1 and S2 present, no murmurs, no rubs, no gallops CHEST:  Clear to auscultation without rales, wheezing or rhonchi  Extremities: No pitting pedal edema. Pulses bilaterally symmetric with radial 2+ and dorsalis pedis 2+ NEUROLOGIC:  Alert and oriented x 3  Medication Adjustments/Labs and Tests Ordered: Current medicines are reviewed at length with the patient today.  Concerns regarding medicines are outlined above.  Orders Placed This Encounter  Procedures   LONG TERM MONITOR  XT (3-14 DAYS)   EKG 12-Lead   ECHOCARDIOGRAM COMPLETE   Meds ordered this encounter  Medications   amLODipine  (NORVASC ) 2.5 MG tablet    Sig: Take 1 tablet (2.5 mg total)  by mouth daily.    Dispense:  90 tablet    Refill:  3    Signed, Glorene Leitzke reddy Shamarie Call, MD, MPH, [facility]. 02/02/2024 3:03 PM    [facility]

## 2024-02-02 NOTE — Assessment & Plan Note (Signed)
 Well-controlled blood pressures. Relatively soft after recent knee surgery requiring overnight stay in the hospital.  Will cut down on amlodipine  dose to 2.5 mg once daily given well-controlled blood pressures. Target below 130/80 mmHg.

## 2024-02-02 NOTE — Telephone Encounter (Signed)
 Copied from CRM [id number]. Topic: Clinical - Request for Lab/Test Order >> Feb 02, 2024 11:38 AM Kathy Perry wrote: Reason for CRM: Pt called to request orders for her mammogram, she is due next month. Please advise

## 2024-02-02 NOTE — Patient Instructions (Signed)
 Medication Instructions:  Your physician has recommended you make the following change in your medication:   START: Amlodipine  2.5 mg daily  *If you need a refill on your cardiac medications before your next appointment, please call your pharmacy*  Lab Work: None If you have labs (blood work) drawn today and your tests are completely normal, you will receive your results only by: MyChart Message (if you have MyChart) OR A paper copy in the mail If you have any lab test that is abnormal or we need to change your treatment, we will call you to review the results.  Testing/Procedures: Your physician has requested that you have an echocardiogram. Echocardiography is a painless test that uses sound waves to create images of your heart. It provides your doctor with information about the size and shape of your heart and how well your heart's chambers and valves are working. This procedure takes approximately one hour. There are no restrictions for this procedure. Please do NOT wear cologne, perfume, aftershave, or lotions (deodorant is allowed). Please arrive 15 minutes prior to your appointment time.  Please note: We ask at that you not bring children with you during ultrasound (echo/ vascular) testing. Due to room size and safety concerns, children are not allowed in the ultrasound rooms during exams. Our front office staff cannot provide observation of children in our lobby area while testing is being conducted. An adult accompanying a patient to their appointment will only be allowed in the ultrasound room at the discretion of the ultrasound technician under special circumstances. We apologize for any inconvenience.  A zio monitor was ordered today. It will remain on for 14 days. You will then return monitor and event diary in provided box. It takes 1-2 weeks for report to be downloaded and returned to us . We will call you with the results. If monitor falls off or has orange flashing light, please  call Zio for further instructions.   Follow-Up: At [facility], you and your health needs are our priority.  As part of our continuing mission to provide you with exceptional heart care, our providers are all part of one team.  This team includes your primary Cardiologist (physician) and Advanced Practice Providers or APPs (Physician Assistants and Nurse Practitioners) who all work together to provide you with the care you need, when you need it.  Your next appointment:   6 month(s)  Provider:   Alean Kobus, MD    We recommend signing up for the patient portal called MyChart.  Sign up information is provided on this After Visit Summary.  MyChart is used to connect with patients for Virtual Visits (Telemedicine).  Patients are able to view lab/test results, encounter notes, upcoming appointments, etc.  Non-urgent messages can be sent to your provider as well.   To learn more about what you can do with MyChart, go to [email or web address].   Other Instructions None

## 2024-02-02 NOTE — Assessment & Plan Note (Signed)
 Recent lipid panel from April 2025 reviewed shows well-controlled levels with total cholesterol 143, HDL 65, LDL 52 and triglycerides 153. Continue current dose of atorvastatin  40 mg once daily.

## 2024-02-03 DIAGNOSIS — M6281 Muscle weakness (generalized): Secondary | ICD-10-CM | POA: Diagnosis not present

## 2024-02-03 DIAGNOSIS — M25561 Pain in right knee: Secondary | ICD-10-CM | POA: Diagnosis not present

## 2024-02-03 DIAGNOSIS — R2689 Other abnormalities of gait and mobility: Secondary | ICD-10-CM | POA: Diagnosis not present

## 2024-02-03 DIAGNOSIS — M25461 Effusion, right knee: Secondary | ICD-10-CM | POA: Diagnosis not present

## 2024-02-03 DIAGNOSIS — M25661 Stiffness of right knee, not elsewhere classified: Secondary | ICD-10-CM | POA: Diagnosis not present

## 2024-02-23 ENCOUNTER — Ambulatory Visit

## 2024-02-23 DIAGNOSIS — E782 Mixed hyperlipidemia: Secondary | ICD-10-CM | POA: Diagnosis not present

## 2024-02-23 DIAGNOSIS — R002 Palpitations: Secondary | ICD-10-CM | POA: Insufficient documentation

## 2024-02-23 DIAGNOSIS — I340 Nonrheumatic mitral (valve) insufficiency: Secondary | ICD-10-CM | POA: Diagnosis not present

## 2024-02-23 DIAGNOSIS — I10 Essential (primary) hypertension: Secondary | ICD-10-CM | POA: Diagnosis not present

## 2024-02-23 LAB — ECHOCARDIOGRAM COMPLETE
Area-P 1/2: 3.5 cm2
MV M vel: 5.37 m/s
MV Peak grad: 115.3 mmHg
P 1/2 time: 448 ms
Radius: 0.6 cm
S' Lateral: 3 cm

## 2024-02-24 ENCOUNTER — Ambulatory Visit: Payer: Self-pay

## 2024-02-29 DIAGNOSIS — M25561 Pain in right knee: Secondary | ICD-10-CM | POA: Diagnosis not present

## 2024-02-29 DIAGNOSIS — M25661 Stiffness of right knee, not elsewhere classified: Secondary | ICD-10-CM | POA: Diagnosis not present

## 2024-02-29 DIAGNOSIS — M25461 Effusion, right knee: Secondary | ICD-10-CM | POA: Diagnosis not present

## 2024-02-29 DIAGNOSIS — M6281 Muscle weakness (generalized): Secondary | ICD-10-CM | POA: Diagnosis not present

## 2024-02-29 DIAGNOSIS — R2689 Other abnormalities of gait and mobility: Secondary | ICD-10-CM | POA: Diagnosis not present

## 2024-03-01 ENCOUNTER — Ambulatory Visit (HOSPITAL_BASED_OUTPATIENT_CLINIC_OR_DEPARTMENT_OTHER)
Admission: RE | Admit: 2024-03-01 | Discharge: 2024-03-01 | Disposition: A | Discharge status: Home / Self Care | Source: Ambulatory Visit | Attending: Family Medicine | Admitting: Family Medicine

## 2024-03-01 DIAGNOSIS — Z1231 Encounter for screening mammogram for malignant neoplasm of breast: Secondary | ICD-10-CM | POA: Diagnosis not present

## 2024-03-03 ENCOUNTER — Ambulatory Visit: Payer: Self-pay | Admitting: Family Medicine

## 2024-03-15 ENCOUNTER — Other Ambulatory Visit: Payer: Self-pay

## 2024-03-15 DIAGNOSIS — K219 Gastro-esophageal reflux disease without esophagitis: Secondary | ICD-10-CM

## 2024-03-15 MED ORDER — PANTOPRAZOLE SODIUM 40 MG PO TBEC
DELAYED_RELEASE_TABLET | ORAL | 0 refills | Status: AC
Start: 2024-03-15 — End: ?

## 2024-03-20 DIAGNOSIS — M1711 Unilateral primary osteoarthritis, right knee: Secondary | ICD-10-CM | POA: Diagnosis not present

## 2024-04-05 ENCOUNTER — Ambulatory Visit

## 2024-04-05 VITALS — BP 110/82 | HR 76 | Temp 97.5°F | Ht 65.5 in | Wt 155.0 lb

## 2024-04-05 DIAGNOSIS — R5383 Other fatigue: Secondary | ICD-10-CM

## 2024-04-05 DIAGNOSIS — Z23 Encounter for immunization: Secondary | ICD-10-CM | POA: Diagnosis not present

## 2024-04-05 DIAGNOSIS — F331 Major depressive disorder, recurrent, moderate: Secondary | ICD-10-CM

## 2024-04-05 DIAGNOSIS — N1831 Chronic kidney disease, stage 3a: Secondary | ICD-10-CM

## 2024-04-05 DIAGNOSIS — I10 Essential (primary) hypertension: Secondary | ICD-10-CM

## 2024-04-05 DIAGNOSIS — E782 Mixed hyperlipidemia: Secondary | ICD-10-CM

## 2024-04-05 DIAGNOSIS — F419 Anxiety disorder, unspecified: Secondary | ICD-10-CM | POA: Diagnosis not present

## 2024-04-05 DIAGNOSIS — K219 Gastro-esophageal reflux disease without esophagitis: Secondary | ICD-10-CM

## 2024-04-05 HISTORY — DX: Other fatigue: R53.83

## 2024-04-05 HISTORY — DX: Major depressive disorder, recurrent, moderate: F33.1

## 2024-04-05 MED ORDER — SERTRALINE HCL 50 MG PO TABS: 50.0000 mg | ORAL_TABLET | Freq: Every day | ORAL | 2 refills | Status: DC

## 2024-04-05 NOTE — Assessment & Plan Note (Signed)
 Experiencing exacerbation of depressive and anxiety symptoms due to recent significant life stressors including the death of her sister-in-law and mother. Reports feeling overwhelmed, exhausted, and unable to grieve properly. Experiences difficulty sleeping and lack of motivation. Current Zoloft  dose is 25 mg, which is considered a low dose. - Increase Zoloft  to 50 mg at night. - Recommend melatonin for sleep difficulties. - Encourage finding a therapist or grief counselor, possibly through hospice or church groups.

## 2024-04-05 NOTE — Assessment & Plan Note (Signed)
 Recent lipid panel from April 2025 reviewed shows well-controlled levels with total cholesterol 143, HDL 65, LDL 52 and triglycerides 153. Continue current dose of atorvastatin  40 mg once daily.

## 2024-04-05 NOTE — Assessment & Plan Note (Signed)
 Chronic kidney disease stage 3a.  - Order blood work to check kidney function.

## 2024-04-05 NOTE — Assessment & Plan Note (Signed)
 Increased the dose of zoloft  to 50 mg daily

## 2024-04-05 NOTE — Assessment & Plan Note (Signed)
 Well-controlled blood pressures. Now on Amlodipine  dose to 2.5 mg once daily given well-controlled blood pressures. Target below 130/80 mmHg.

## 2024-04-05 NOTE — Progress Notes (Signed)
 Subjective:  Patient ID: Kathy Perry, female    DOB: 27-Nov-1960  Age: 63 y.o. MRN: [id number]  Chief Complaint  Patient presents with   Medical Management of Chronic Issues    HPI: Discussed the use of AI scribe software for clinical note transcription with the patient, who gave verbal consent to proceed.  Discussed the use of AI scribe software for clinical note transcription with the patient, who gave verbal consent to proceed.  History of Present Illness   Kathy Perry is a 63 year old female who presents with a persistent funky smell in her nose and emotional distress.  Olfactory disturbance - Persistent intermittent 'funky smell' in the nose for the past couple of weeks - Sensation similar to previous infection - No pain in the cheekbones - Occasional slight burning in the throat, reminiscent of past strep throat episodes  Emotional distress and fatigue - Significant emotional distress and exhaustion - Feeling overwhelmed by recent life events, including the death of her sister-in-law and delayed notification of her mother's passing - Guilt regarding inability to grieve properly for her sister-in-law and mother - Troubled by focus on external events rather than personal losses - Difficulty sleeping - Waking feeling drained - Lack of motivation  Antidepressant therapy - On Zoloft  25 mg at night for years - Concerned about increasing the dose due to prior experiences of feeling unfocused when taking it during the day  Functional limitations and social stressors - Worried about ability to travel alone, causing tension with family - Daughter assists with online tasks - Not actively attending church but maintains belief in God            04/05/2024    1:38 PM 11/15/2023   10:42 AM 08/30/2023    8:35 AM 03/07/2023    1:41 PM 11/15/2022   11:13 AM  Depression screen PHQ 2/9  Decreased Interest 2 0 0 0 0  Down, Depressed, Hopeless 1 0 1 1 1   PHQ - 2 Score  3 0 1 1 1   Altered sleeping 3  2 1  0  Tired, decreased energy 2  2 3 3   Change in appetite 2  2 3  0  Feeling bad or failure about yourself  0  0 1 1  Trouble concentrating 1  0 0 0  Moving slowly or fidgety/restless 0  0 0 1  Suicidal thoughts 0  0 0 0  PHQ-9 Score 11  7 9 6   Difficult doing work/chores Not difficult at all  Not difficult at all Not difficult at all Somewhat difficult        04/05/2024    1:38 PM  Fall Risk   Falls in the past year? 0  Number falls in past yr: 0  Injury with Fall? 0  Risk for fall due to : No Fall Risks  Follow up Falls evaluation completed    Patient Care Team: Sherre Clapper, MD as PCP - General (Family Medicine) Charls Pearla LABOR, MD (Inactive) as PCP - Cardiology (Cardiology) Harvey Margo CROME, MD (Inactive) as Consulting Physician (Gastroenterology) Cindie Carlin POUR, DO as Consulting Physician (Internal Medicine)   Review of Systems  Constitutional:  Negative for appetite change, fatigue and fever.  HENT:  Negative for congestion, ear pain, sinus pressure and sore throat.   Respiratory:  Negative for cough, chest tightness, shortness of breath and wheezing.   Cardiovascular:  Negative for chest pain and palpitations.  Gastrointestinal:  Negative for abdominal pain, constipation, diarrhea, nausea  and vomiting.  Genitourinary:  Negative for dysuria and hematuria.  Musculoskeletal:  Negative for arthralgias, back pain, joint swelling and myalgias.  Skin:  Negative for rash.  Neurological:  Negative for dizziness, weakness and headaches.  Psychiatric/Behavioral:  Positive for dysphoric mood and sleep disturbance. The patient is not nervous/anxious.     Current Outpatient Medications on File Prior to Visit  Medication Sig Dispense Refill   amLODipine  (NORVASC ) 2.5 MG tablet Take 1 tablet (2.5 mg total) by mouth daily. 90 tablet 3   Aspirin-Acetaminophen -Caffeine (GOODYS EXTRA STRENGTH) 500-325-65 MG PACK Take 1 Package by mouth daily as  needed (pain).     atorvastatin  (LIPITOR) 40 MG tablet Take 1 tablet (40 mg total) by mouth daily. 90 tablet 1   celecoxib (CELEBREX) 200 MG capsule Take 200 mg by mouth 2 (two) times daily.     GOODSENSE PAIN RELIEF 325 MG tablet Take 975 mg by mouth 3 (three) times daily as needed.     pantoprazole  (PROTONIX ) 40 MG tablet TAKE ONE TABLET BY MOUTH TWICE DAILY before a meal 180 tablet 0   VITAMIN D , ERGOCALCIFEROL , PO Take 125 mcg by mouth daily.     No current facility-administered medications on file prior to visit.   Past Medical History:  Diagnosis Date   Anxiety    Arthritis    GERD (gastroesophageal reflux disease)    IBS (irritable bowel syndrome)    Need for Tdap vaccination 11/13/2019   Past Surgical History:  Procedure Laterality Date   BALLOON DILATION N/A 06/23/2021   Procedure: BALLOON DILATION;  Surgeon: Cindie Carlin POUR, DO;  Location: [facility];  Service: Endoscopy;  Laterality: N/A;   BIOPSY  06/23/2021   Procedure: BIOPSY;  Surgeon: Cindie Carlin POUR, DO;  Location: [facility];  Service: Endoscopy;;   BREAST EXCISIONAL BIOPSY     BREAST LUMPECTOMY WITH RADIOACTIVE SEED LOCALIZATION Left 02/28/2020   Procedure: LEFT BREAST LUMPECTOMY WITH RADIOACTIVE SEED LOCALIZATION;  Surgeon: Belinda Cough, MD;  Location: [facility];  Service: General;  Laterality: Left;  LMA   ESOPHAGOGASTRODUODENOSCOPY (EGD) WITH PROPOFOL  N/A 06/23/2021   Procedure: ESOPHAGOGASTRODUODENOSCOPY (EGD) WITH PROPOFOL ;  Surgeon: Cindie Carlin POUR, DO;  Location: [facility];  Service: Endoscopy;  Laterality: N/A;   KNEE ARTHROSCOPY Left 12/24/2022   REPLACEMENT TOTAL KNEE Right    TOTAL KNEE ARTHROPLASTY Right 12/08/2023   TUBAL LIGATION     WISDOM TOOTH EXTRACTION      Family History  Problem Relation Age of Onset   Breast cancer Paternal Grandmother    Diabetes Father    Cancer Father        not sure primary   Memory loss Mother    Cancer Brother        thyroid     COPD Brother    Breast cancer Daughter    Breast cancer Paternal Aunt    Lung cancer Paternal Aunt    Colon cancer Neg Hx    Esophageal cancer Neg Hx    Gastric cancer Neg Hx    Social History   Socioeconomic History   Marital status: Married    Spouse name: Actor   Number of children: 3   Years of education: Not on file   Highest education level: 9th grade  Occupational History   Occupation: homemaker  Tobacco Use   Smoking status: Never   Smokeless tobacco: Never  Vaping Use   Vaping status: Never Used  Substance and Sexual Activity   Alcohol use:  No   Drug use: Never   Sexual activity: Not Currently    Birth control/protection: Surgical, Post-menopausal    Comment: tubal  Other Topics Concern   Not on file  Social History Narrative   Lives with [facility] years   1 son, 2 daughters   4 grandchildren       Cat: Lucy   Dog: Roofus       Enjoys: walking with dog, flowers, sewing, iron       Diet: eats all food groups-limited meat    Caffeine: some coke    Water: 2-4 cups of water      Wears seat belt   Smoke detectors at home    Social Drivers of Health   Financial Resource Strain: Low Risk  (08/30/2023)   Overall Financial Resource Strain (CARDIA)    Difficulty of Paying Living Expenses: Not hard at all  Food Insecurity: No Food Insecurity (08/30/2023)   Hunger Vital Sign    Worried About Running Out of Food in the Last Year: Never true    Ran Out of Food in the Last Year: Never true  Transportation Needs: No Transportation Needs (08/30/2023)   PRAPARE - Administrator, Civil Service (Medical): No    Lack of Transportation (Non-Medical): No  Physical Activity: Insufficiently Active (08/30/2023)   Exercise Vital Sign    Days of Exercise per Week: 7 days    Minutes of Exercise per Session: 20 min  Stress: No Stress Concern Present (08/30/2023)   [employer] of Occupational Health - Occupational Stress Questionnaire    Feeling of  Stress : Only a little  Social Connections: Moderately Isolated (08/30/2023)   Social Connection and Isolation Panel    Frequency of Communication with Friends and Family: More than three times a week    Frequency of Social Gatherings with Friends and Family: Once a week    Attends Religious Services: Never    Database administrator or Organizations: No    Attends Engineer, structural: Never    Marital Status: Married    Objective:  BP 110/82 (BP Location: Left Arm, Patient Position: Sitting)   Pulse 76   Temp (!) 97.5 F (36.4 C) (Temporal)   Ht 5' 5.5 (1.664 m)   Wt 155 lb (70.3 kg)   LMP  (LMP Unknown)   SpO2 98%   BMI 25.40 kg/m      04/05/2024    1:37 PM 02/02/2024    1:45 PM 01/03/2024    1:16 PM  BP/Weight  Systolic BP 110 110 92  Diastolic BP 82 70 62  Wt. (Lbs) 155 155 155  BMI 25.4 kg/m2 25.4 kg/m2 25.4 kg/m2    Physical Exam Vitals and nursing note reviewed.  HENT:     Nose: No congestion or rhinorrhea.  Eyes:     Pupils: Pupils are equal, round, and reactive to light.  Cardiovascular:     Rate and Rhythm: Normal rate and regular rhythm.  Pulmonary:     Effort: Pulmonary effort is normal.     Breath sounds: Normal breath sounds.  Musculoskeletal:        General: Normal range of motion.     Cervical back: Normal range of motion.  Skin:    General: Skin is warm.  Neurological:     General: No focal deficit present.  Psychiatric:        Mood and Affect: Mood normal.  Lab Results  Component Value Date   WBC 5.0 10/18/2023   HGB 12.5 10/18/2023   HCT 39.3 10/18/2023   PLT 179 10/18/2023   GLUCOSE 84 10/18/2023   CHOL 143 10/18/2023   TRIG 153 (H) 10/18/2023   HDL 65 10/18/2023   LDLCALC 52 10/18/2023   ALT 11 10/18/2023   AST 16 10/18/2023   NA 143 10/18/2023   K 4.5 10/18/2023   CL 105 10/18/2023   CREATININE 1.18 (H) 10/18/2023   BUN 17 10/18/2023   CO2 25 10/18/2023   TSH 0.765 11/15/2022   INR 0.9 10/18/2023    HGBA1C 5.4 11/25/2021      Assessment & Plan:  Primary hypertension Assessment & Plan: Well-controlled blood pressures. Now on Amlodipine  dose to 2.5 mg once daily given well-controlled blood pressures. Target below 130/80 mmHg.    Mixed hyperlipidemia Assessment & Plan: Recent lipid panel from April 2025 reviewed shows well-controlled levels with total cholesterol 143, HDL 65, LDL 52 and triglycerides 153. Continue current dose of atorvastatin  40 mg once daily.   Orders: -     Lipid panel  Anxiety Assessment & Plan: Increased the dose of zoloft  to 50 mg daily  Orders: -     Sertraline  HCl; Take 1 tablet (50 mg total) by mouth at bedtime.  Dispense: 90 tablet; Refill: 2  Gastroesophageal reflux disease, unspecified whether esophagitis present Assessment & Plan: The current medical regimen is effective;  continue present plan and medications. Continue pantoprazole  40 mg twice daily.    Stage 3a chronic kidney disease (HCC) Assessment & Plan: Chronic kidney disease stage 3a.  - Order blood work to check kidney function.   Other fatigue Assessment & Plan: Could be multifactorial. Will check labs Increased dose of zoloft  to 50 mg  Advised to seek grief counselor  Orders: -     CBC with Differential/Platelet -     Comprehensive metabolic panel with GFR -     TSH  Major depressive disorder, recurrent episode, moderate (HCC) Assessment & Plan: Experiencing exacerbation of depressive and anxiety symptoms due to recent significant life stressors including the death of her sister-in-law and mother. Reports feeling overwhelmed, exhausted, and unable to grieve properly. Experiences difficulty sleeping and lack of motivation. Current Zoloft  dose is 25 mg, which is considered a low dose. - Increase Zoloft  to 50 mg at night. - Recommend melatonin for sleep difficulties. - Encourage finding a therapist or grief counselor, possibly through hospice or church groups.       Body mass index is 25.4 kg/m.   Nasal and throat symptoms Reports a funky smell in the nose and occasional throat burning sensation. No redness or sores observed in the throat. Symptoms may be related to allergies. - Consider over-the-counter nasal saline drops for symptom relief.           Meds ordered this encounter  Medications   sertraline  (ZOLOFT ) 50 MG tablet    Sig: Take 1 tablet (50 mg total) by mouth at bedtime.    Dispense:  90 tablet    Refill:  2    Dose increased to 50 mg daily    Orders Placed This Encounter  Procedures   CBC with Differential/Platelet   Comprehensive metabolic panel with GFR   TSH   Lipid panel       Follow-up: No follow-ups on file.    An After Visit Summary was printed and given to the patient.  Kayveon Lennartz, MD [facility] [phone]

## 2024-04-05 NOTE — Assessment & Plan Note (Signed)
The current medical regimen is effective;  continue present plan and medications. Continue pantoprazole 40 mg twice daily.  

## 2024-04-05 NOTE — Assessment & Plan Note (Signed)
 Could be multifactorial. Will check labs Increased dose of zoloft  to 50 mg  Advised to seek grief counselor

## 2024-04-06 ENCOUNTER — Ambulatory Visit: Payer: Self-pay

## 2024-04-06 LAB — COMPREHENSIVE METABOLIC PANEL WITH GFR
ALT: 24 IU/L (ref 0–32)
AST: 26 IU/L (ref 0–40)
Albumin: 4.7 g/dL (ref 3.9–4.9)
Alkaline Phosphatase: 109 IU/L (ref 49–135)
BUN/Creatinine Ratio: 15 (ref 12–28)
BUN: 19 mg/dL (ref 8–27)
Bilirubin Total: 0.3 mg/dL (ref 0.0–1.2)
CO2: 25 mmol/L (ref 20–29)
Calcium: 9.9 mg/dL (ref 8.7–10.3)
Chloride: 102 mmol/L (ref 96–106)
Creatinine, Ser: 1.29 mg/dL — ABNORMAL HIGH (ref 0.57–1.00)
Globulin, Total: 2.3 g/dL (ref 1.5–4.5)
Glucose: 96 mg/dL (ref 70–99)
Potassium: 5 mmol/L (ref 3.5–5.2)
Sodium: 141 mmol/L (ref 134–144)
Total Protein: 7 g/dL (ref 6.0–8.5)
eGFR: 47 mL/min/1.73 — ABNORMAL LOW (ref >59)

## 2024-04-06 LAB — TSH: TSH: 1.17 u[IU]/mL (ref 0.450–4.500)

## 2024-04-06 LAB — LIPID PANEL
Chol/HDL Ratio: 2.7 ratio (ref 0.0–4.4)
Cholesterol, Total: 156 mg/dL (ref 100–199)
HDL: 57 mg/dL (ref >39)
LDL Chol Calc (NIH): 77 mg/dL (ref 0–99)
Triglycerides: 126 mg/dL (ref 0–149)
VLDL Cholesterol Cal: 22 mg/dL (ref 5–40)

## 2024-04-06 LAB — CBC WITH DIFFERENTIAL/PLATELET
Basophils Absolute: 0.1 x10E3/uL (ref 0.0–0.2)
Basos: 2 %
EOS (ABSOLUTE): 0.1 x10E3/uL (ref 0.0–0.4)
Eos: 2 %
Hematocrit: 42.5 % (ref 34.0–46.6)
Hemoglobin: 13.4 g/dL (ref 11.1–15.9)
Immature Grans (Abs): 0 x10E3/uL (ref 0.0–0.1)
Immature Granulocytes: 0 %
Lymphocytes Absolute: 1.6 x10E3/uL (ref 0.7–3.1)
Lymphs: 30 %
MCH: 27.2 pg (ref 26.6–33.0)
MCHC: 31.5 g/dL (ref 31.5–35.7)
MCV: 86 fL (ref 79–97)
Monocytes Absolute: 0.4 x10E3/uL (ref 0.1–0.9)
Monocytes: 8 %
Neutrophils Absolute: 3.1 x10E3/uL (ref 1.4–7.0)
Neutrophils: 58 %
Platelets: 158 x10E3/uL (ref 150–450)
RBC: 4.93 x10E6/uL (ref 3.77–5.28)
RDW: 13.1 % (ref 11.7–15.4)
WBC: 5.4 x10E3/uL (ref 3.4–10.8)

## 2024-05-18 ENCOUNTER — Other Ambulatory Visit: Payer: Self-pay | Admitting: Family Medicine

## 2024-05-18 DIAGNOSIS — F419 Anxiety disorder, unspecified: Secondary | ICD-10-CM

## 2024-05-30 DIAGNOSIS — R002 Palpitations: Secondary | ICD-10-CM

## 2024-06-05 ENCOUNTER — Other Ambulatory Visit: Payer: Self-pay

## 2024-06-05 MED ORDER — METOPROLOL SUCCINATE ER 25 MG PO TB24: 25.0000 mg | ORAL_TABLET | Freq: Every day | ORAL | 3 refills | Status: AC

## 2024-06-21 ENCOUNTER — Ambulatory Visit: Payer: Self-pay

## 2024-06-21 NOTE — Telephone Encounter (Signed)
 Patient's husband states that diarrhea is not all the time and she is not dehydrated. She is stable drinking enough fluids. She will keep the appointment on 06/28/24.

## 2024-06-21 NOTE — Telephone Encounter (Signed)
 FYI Only or Action Required?: FYI only for provider: appointment scheduled on 12/11.  Patient was last seen in primary care on 04/05/2024 by Sirivol, Mamatha, MD.  Called Nurse Triage reporting Diarrhea.  Symptoms began several weeks ago.  Interventions attempted: Rest, hydration, or home remedies.  Symptoms are: gradually worsening.  Triage Disposition: See PCP Within 2 Weeks  Patient/caregiver understands and will follow disposition?: Yes  Message from Antwanette L sent at 06/21/2024  3:05 PM EST  Reason for Triage: Kathy Perry, the patient husband is calling b/c the patient is having uncontrollable diarrhea. The patient  doesn't have a vehicle and can only come on Thursday. Please follow up with Kathy Perry at [phone]   Reason for Disposition  Diarrhea is a chronic symptom (recurrent or ongoing AND present > 4 weeks)  Answer Assessment - Initial Assessment Questions Patient with remote hx of IBS presenting with a few weeks of intermittent diarrhea episodes. She will get some stomach pains prior to having diarrhea that resolves post BM.  Denies dehydration, ABX use, new medications, new diet changes, CP or SOB. Husband thinks her IBS may be back and flared.   Due to transportation issues- pt requesting Thursday appt. Dec 11th at 0820 with PCP. Asking if anyone cancels could they reach out so they can come in a little later in the morning. Ed/UC precautions understood.   1. DIARRHEA SEVERITY: How bad is the diarrhea? How many more stools have you had in the past 24 hours than normal?      Once or twice a day but not every day  2. ONSET: When did the diarrhea begin?      A few weeks off and on 3. STOOL DESCRIPTION:  How loose or watery is the diarrhea? What is the stool color? Is there any blood or mucous in the stool?     Loose/watery, denies blood  4. VOMITING: Are you also vomiting? If Yes, ask: How many times in the past 24 hours?      Denies  5. ABDOMEN PAIN: Are  you having any abdomen pain? If Yes, ask: What does it feel like? (e.g., crampy, dull, intermittent, constant)      Stomach hurts some prior to episodes  6. ABDOMEN PAIN SEVERITY: If present, ask: How bad is the pain?  (e.g., Scale 1-10; mild, moderate, or severe)     Mild-moderate pain  7. ORAL INTAKE: If vomiting, Have you been able to drink liquids? How much liquids have you had in the past 24 hours?     adequate 8. HYDRATION: Any signs of dehydration? (e.g., dry mouth [not just dry lips], too weak to stand, dizziness, new weight loss) When did you last urinate?     Always carries a drink with her  9. EXPOSURE: Have you traveled to a foreign country recently? Have you been exposed to anyone with diarrhea? Could you have eaten any food that was spoiled?     Denies exposure  10. ANTIBIOTIC USE: Are you taking antibiotics now or have you taken antibiotics in the past 2 months?       denies 11. OTHER SYMPTOMS: Do you have any other symptoms? (e.g., fever, blood in stool)       denies  Protocols used: Diarrhea-A-AH

## 2024-06-25 ENCOUNTER — Telehealth: Payer: Self-pay

## 2024-06-25 DIAGNOSIS — I4729 Other ventricular tachycardia: Secondary | ICD-10-CM

## 2024-06-25 DIAGNOSIS — I471 Supraventricular tachycardia, unspecified: Secondary | ICD-10-CM

## 2024-06-25 NOTE — Telephone Encounter (Signed)
 Husband French) called to follow-up on orders and scheduling for Lexiscan.  Husband stated patient will need to have test done on a Thursday.

## 2024-06-25 NOTE — Telephone Encounter (Signed)
 Advised that someone will call to schedule but that [facility] can only do test on Tuesday or Wednesday. Pt states that he will work to get a ride rather than go to [place] [facility].

## 2024-06-26 ENCOUNTER — Telehealth: Payer: Self-pay | Admitting: *Deleted

## 2024-06-26 NOTE — Telephone Encounter (Signed)
 Pt given instructions for stress test. Instructions also sent via my chart per pt request.

## 2024-06-28 ENCOUNTER — Ambulatory Visit

## 2024-06-28 VITALS — BP 104/86 | HR 71 | Temp 97.7°F | Ht 65.5 in | Wt 161.0 lb

## 2024-06-28 DIAGNOSIS — K582 Mixed irritable bowel syndrome: Secondary | ICD-10-CM

## 2024-06-28 DIAGNOSIS — N1831 Chronic kidney disease, stage 3a: Secondary | ICD-10-CM

## 2024-06-28 DIAGNOSIS — R159 Full incontinence of feces: Secondary | ICD-10-CM | POA: Diagnosis not present

## 2024-06-28 DIAGNOSIS — F321 Major depressive disorder, single episode, moderate: Secondary | ICD-10-CM | POA: Diagnosis not present

## 2024-06-28 HISTORY — DX: Full incontinence of feces: R15.9

## 2024-06-28 NOTE — Assessment & Plan Note (Signed)
 Orders:    Ambulatory referral to Gastroenterology

## 2024-06-28 NOTE — Progress Notes (Signed)
 Acute Office Visit  Subjective:    Patient ID: Kathy Perry, female    DOB: Sep 17, 1960, 63 y.o.   MRN: [id number]  No chief complaint on file.   HPI:  Discussed the use of AI scribe software for clinical note transcription with the patient, who gave verbal consent to proceed.  History of Present Illness   Kathy Perry is a 63 year old female with irritable bowel syndrome who presents with worsening bowel symptoms and fecal incontinence.  Diarrhea and fecal incontinence - Worsening episodes of explosive diarrhea over the past four months - Diarrhea occurs without warning and is often associated with fecal incontinence - Episodes are not preceded by stomach pain but are associated with a sudden urge in the rectal area - Occasional sensation of fullness and mild abdominal pain, but no severe cramping - Recent slight normalization of bowel movements, but persistent difficulty with complete evacuation requiring excessive wiping - Occasional presence of mucus in stool  Bowel habits and dietary triggers - History of irritable bowel syndrome - Familiar with dietary triggers and avoids combinations such as cheese and chocolate - Takes fiber supplements regularly  Colorectal cancer screening - No history of colonoscopy - Completed Cologuard test three years ago  Urinary symptoms - No urinary incontinence or difficulty with holding urine  Hydration and fluid intake - Low water intake due to stomach upset with increased fluid consumption  Psychological stress and medication concerns - Increased stress related to family circumstances, including daughter's husband's recent stroke - Prescribed sertraline  (Zoloft ) 50 mg but takes only 25 mg due to concerns about effects on mental state  Gastroesophageal symptoms - Takes Protonix  for acid reflux - Denies heartburn or burping  Renal history - History of kidney disease, stable for many years        Past Medical  History:  Diagnosis Date   Anxiety    Arthritis    GERD (gastroesophageal reflux disease)    IBS (irritable bowel syndrome)    Need for Tdap vaccination 11/13/2019    Past Surgical History:  Procedure Laterality Date   BALLOON DILATION N/A 06/23/2021   Procedure: BALLOON DILATION;  Surgeon: Cindie Carlin POUR, DO;  Location: [facility];  Service: Endoscopy;  Laterality: N/A;   BIOPSY  06/23/2021   Procedure: BIOPSY;  Surgeon: Cindie Carlin POUR, DO;  Location: [facility];  Service: Endoscopy;;   BREAST EXCISIONAL BIOPSY     BREAST LUMPECTOMY WITH RADIOACTIVE SEED LOCALIZATION Left 02/28/2020   Procedure: LEFT BREAST LUMPECTOMY WITH RADIOACTIVE SEED LOCALIZATION;  Surgeon: Belinda Cough, MD;  Location: [facility];  Service: General;  Laterality: Left;  LMA   ESOPHAGOGASTRODUODENOSCOPY (EGD) WITH PROPOFOL  N/A 06/23/2021   Procedure: ESOPHAGOGASTRODUODENOSCOPY (EGD) WITH PROPOFOL ;  Surgeon: Cindie Carlin POUR, DO;  Location: [facility];  Service: Endoscopy;  Laterality: N/A;   KNEE ARTHROSCOPY Left 12/24/2022   REPLACEMENT TOTAL KNEE Right    TOTAL KNEE ARTHROPLASTY Right 12/08/2023   TUBAL LIGATION     WISDOM TOOTH EXTRACTION      Family History  Problem Relation Age of Onset   Breast cancer Paternal Grandmother    Diabetes Father    Cancer Father        not sure primary   Memory loss Mother    Cancer Brother        thyroid    COPD Brother    Breast cancer Daughter    Breast cancer Paternal Aunt    Lung cancer Paternal Aunt  Colon cancer Neg Hx    Esophageal cancer Neg Hx    Gastric cancer Neg Hx     Social History   Socioeconomic History   Marital status: Married    Spouse name: Actor   Number of children: 3   Years of education: Not on file   Highest education level: 9th grade  Occupational History   Occupation: homemaker  Tobacco Use   Smoking status: Never   Smokeless tobacco: Never  Vaping Use   Vaping status: Never Used  Substance  and Sexual Activity   Alcohol use: No   Drug use: Never   Sexual activity: Not Currently    Birth control/protection: Surgical, Post-menopausal    Comment: tubal  Other Topics Concern   Not on file  Social History Narrative   Lives with [place] [address] years   1 son, 2 daughters   4 grandchildren       Cat: Lucy   Dog: Roofus       Enjoys: walking with dog, flowers, sewing, iron       Diet: eats all food groups-limited meat    Caffeine: some coke    Water: 2-4 cups of water      Wears seat belt   Smoke detectors at home    Social Drivers of Health   Tobacco Use: Low Risk (06/28/2024)   Patient History    Smoking Tobacco Use: Never    Smokeless Tobacco Use: Never    Passive Exposure: Not on file  Financial Resource Strain: Low Risk (08/30/2023)   Overall Financial Resource Strain (CARDIA)    Difficulty of Paying Living Expenses: Not hard at all  Food Insecurity: No Food Insecurity (08/30/2023)   Hunger Vital Sign    Worried About Running Out of Food in the Last Year: Never true    Ran Out of Food in the Last Year: Never true  Transportation Needs: No Transportation Needs (08/30/2023)   PRAPARE - Administrator, Civil Service (Medical): No    Lack of Transportation (Non-Medical): No  Physical Activity: Insufficiently Active (08/30/2023)   Exercise Vital Sign    Days of Exercise per Week: 7 days    Minutes of Exercise per Session: 20 min  Stress: No Stress Concern Present (08/30/2023)   [employer] of Occupational Health - Occupational Stress Questionnaire    Feeling of Stress : Only a little  Social Connections: Moderately Isolated (08/30/2023)   Social Connection and Isolation Panel    Frequency of Communication with Friends and Family: More than three times a week    Frequency of Social Gatherings with Friends and Family: Once a week    Attends Religious Services: Never    Database Administrator or Organizations: No    Attends Banker  Meetings: Never    Marital Status: Married  Catering Manager Violence: Not At Risk (08/30/2023)   Humiliation, Afraid, Rape, and Kick questionnaire    Fear of Current or Ex-Partner: No    Emotionally Abused: No    Physically Abused: No    Sexually Abused: No  Depression (PHQ2-9): Low Risk (06/28/2024)   Depression (PHQ2-9)    PHQ-2 Score: 0  Recent Concern: Depression (PHQ2-9) - High Risk (04/05/2024)   Depression (PHQ2-9)    PHQ-2 Score: 11  Alcohol Screen: Low Risk (08/30/2023)   Alcohol Screen    Last Alcohol Screening Score (AUDIT): 0  Housing: Low Risk (08/30/2023)   Housing Stability Vital Sign  Unable to Pay for Housing in the Last Year: No    Number of Times Moved in the Last Year: 0    Homeless in the Last Year: No  Utilities: Not At Risk (08/30/2023)   [facility] Utilities    Threatened with loss of utilities: No  Health Literacy: Adequate Health Literacy (08/30/2023)   B1300 Health Literacy    Frequency of need for help with medical instructions: Never    Outpatient Medications Prior to Visit  Medication Sig Dispense Refill   amLODipine  (NORVASC ) 2.5 MG tablet Take 1 tablet (2.5 mg total) by mouth daily. 90 tablet 3   Aspirin-Acetaminophen -Caffeine (GOODYS EXTRA STRENGTH) 500-325-65 MG PACK Take 1 Package by mouth daily as needed (pain).     atorvastatin  (LIPITOR) 40 MG tablet Take 1 tablet (40 mg total) by mouth daily. 90 tablet 1   GOODSENSE PAIN RELIEF 325 MG tablet Take 975 mg by mouth 3 (three) times daily as needed.     metoprolol  succinate (TOPROL  XL) 25 MG 24 hr tablet Take 1 tablet (25 mg total) by mouth daily. 90 tablet 3   pantoprazole  (PROTONIX ) 40 MG tablet TAKE ONE TABLET BY MOUTH TWICE DAILY before a meal 180 tablet 0   sertraline  (ZOLOFT ) 50 MG tablet Take 1 tablet (50 mg total) by mouth daily. 30 tablet 3   VITAMIN D , ERGOCALCIFEROL , PO Take 125 mcg by mouth daily.     celecoxib (CELEBREX) 200 MG capsule Take 200 mg by mouth 2 (two) times daily.      sertraline  (ZOLOFT ) 50 MG tablet Take 1 tablet (50 mg total) by mouth at bedtime. 90 tablet 2   No facility-administered medications prior to visit.    Allergies[1]  Review of Systems  Constitutional:  Negative for chills, fatigue and fever.  HENT:  Negative for congestion, ear pain, sinus pressure and sore throat.   Respiratory:  Negative for cough and shortness of breath.   Cardiovascular:  Negative for chest pain.  Gastrointestinal:  Positive for diarrhea. Negative for abdominal pain, constipation, nausea and vomiting.       Fecal incontinence, explosive diarrhea  Genitourinary:  Negative for dysuria and frequency.  Musculoskeletal:  Negative for arthralgias, back pain and myalgias.  Neurological:  Negative for dizziness and headaches.  Psychiatric/Behavioral:  Negative for dysphoric mood. The patient is not nervous/anxious.        Objective:        06/28/2024    8:16 AM 04/05/2024    1:37 PM 02/02/2024    1:45 PM  Vitals with BMI  Height 5' 5.5 5' 5.5 5' 5.5  Weight 161 lbs 155 lbs 155 lbs  BMI 26.37 25.39 25.39  Systolic 104 110 889  Diastolic 86 82 70  Pulse 71 76 77    No data found.   Physical Exam Vitals and nursing note reviewed.  Constitutional:      Appearance: Normal appearance.  HENT:     Head: Normocephalic and atraumatic.     Mouth/Throat:     Mouth: Mucous membranes are dry.  Cardiovascular:     Rate and Rhythm: Normal rate and regular rhythm.  Pulmonary:     Effort: Pulmonary effort is normal.     Breath sounds: Normal breath sounds.  Abdominal:     Palpations: There is no mass.     Tenderness: There is no abdominal tenderness.     Comments: Hyperactive bowel sounds  Skin:    General: Skin is warm.  Neurological:  General: No focal deficit present.     Mental Status: She is alert.  Psychiatric:        Mood and Affect: Mood normal.     There are no preventive care reminders to display for this patient.   There are no  preventive care reminders to display for this patient.   Lab Results  Component Value Date   TSH 1.170 04/05/2024   Lab Results  Component Value Date   WBC 5.4 04/05/2024   HGB 13.4 04/05/2024   HCT 42.5 04/05/2024   MCV 86 04/05/2024   PLT 158 04/05/2024   Lab Results  Component Value Date   NA 141 04/05/2024   K 5.0 04/05/2024   CO2 25 04/05/2024   GLUCOSE 96 04/05/2024   BUN 19 04/05/2024   CREATININE 1.29 (H) 04/05/2024   BILITOT 0.3 04/05/2024   ALKPHOS 109 04/05/2024   AST 26 04/05/2024   ALT 24 04/05/2024   PROT 7.0 04/05/2024   ALBUMIN 4.7 04/05/2024   CALCIUM  9.9 04/05/2024   ANIONGAP 13 12/13/2018   EGFR 47 (L) 04/05/2024   Lab Results  Component Value Date   CHOL 156 04/05/2024   Lab Results  Component Value Date   HDL 57 04/05/2024   Lab Results  Component Value Date   LDLCALC 77 04/05/2024   Lab Results  Component Value Date   TRIG 126 04/05/2024   Lab Results  Component Value Date   CHOLHDL 2.7 04/05/2024   Lab Results  Component Value Date   HGBA1C 5.4 11/25/2021        Results for orders placed or performed in visit on 04/05/24  CBC with Differential/Platelet   Collection Time: 04/05/24  2:14 PM  Result Value Ref Range   WBC 5.4 3.4 - 10.8 x10E3/uL   RBC 4.93 3.77 - 5.28 x10E6/uL   Hemoglobin 13.4 11.1 - 15.9 g/dL   Hematocrit 57.4 65.9 - 46.6 %   MCV 86 79 - 97 fL   MCH 27.2 26.6 - 33.0 pg   MCHC 31.5 31.5 - 35.7 g/dL   RDW 86.8 88.2 - 84.5 %   Platelets 158 150 - 450 x10E3/uL   Neutrophils 58 Not Estab. %   Lymphs 30 Not Estab. %   Monocytes 8 Not Estab. %   Eos 2 Not Estab. %   Basos 2 Not Estab. %   Neutrophils Absolute 3.1 1.4 - 7.0 x10E3/uL   Lymphocytes Absolute 1.6 0.7 - 3.1 x10E3/uL   Monocytes Absolute 0.4 0.1 - 0.9 x10E3/uL   EOS (ABSOLUTE) 0.1 0.0 - 0.4 x10E3/uL   Basophils Absolute 0.1 0.0 - 0.2 x10E3/uL   Immature Granulocytes 0 Not Estab. %   Immature Grans (Abs) 0.0 0.0 - 0.1 x10E3/uL  Comprehensive  metabolic panel with GFR   Collection Time: 04/05/24  2:14 PM  Result Value Ref Range   Glucose 96 70 - 99 mg/dL   BUN 19 8 - 27 mg/dL   Creatinine, Ser 8.70 (H) 0.57 - 1.00 mg/dL   eGFR 47 (L) >40 fO/fpw/8.26   BUN/Creatinine Ratio 15 12 - 28   Sodium 141 134 - 144 mmol/L   Potassium 5.0 3.5 - 5.2 mmol/L   Chloride 102 96 - 106 mmol/L   CO2 25 20 - 29 mmol/L   Calcium  9.9 8.7 - 10.3 mg/dL   Total Protein 7.0 6.0 - 8.5 g/dL   Albumin 4.7 3.9 - 4.9 g/dL   Globulin, Total 2.3 1.5 - 4.5 g/dL   Bilirubin  Total 0.3 0.0 - 1.2 mg/dL   Alkaline Phosphatase 109 49 - 135 IU/L   AST 26 0 - 40 IU/L   ALT 24 0 - 32 IU/L  TSH   Collection Time: 04/05/24  2:14 PM  Result Value Ref Range   TSH 1.170 0.450 - 4.500 uIU/mL  Lipid panel   Collection Time: 04/05/24  2:14 PM  Result Value Ref Range   Cholesterol, Total 156 100 - 199 mg/dL   Triglycerides 873 0 - 149 mg/dL   HDL 57 >60 mg/dL   VLDL Cholesterol Cal 22 5 - 40 mg/dL   LDL Chol Calc (NIH) 77 0 - 99 mg/dL   Chol/HDL Ratio 2.7 0.0 - 4.4 ratio     Assessment & Plan:   Assessment & Plan Incontinence of feces, unspecified fecal incontinence type  Orders:   Ambulatory referral to Gastroenterology  Irritable bowel syndrome with both constipation and diarrhea Irritable bowel syndrome with fecal incontinence Chronic irritable bowel syndrome with recent exacerbation characterized by explosive diarrhea and fecal incontinence. Symptoms have persisted for approximately four months. No recent colonoscopy, but previous colon guard test was performed. Differential includes sphincter dysfunction or IBS exacerbation. No severe pain or blood in stool. Hyperactive bowel sounds on examination. - Referred to gastroenterology for further evaluation and possible colonoscopy. - Advised to increase water intake and continue fiber supplementation. - Instructed on pelvic floor exercises to strengthen sphincter control. - Advised to avoid foods that  trigger symptoms. IF NO RELIEF, MAY NEED A COLORECTAL SURGERY REFERRAL FOR ANAL MANOMETRY AND INTERVENTION. Orders:   Ambulatory referral to Gastroenterology  Depression, major, single episode, moderate (HCC) Major depressive disorder, single episode, moderate Moderate major depressive disorder with current stressors including family health issues. Currently taking sertraline  (Zoloft ) 25 mg, which is below the prescribed 50 mg. Concerns about medication effects on mental state. Stress and anxiety may be contributing to gastrointestinal symptoms. - Encouraged to take prescribed 50 mg of sertraline  as it is not a high dose and may help manage symptoms but will leave it up to her. I hope she does not have increased diarrhea if she increases the sertraline  dose. - Discussed the importance of medication adherence for managing depression and stress.    Stage 3a chronic kidney disease (HCC) Chronic kidney disease, stage 3a Chronic kidney disease stage 3a with well-managed kidney function over the years. Recent blood work in September showed stable kidney function. - Will schedule follow-up appointment in two months for repeat blood work to monitor kidney function.       Body mass index is 26.38 kg/m.SABRA  General health maintenance Discussion of flu vaccination and general health maintenance. - Ensure flu vaccination is up to date.          No orders of the defined types were placed in this encounter.   Orders Placed This Encounter  Procedures   Ambulatory referral to Gastroenterology     Follow-up: Return in about 8 weeks (around 08/23/2024) for chronic disease follow up.  An After Visit Summary was printed and given to the patient.  Acquanetta Cabanilla, MD [facility] [phone]     [1]  Allergies Allergen Reactions   Codeine Other (See Comments)    Chest pain   Lisinopril  Cough

## 2024-06-28 NOTE — Assessment & Plan Note (Signed)
 Chronic kidney disease, stage 3a Chronic kidney disease stage 3a with well-managed kidney function over the years. Recent blood work in September showed stable kidney function. - Will schedule follow-up appointment in two months for repeat blood work to monitor kidney function.

## 2024-06-28 NOTE — Assessment & Plan Note (Signed)
 Irritable bowel syndrome with fecal incontinence Chronic irritable bowel syndrome with recent exacerbation characterized by explosive diarrhea and fecal incontinence. Symptoms have persisted for approximately four months. No recent colonoscopy, but previous colon guard test was performed. Differential includes sphincter dysfunction or IBS exacerbation. No severe pain or blood in stool. Hyperactive bowel sounds on examination. - Referred to gastroenterology for further evaluation and possible colonoscopy. - Advised to increase water intake and continue fiber supplementation. - Instructed on pelvic floor exercises to strengthen sphincter control. - Advised to avoid foods that trigger symptoms. IF NO RELIEF, MAY NEED A COLORECTAL SURGERY REFERRAL FOR ANAL MANOMETRY AND INTERVENTION. Orders:   Ambulatory referral to Gastroenterology

## 2024-06-28 NOTE — Assessment & Plan Note (Signed)
 Major depressive disorder, single episode, moderate Moderate major depressive disorder with current stressors including family health issues. Currently taking sertraline  (Zoloft ) 25 mg, which is below the prescribed 50 mg. Concerns about medication effects on mental state. Stress and anxiety may be contributing to gastrointestinal symptoms. - Encouraged to take prescribed 50 mg of sertraline  as it is not a high dose and may help manage symptoms but will leave it up to her. I hope she does not have increased diarrhea if she increases the sertraline  dose. - Discussed the importance of medication adherence for managing depression and stress.

## 2024-07-02 ENCOUNTER — Other Ambulatory Visit: Payer: Self-pay | Admitting: Family Medicine

## 2024-07-02 DIAGNOSIS — K219 Gastro-esophageal reflux disease without esophagitis: Secondary | ICD-10-CM

## 2024-07-03 ENCOUNTER — Ambulatory Visit

## 2024-07-06 ENCOUNTER — Ambulatory Visit

## 2024-07-10 ENCOUNTER — Telehealth: Payer: Self-pay | Admitting: *Deleted

## 2024-07-10 NOTE — Telephone Encounter (Signed)
 Pt given instructions for stress test.

## 2024-07-16 ENCOUNTER — Other Ambulatory Visit: Payer: Self-pay

## 2024-07-16 MED ORDER — ATORVASTATIN CALCIUM 40 MG PO TABS: 40.0000 mg | ORAL_TABLET | Freq: Every day | ORAL | 1 refills | Status: AC

## 2024-07-18 ENCOUNTER — Ambulatory Visit

## 2024-07-18 DIAGNOSIS — I471 Supraventricular tachycardia, unspecified: Secondary | ICD-10-CM | POA: Diagnosis present

## 2024-07-18 DIAGNOSIS — I4729 Other ventricular tachycardia: Secondary | ICD-10-CM | POA: Diagnosis present

## 2024-07-18 MED ORDER — REGADENOSON 0.4 MG/5ML IV SOLN
0.4000 mg | Freq: Once | INTRAVENOUS | Status: AC
  Administered 2024-07-18: 0.4 mg via INTRAVENOUS

## 2024-07-18 MED ORDER — TECHNETIUM TC 99M TETROFOSMIN IV KIT
10.5000 | PACK | Freq: Once | INTRAVENOUS | Status: AC | PRN
  Administered 2024-07-18: 10.5 via INTRAVENOUS

## 2024-07-18 MED ORDER — TECHNETIUM TC 99M TETROFOSMIN IV KIT
30.1000 | PACK | Freq: Once | INTRAVENOUS | Status: AC | PRN
  Administered 2024-07-18: 30.1 via INTRAVENOUS

## 2024-07-20 LAB — MYOCARDIAL PERFUSION IMAGING
LV dias vol: 91 mL (ref 46–106)
LV sys vol: 31 mL
Nuc Stress EF: 66 %
Peak HR: 91 {beats}/min
Rest HR: 67 {beats}/min
Rest Nuclear Isotope Dose: 10.5 mCi
SDS: 4
SRS: 3
SSS: 7
ST Depression (mm): 0 mm
Stress Nuclear Isotope Dose: 30.1 mCi
TID: 1.02

## 2024-07-26 ENCOUNTER — Ambulatory Visit

## 2024-07-26 VITALS — BP 114/62 | HR 62 | Ht 65.5 in | Wt 161.0 lb

## 2024-07-26 DIAGNOSIS — I4729 Other ventricular tachycardia: Secondary | ICD-10-CM | POA: Diagnosis present

## 2024-07-26 DIAGNOSIS — I340 Nonrheumatic mitral (valve) insufficiency: Secondary | ICD-10-CM | POA: Diagnosis present

## 2024-07-26 DIAGNOSIS — K2970 Gastritis, unspecified, without bleeding: Secondary | ICD-10-CM | POA: Insufficient documentation

## 2024-07-26 DIAGNOSIS — E782 Mixed hyperlipidemia: Secondary | ICD-10-CM | POA: Diagnosis present

## 2024-07-26 DIAGNOSIS — I10 Essential (primary) hypertension: Secondary | ICD-10-CM | POA: Insufficient documentation

## 2024-07-26 DIAGNOSIS — K2950 Unspecified chronic gastritis without bleeding: Secondary | ICD-10-CM | POA: Diagnosis present

## 2024-07-26 NOTE — Progress Notes (Signed)
 "  Cardiology Consultation:    Date:  07/26/2024   ID:  Kathy Perry, DOB Jul 24, 1960, MRN [id number]  PCP:  Sirivol, Mamatha, MD  Cardiologist:  Alean JONELLE Kobus, MD   Referring MD: Sirivol, Mamatha, MD   No chief complaint on file.    ASSESSMENT AND PLAN:   Ms. Pillars 64 year old woman hypertension, hyperlipidemia, mild MR, mild aortic insufficiency, underwent right knee total arthroplasty in May 2025, CKD stage III.  Chronic gastritis, previously followed up with gastroenterology currently pending reestablishing care. Excess caffeine consumption [3 cans of Coke per day; 2-3 bottles of Goody powder].  Was seen for symptoms of palpitations. Echocardiogram completed 02/23/2024 notes normal biventricular function with LVEF 60 to 65%, grade 1 diastolic dysfunction, mild MR and mild aortic insufficiency, no significant change in comparison to previous echocardiogram from 2019. Heart monitor 14-day study 02/02/2024 noted predominantly sinus rhythm average heart rate 72/min [ranging from 51 to 142 bpm].  Rare ventricular and supraventricular ectopy burden less than 1%.  2 short runs of NSVT with the longest episode lasting 10 beats.  12 short runs of SVT with the longest episode lasting 22 seconds.  No high-grade AV blocks or pauses or sustained arrhythmias. Further evaluated with Lexiscan  stress with nuclear imaging 07/18/2024 noted no evidence of ischemia, gated SPECT images LVEF 66%.  Problem List Items Addressed This Visit       Cardiovascular and Mediastinum   Primary hypertension   Well-controlled. Target blood pressure below 130/80 mmHg Continue amlodipine  2.5 mg once daily.  Recommended continued monitoring at home and if blood pressures consistently below 120/80 mmHg may consider weaning off amlodipine .  Next      Mitral regurgitation   Stable findings on echocardiogram August 2025 in comparison to July 2019. Continue to monitor.  Tentatively repeat echocardiogram in about  2 to 3 years.      NSVT (nonsustained ventricular tachycardia) (HCC) - Primary   2 short runs of NSVT with the longest episode 10 beats on Zio patch monitor July 2025. Stress as nuclear imaging with Lexiscan  07/18/2024 no ischemia. No significant structural and functional issues on echocardiogram 02/23/2024.  Remains asymptomatic. Patient triggered events on Zio patch correlated with isolated PVCs. Reassuring, no medications indicated at this time.        Digestive   Gastritis   sReports chronic history of gastritis. Does have significant caffeine intake [multiple Coke cans a day] along with aspirin intake in the form of Goody's powders.  Risk for gastritis due to this reviewed. Recommended cutting back. she is in the process of establishing care with gastroenterologist.           Other   Hyperlipidemia   Lipid panel from 04/05/2024 noted total cholesterol 156, triglycerides 126, HDL 57 and LDL 77.   Continue current atorvastatin  40 mg daily.       Return to clinic tentatively 9 months.   History of Present Illness:    Kathy Perry is a 64 y.o. female who is being seen today for follow-up visit. PCP is Sirivol, Mamatha, MD. Last visit with me in the office was 02/02/2024.  Lives with her husband.  Here for follow-up and other results.  History of hypertension, hyperlipidemia, mild MR, mild aortic insufficiency, underwent right knee total arthroplasty in May 2025, CKD stage III.  Chronic gastritis, previously followed up with gastroenterology currently pending reestablishing care. Excess caffeine consumption [3 cans of Coke per day; 2-3 bottles of Goody powder]. Was seen for symptoms of palpitations.  Echocardiogram completed 02/23/2024 notes normal biventricular function with LVEF 60 to 65%, grade 1 diastolic dysfunction, mild MR and mild aortic insufficiency, no significant change in comparison to previous echocardiogram from 2019. Heart monitor 14-day study  02/02/2024 noted predominantly sinus rhythm average heart rate 72/min [ranging from 51 to 142 bpm].  Rare ventricular and supraventricular ectopy burden less than 1%.  2 short runs of NSVT with the longest episode lasting 10 beats.  12 short runs of SVT with the longest episode lasting 22 seconds.  No high-grade AV blocks or pauses or sustained arrhythmias. Further evaluated with Lexiscan  stress with nuclear imaging 07/18/2024 noted no evidence of ischemia, gated SPECT images LVEF 66%.  Lipid panel from 04/05/2024 noted total cholesterol 156, triglycerides 126, HDL 57 and LDL 77.  LDL slightly increased in comparison to April 2025 from 52. Thyroid  panel TSH normal 1.1. BUN 19 creatinine 1.29, eGFR 47 [appears stable chronic kidney disease stage III]. Normal transaminases and alkaline phosphatase. Hemoglobin 13.4 and hematocrit 42.5.  Ambulates well. Mentions overall doing well. Denies any chest pain no shortness of breath no more than 1 paroxysmal nocturnal dyspnea. No pedal edema. At times does feel tired. Has aches and pains in the body mostly musculoskeletal.  Uses Goody's powders 2-3 times a day.  Past Medical History:  Diagnosis Date   Anxiety    Arthritis    Benign paroxysmal positional vertigo of right ear 01/30/2019   Chronic chest pain with low to moderate risk for CAD 01/03/2024   Chronic mid back pain 03/07/2023   CKD (chronic kidney disease) stage 3, GFR 30-59 ml/min (HCC) 11/24/2021   Depression, major, single episode, moderate (HCC) 04/01/2020   Fecal occult blood test positive 09/08/2021   09/08/21 sent 3 hemoccult cards home to do     Gastroesophageal reflux disease    Hyperlipidemia 12/25/2020   Incontinence of feces 06/28/2024   Irritable bowel syndrome with both constipation and diarrhea    Major depressive disorder, recurrent episode, moderate (HCC) 04/05/2024   Mitral regurgitation 02/02/2024   Need for Tdap vaccination 11/13/2019   Other fatigue 04/05/2024    Overweight (BMI 25.0-29.9) 11/13/2019   Palpitations 02/02/2024   Postmenopause 09/08/2021   Postnasal drip 03/26/2021   Primary hypertension 05/26/2020   Primary osteoarthritis of right knee 10/18/2023   SUI (stress urinary incontinence, female) 09/08/2021   Vitamin D  deficiency 05/26/2020    Past Surgical History:  Procedure Laterality Date   BALLOON DILATION N/A 06/23/2021   Procedure: BALLOON DILATION;  Surgeon: Cindie Carlin POUR, DO;  Location: [facility];  Service: Endoscopy;  Laterality: N/A;   BIOPSY  06/23/2021   Procedure: BIOPSY;  Surgeon: Cindie Carlin POUR, DO;  Location: [facility];  Service: Endoscopy;;   BREAST EXCISIONAL BIOPSY     BREAST LUMPECTOMY WITH RADIOACTIVE SEED LOCALIZATION Left 02/28/2020   Procedure: LEFT BREAST LUMPECTOMY WITH RADIOACTIVE SEED LOCALIZATION;  Surgeon: Belinda Cough, MD;  Location: [facility];  Service: General;  Laterality: Left;  LMA   ESOPHAGOGASTRODUODENOSCOPY (EGD) WITH PROPOFOL  N/A 06/23/2021   Procedure: ESOPHAGOGASTRODUODENOSCOPY (EGD) WITH PROPOFOL ;  Surgeon: Cindie Carlin POUR, DO;  Location: [facility];  Service: Endoscopy;  Laterality: N/A;   KNEE ARTHROSCOPY Left 12/24/2022   REPLACEMENT TOTAL KNEE Right    TOTAL KNEE ARTHROPLASTY Right 12/08/2023   TUBAL LIGATION     WISDOM TOOTH EXTRACTION      Current Medications: Active Medications[1]   Allergies:   Codeine and Lisinopril    Social History  Socioeconomic History   Marital status: Married    Spouse name: Riva   Number of children: 3   Years of education: Not on file   Highest education level: 9th grade  Occupational History   Occupation: homemaker  Tobacco Use   Smoking status: Never   Smokeless tobacco: Never  Vaping Use   Vaping status: Never Used  Substance and Sexual Activity   Alcohol use: No   Drug use: Never   Sexual activity: Not Currently    Birth control/protection: Surgical, Post-menopausal    Comment: tubal  Other  Topics Concern   Not on file  Social History Narrative   Lives with [facility] 41 years   1 son, 2 daughters   4 grandchildren       Cat: Lucy   Dog: Roofus       Enjoys: walking with dog, flowers, sewing, iron       Diet: eats all food groups-limited meat    Caffeine: some coke    Water: 2-4 cups of water      Wears seat belt   Smoke detectors at home    Social Drivers of Health   Tobacco Use: Low Risk (07/26/2024)   Patient History    Smoking Tobacco Use: Never    Smokeless Tobacco Use: Never    Passive Exposure: Not on file  Financial Resource Strain: Low Risk (08/30/2023)   Overall Financial Resource Strain (CARDIA)    Difficulty of Paying Living Expenses: Not hard at all  Food Insecurity: No Food Insecurity (08/30/2023)   Hunger Vital Sign    Worried About Running Out of Food in the Last Year: Never true    Ran Out of Food in the Last Year: Never true  Transportation Needs: No Transportation Needs (08/30/2023)   PRAPARE - Administrator, Civil Service (Medical): No    Lack of Transportation (Non-Medical): No  Physical Activity: Insufficiently Active (08/30/2023)   Exercise Vital Sign    Days of Exercise per Week: 7 days    Minutes of Exercise per Session: 20 min  Stress: No Stress Concern Present (08/30/2023)   [employer] of Occupational Health - Occupational Stress Questionnaire    Feeling of Stress : Only a little  Social Connections: Moderately Isolated (08/30/2023)   Social Connection and Isolation Panel    Frequency of Communication with Friends and Family: More than three times a week    Frequency of Social Gatherings with Friends and Family: Once a week    Attends Religious Services: Never    Database Administrator or Organizations: No    Attends Banker Meetings: Never    Marital Status: Married  Depression (PHQ2-9): Low Risk (06/28/2024)   Depression (PHQ2-9)    PHQ-2 Score: 0  Recent Concern: Depression (PHQ2-9) - High  Risk (04/05/2024)   Depression (PHQ2-9)    PHQ-2 Score: 11  Alcohol Screen: Low Risk (08/30/2023)   Alcohol Screen    Last Alcohol Screening Score (AUDIT): 0  Housing: Low Risk (08/30/2023)   Housing Stability Vital Sign    Unable to Pay for Housing in the Last Year: No    Number of Times Moved in the Last Year: 0    Homeless in the Last Year: No  Utilities: Not At Risk (08/30/2023)   AHC Utilities    Threatened with loss of utilities: No  Health Literacy: Adequate Health Literacy (08/30/2023)   B1300 Health Literacy    Frequency of need for  help with medical instructions: Never     Family History: The patient's family history includes Breast cancer in her daughter, paternal aunt, and paternal grandmother; COPD in her brother; Cancer in her brother and father; Diabetes in her father; Lung cancer in her paternal aunt; Memory loss in her mother. There is no history of Colon cancer, Esophageal cancer, or Gastric cancer. ROS:   Please see the history of present illness.    All 14 point review of systems negative except as described per history of present illness.  EKGs/Labs/Other Studies Reviewed:    The following studies were reviewed today:   EKG:       Recent Labs: 04/05/2024: ALT 24; BUN 19; Creatinine, Ser 1.29; Hemoglobin 13.4; Platelets 158; Potassium 5.0; Sodium 141; TSH 1.170  Recent Lipid Panel    Component Value Date/Time   CHOL 156 04/05/2024 1414   TRIG 126 04/05/2024 1414   HDL 57 04/05/2024 1414   CHOLHDL 2.7 04/05/2024 1414   LDLCALC 77 04/05/2024 1414    Physical Exam:    VS:  BP 114/62   Pulse 62   Ht 5' 5.5 (1.664 m)   Wt 161 lb (73 kg)   LMP  (LMP Unknown)   SpO2 95%   BMI 26.38 kg/m     Wt Readings from Last 3 Encounters:  07/26/24 161 lb (73 kg)  07/18/24 161 lb (73 kg)  06/28/24 161 lb (73 kg)     GENERAL:  Well nourished, well developed in no acute distress NECK: No JVD CARDIAC: RRR, S1 and S2 present, no murmurs, no rubs, no  gallops Extremities: No pitting pedal edema. Pulses bilaterally symmetric with radial 2+ and dorsalis pedis 2+ NEUROLOGIC:  Alert and oriented x 3  Medication Adjustments/Labs and Tests Ordered: Current medicines are reviewed at length with the patient today.  Concerns regarding medicines are outlined above.  No orders of the defined types were placed in this encounter.  No orders of the defined types were placed in this encounter.   Signed, Alean reddy Chloeanne Poteet, MD, MPH, [facility] 07/26/2024 3:18 PM    [facility]    [1]  Current Meds  Medication Sig   amLODipine  (NORVASC ) 2.5 MG tablet Take 1 tablet (2.5 mg total) by mouth daily.   Aspirin-Acetaminophen -Caffeine (GOODYS EXTRA STRENGTH) 500-325-65 MG PACK Take 1 Package by mouth daily as needed (pain).   atorvastatin  (LIPITOR) 40 MG tablet Take 1 tablet (40 mg total) by mouth daily.   metoprolol  succinate (TOPROL  XL) 25 MG 24 hr tablet Take 1 tablet (25 mg total) by mouth daily.   pantoprazole  (PROTONIX ) 40 MG tablet TAKE ONE TABLET BY MOUTH TWICE DAILY before a meal   sertraline  (ZOLOFT ) 50 MG tablet Take 1 tablet (50 mg total) by mouth daily.   VITAMIN D , ERGOCALCIFEROL , PO Take 125 mcg by mouth daily.   "

## 2024-07-26 NOTE — Assessment & Plan Note (Signed)
 Lipid panel from 04/05/2024 noted total cholesterol 156, triglycerides 126, HDL 57 and LDL 77.   Continue current atorvastatin  40 mg daily.

## 2024-07-26 NOTE — Assessment & Plan Note (Addendum)
 2 short runs of NSVT with the longest episode 10 beats on Zio patch monitor July 2025. Stress as nuclear imaging with Lexiscan  07/18/2024 no ischemia. No significant structural and functional issues on echocardiogram 02/23/2024.  Remains asymptomatic. Patient triggered events on Zio patch correlated with isolated PVCs. Reassuring, no medications indicated at this time.

## 2024-07-26 NOTE — Assessment & Plan Note (Addendum)
 Well-controlled. Target blood pressure below 130/80 mmHg Continue amlodipine  2.5 mg once daily.  Recommended continued monitoring at home and if blood pressures consistently below 120/80 mmHg may consider weaning off amlodipine .  Next

## 2024-07-26 NOTE — Assessment & Plan Note (Addendum)
 sReports chronic history of gastritis. Does have significant caffeine intake [multiple Coke cans a day] along with aspirin intake in the form of Goody's powders.  Risk for gastritis due to this reviewed. Recommended cutting back. she is in the process of establishing care with gastroenterologist.

## 2024-07-26 NOTE — Assessment & Plan Note (Signed)
 Stable findings on echocardiogram August 2025 in comparison to July 2019. Continue to monitor.  Tentatively repeat echocardiogram in about 2 to 3 years.

## 2024-07-26 NOTE — Patient Instructions (Addendum)
 Medication Instructions:  Your physician recommends that you continue on your current medications as directed. Please refer to the Current Medication list given to you today.  *If you need a refill on your cardiac medications before your next appointment, please call your pharmacy*   Lab Work: None Ordered If you have labs (blood work) drawn today and your tests are completely normal, you will receive your results only by: MyChart Message (if you have MyChart) OR A paper copy in the mail If you have any lab test that is abnormal or we need to change your treatment, we will call you to review the results.   Testing/Procedures: None Ordered   Follow-Up: At [facility], you and your health needs are our priority.  As part of our continuing mission to provide you with exceptional heart care, we have created designated Provider Care Teams.  These Care Teams include your primary Cardiologist (physician) and Advanced Practice Providers (APPs -  Physician Assistants and Nurse Practitioners) who all work together to provide you with the care you need, when you need it.  We recommend signing up for the patient portal called MyChart.  Sign up information is provided on this After Visit Summary.  MyChart is used to connect with patients for Virtual Visits (Telemedicine).  Patients are able to view lab/test results, encounter notes, upcoming appointments, etc.  Non-urgent messages can be sent to your provider as well.   To learn more about what you can do with MyChart, go to [email or web address].    Your next appointment:   9 month(s)  The format for your next appointment:   In Person  Provider:   Alean Kobus, MD   Other Instructions NA

## 2024-08-10 ENCOUNTER — Telehealth: Payer: Self-pay

## 2024-08-10 NOTE — Telephone Encounter (Signed)
 Copied from CRM #[id number]. Topic: Referral - Question >> Aug 10, 2024  8:50 AM Willma SAUNDERS wrote: Reason for CRM: Patients husband Carlin calling to see if she can be referred to the same gastro as he is instead of going to [facility]. Advised would be happy to send a request for it, but he is unable to provide any information other than a last name. Will get the information and call back to submit the request. >> Aug 10, 2024  8:59 AM Lonell PEDLAR wrote: Patient would like to be referred to  [facility] misenheimer gi with [facility] [address] [place], [place] [address]

## 2024-08-30 ENCOUNTER — Ambulatory Visit
